# Patient Record
Sex: Male | Born: 1950
Health system: Southern US, Community
[De-identification: ages and names within clinical notes are randomized; demographics above are authoritative.]

## PROBLEM LIST (undated history)

## (undated) DIAGNOSIS — M1990 Unspecified osteoarthritis, unspecified site: Secondary | ICD-10-CM

## (undated) DIAGNOSIS — I639 Cerebral infarction, unspecified: Secondary | ICD-10-CM

## (undated) DIAGNOSIS — E785 Hyperlipidemia, unspecified: Secondary | ICD-10-CM

## (undated) DIAGNOSIS — H409 Unspecified glaucoma: Secondary | ICD-10-CM

## (undated) HISTORY — PX: BACK SURGERY: SHX140

## (undated) HISTORY — PX: VASECTOMY: SHX75

## (undated) HISTORY — DX: Cerebral infarction, unspecified: I63.9

## (undated) HISTORY — DX: Hyperlipidemia, unspecified: E78.5

## (undated) HISTORY — PX: INGUINAL HERNIA REPAIR: SUR1180

---

## 2001-06-01 ENCOUNTER — Encounter: Payer: Self-pay | Admitting: Family Medicine

## 2001-06-01 ENCOUNTER — Ambulatory Visit (HOSPITAL_COMMUNITY): Admission: RE | Admit: 2001-06-01 | Discharge: 2001-06-01 | Payer: Self-pay | Admitting: Family Medicine

## 2001-08-08 ENCOUNTER — Encounter: Payer: Self-pay | Admitting: Emergency Medicine

## 2001-08-08 ENCOUNTER — Emergency Department (HOSPITAL_COMMUNITY): Admission: EM | Admit: 2001-08-08 | Discharge: 2001-08-08 | Payer: Self-pay | Admitting: Emergency Medicine

## 2007-09-20 ENCOUNTER — Ambulatory Visit (HOSPITAL_COMMUNITY): Admission: RE | Admit: 2007-09-20 | Discharge: 2007-09-20 | Payer: Self-pay | Admitting: General Surgery

## 2007-09-20 ENCOUNTER — Encounter (INDEPENDENT_AMBULATORY_CARE_PROVIDER_SITE_OTHER): Payer: Self-pay | Admitting: General Surgery

## 2007-10-04 ENCOUNTER — Ambulatory Visit (HOSPITAL_COMMUNITY): Admission: RE | Admit: 2007-10-04 | Discharge: 2007-10-04 | Payer: Self-pay | Admitting: General Surgery

## 2007-10-04 ENCOUNTER — Encounter (INDEPENDENT_AMBULATORY_CARE_PROVIDER_SITE_OTHER): Payer: Self-pay | Admitting: General Surgery

## 2010-07-30 NOTE — Op Note (Signed)
NAMEJAVARIAN, Ronnie Walker                 ACCOUNT NO.:  [id number]   MEDICAL RECORD NO.:  [id number]          PATIENT TYPE:  AMB   LOCATION:  DAY                           FACILITY:  [facility]   PHYSICIAN:  Tilford Pillar, MD      DATE OF BIRTH:  [birth year]   DATE OF PROCEDURE:  09/20/2007  DATE OF DISCHARGE:  09/20/2007                               OPERATIVE REPORT   PREOPERATIVE DIAGNOSES:  1. Right inguinal hernia.  2. Sebaceous cyst of the scalp.   POSTOPERATIVE DIAGNOSES:  1. Right inguinal hernia.  2. Sebaceous cyst of the scalp.  3. Right-sided hydrocele.   PROCEDURES:  1. Right inguinal hernia repair with mesh.  2. Hydrocelectomy.  3. Excision of a sebaceous cyst of the scalp with a 3-cm incision.   SURGEON:  Tilford Pillar, MD.   ANESTHESIA:  General endotracheal local anesthetic, 1% lidocaine plain.   SPECIMEN:  Sebaceous cyst of the scalp.   ESTIMATED BLOOD LOSS:  Minimal.   INDICATIONS:  The patient is a 60 year old presented to my office with  referral for 2 separate reasons, one being a sebaceous cyst of the scalp  that has been present for several years and is slowly increased in size  causing him notable nodule on the scalp and some local discomfort.  He  also noted increasing right inguinal hernia discomfort and a bulge in  this area.  He had also noted increased size of his right scrotum.  He  was evaluated __________sebaceous cyst and the right inguinal hernia.  Risks, benefits, and alternatives as well as care were discussed at  length with the patient, risk including but not limited to risk of  infection, bleeding, mesh infection, requiring removal of the mesh as  well as the possibility of recurrence of both the hernia as well as the  possibility of recurrence of the cyst were discussed at length with the  patient.  The questions and concerns were addressed, and the patient was  consented for the planned procedure.   OPERATION:  The patient was taken to the  operating room, was placed in a  supine position on the operating table, at which time he was given  general anesthetic.  When the patient was asleep, he was endotracheally  intubated by anesthesia.  At this point, his groin was prepped and  draped in usual fashion for hernia repair.  Skin incision was created on  the right groin with a scalpel.  This was dissected down to the  subcuticular tissue was carried out using electrocautery including the  division of Scarpa fascia.  On reaching the external oblique fascia, a  notable bulge was encountered and the fascia was scored with a scalpel  and then the external oblique fascia was opened medially towards the  external inguinal vein with Metzenbaum scissors, taking care not to  injure the ilioinguinal nerve during the dissection.  At this point, the  sizable mass was noted in the inguinal canal.  The cord structures were  freed from its surrounding canal with blunt mutual dissection by  creating a window behind  the cord structures.  It was noted that the  hernia sac appeared in the floor consistent with a direct hernia.  Based  on these findings, we continued to evaluate the cord structures to  evaluate the possibility of an indirect component and the dissection  continued.  I did continue down towards his scrotum where there was a  sizeable apparent mass.  This was suspected to be a continuation of a  possible indirect component of the hernia; however, with continued  dissection, I was able to deliver a large hydrocele in the right pelvic  floor into the field.  I did open the hydrocele at this point dividing  and freeing up tissue, which enabled me to continue with and then pex  the lining of the hydrocele back upon itself with a 4-0 chromic suture  in a running locking continuous fashion.  At this point, the testicle  was inspected, although it was free, it was clearly viable with an  excellent blood supply and this was replaced back into  the scrotum with  proper orientation, and at this time, I continued evaluation around the  cord structures.  I did not identify indirect hernia component,  therefore, I turned my attentions to repair of the direct hernia  component.  Using the mesh and plug, the __________ reduced the hernia.  This was placed over the reduced hernia.   It was packed in several positions with a 2-0 Novafil suture and then a  mesh overlay was placed using a 2-0 Novafil suture to secure this  medially above the tubercle, superiorly to the conjoint tendon,  inferiorly to the second portion of the Cooper ligament and the tails  were tucked medially beneath the external oblique fascia.  The old  defect was reapproximated using a 2-0 Novafil suture snug, this was  placed snugly around the cord which did not reduce any of the  vasculature close to this area.  At this point, the wound was irrigated.  A 2-0 Vicryl in a running continuous fashion was utilized to  reapproximate the external oblique fascia, local anesthetic was then  instilled and then a 3-0 Vicryl was utilized to reapproximate the skin  edges in the running continuous fashion, reapproximate the Scarpa fascia  in running continuous fashion and a 4-0 Monocryl was utilized to  reapproximate the skin edges in a running subcuticular suture.  Skin was  washed and dried with a moist and dry towel.  Benzoin was applied.  Half-  inch Steri-Strips were placed over the incision.  At this point, the  drapes were removed and the patient was repositioned and his scalp was  prepped with Betadine solution.  I regowned and regloved  at this point  and then using a scalpel, an elliptical incision over the cyst with  combination of sharp and bipolar dissection was utilized to dissect down  around the cyst.  This was removed in its entirety, did reach down to  just over the bone but no bony defect was identified.  At this point,  hemostasis was obtained using  electrocautery.  The wound was irrigated  and then a 3-0 Prolene suture was applied in a simple interrupted  fashion to reapproximate the skin edges.  Hemostasis was excellent.  Betadine ointment was placed over the suture line and a 2 x 2 gauze  dressing was placed over the incision and secured as best as possible  with a Medipore tape for temporary compression.  At this point, all  drapes were removed.  The patient was allowed to come out of the general  anesthetic.  He was transferred back to regular hospital bed and  transferred from Postanesthetic Care Unit in stable condition.  At the  conclusion of the procedure, all instrument, sponge, and needle counts  were correct.  The patient tolerated the procedure well.      Tilford Pillar, MD  Electronically Signed     BZ/MEDQ  D:  09/21/2007  T:  09/22/2007  Job:  [id number]   cc:   Lorin Picket A. Gerda Diss, MD  Fax: [phone]

## 2010-07-30 NOTE — H&P (Signed)
NAME:  Ronnie Walker, CRITE NO.:  [id number]   MEDICAL RECORD NO.:  [id number]          PATIENT TYPE:  AMB   LOCATION:  DAY                           FACILITY:  [facility]   PHYSICIAN:  Tilford Pillar, MD      DATE OF BIRTH:  Jan 21, 1951   DATE OF ADMISSION:  DATE OF DISCHARGE:  LH                              HISTORY & PHYSICAL   CHIEF COMPLAINT:  Status post scalp cyst.   HISTORY OF PRESENT ILLNESS:  Patient is a 60 year old male who presented  to my office status post recent right inguinal hernia repair and  excision of a scalp cyst, both these proceeding on September 20, 2007.  At  this point, he does still complain of some right inguinal regional pain  associated with his recent hernia repair.  He has had no drainage and no  significant scrotal swelling or ecchymosis.  He has had no complaints in  the region of the scalp cyst excision.  He has resumed a normal diet.  He has had no recent change in medical status.   Please see the patient's previous history and physical for his complete  past medical, past surgical, medications, and social history.   PAST MEDICAL:  None.   PAST SURGICAL HISTORY:  Low back surgery.   MEDICATIONS:  No current medications.   ALLERGIES:  No known drug allergies.   SOCIAL HISTORY:  No tobacco.  Alcohol approximately 12 drinks per week.  Occupation, works at [employer].  There is heavy lifting involved.   PERTINENT FAMILY HISTORY:  Coronary artery disease.   REVIEW OF SYSTEMS:  Please see the previous history and physical for  complete reference.  Review of systems is unremarkable on all body  systems except musculoskeletal with neck, back, and joint pain.   PHYSICAL EXAMINATION:  On physical exam, the patient is a healthy-  appearing male in no acute distress.  He is alert and oriented x3.  HEENT:  His scalp incision is healing well.  The Prolene sutures  persists.  There is no erythema or drainage noted in this area.  EYES:  Pupils were  equal, round, and reactive.  Extraocular movements  are intact.  No conjunctival pallor is noted.  NECK:  No cervical lymphadenopathy noted.  Trachea is midline.  ABDOMINAL EXAM:  Abdomen is soft.  Bowel sounds are present.  He is  tender along the right groin.  The incision is well-healed.  He does  have mild swelling in the right side of the scrotum.  There is no  ecchymosis noted over this area.  EXTREMITIES:  Warm and dry.   PERTINENT LABORATORY, RADIOGRAPHIC, AND PATHOLOGY RESULTS:  Pathologic  evaluation of his excised cyst from the scalp demonstrated trichilemmal  carcinoma with positive margins.  As per report, the cyst has a pilar  differentiation, but the cyst wall has degraded in some areas with  carcinomatous changes.  There is keratinization, which focally has a  trichilemmal appearance.  There is desmoplastic stroma with infiltration  of tumor cells between the mesenchymal elements.  The carcinomatous  component  does extend to the margins.   ASSESSMENT AND PLAN:  1. Carcinoma of the scalp.  2. Right inguinal hernia.  At this point, the patient does have a new      problem as the benign-appearing cyst did come back positive for      carcinoma, which was unexpected at the time of his initial cyst      excision.  Based on this, I did have a discussion with the patient      and the patient's wife in regards to these findings.  I did discuss      with the patient that this is an extremely rare form of carcinoma;      however, it does require additional obtaining of tissue due to the      positive margins.  Literature was also provided for the patient,      which had been retrieved from online and was discussed with the      patient that chemotherapy or radiation would be unlikely.  She      would proceed with excision for clear margins.  At this point, I      did discuss with the patient I feel there is little benefit for      obtaining frozen sections on this as this is on  the scalp.  There      is only limited region at this point for additional tissue.  On      evaluation in the office, there was enough playwith the surrounding      tissue that I feel additional tissue biopsy will be possible and      additional excision around the previous incision should provide      adequate margins, however, should the resulting path come back      again positive, at this point, the additional intervention will      likely require the assistance of a plastic surgeon for some form of      coverage over this area.  This was discussed with the patient and      at this point, we will plan to proceed with simple wide excision      with the understanding that should markings come back again      positive that referral to a plastic surgeon for additional tissue      and tissue coverage will be required.  The patient understands the      risks, benefits, and alternatives, and does wish to proceed at this      point.  We will schedule the patient as soon as possible for      additional tissue excision.       Tilford Pillar, MD  Electronically Signed     BZ/MEDQ  D:  09/28/2007  T:  09/29/2007  Job:  [id number]   cc:   Lorin Picket A. Gerda Diss, MD  Fax: [phone]   Short Stay Surgery

## 2010-07-30 NOTE — Op Note (Signed)
Ronnie Walker, Ronnie Walker                 ACCOUNT NO.:  [id number]   MEDICAL RECORD NO.:  [id number]          PATIENT TYPE:  AMB   LOCATION:  DAY                           FACILITY:  [facility]   PHYSICIAN:  Tilford Pillar, MD      DATE OF BIRTH:  06/10/1950   DATE OF PROCEDURE:  DATE OF DISCHARGE:                               OPERATIVE REPORT   PREOPERATIVE DIAGNOSIS:  In situ carcinoma of the positive margin.   POSTOPERATIVE DIAGNOSIS:  In situ carcinoma of the positive margin.   PROCEDURE:  Wide excision of scalp carcinoma via 1.5 cm incision, due to  elevation of a relaxing incision.   SURGEON:  Tilford Pillar, M.D.   ANESTHESIA:  Sensorcaine 1%  with epinephrine.   SPECIMENS:  Skin margin.   ESTIMATED BLOOD LOSS:  Less than 50 mL.   INDICATIONS:  The patient is an unfortunate 60 year old male who  previously had a what appeared a sebaceous cyst of the scalp, excised  and right inguinal hernia repair.  Both these operations went extremely  well with no difficulties.  No complications.  Following the repair and  the cyst excision, which was in the office, final pathology, which came  back positive for skin carcinoma.  This was discussed with the patient  at his outpatient visit the following week, and was discussed with the  patient the need for additional skin removals.  Due to the location, it  was discussed with the patient that the additional tissue to be  obtained.  However, if positive margins then came back positive,  additional skin removal would require likely flap or grafting for  coverage.  The patient understood the risk, benefits, and alternatives,  consent was obtained.   OPERATION:  The patient was taken to the operating room.  He was placed  in the supine position on the operating table.  General anesthetic was  administered.  Once the patient was asleep, he was endotracheally  intubated by anesthesia had been placed.  Scalp prepped with DuraPrep  solution.  He was  positioned into a somewhat seated position.  Sterile  dressing was placed in standard fashion.  The elliptical incision was  created around the previous placed Prolene sutures, this was  approximately 1.5 x 1.5 cm elliptical incision.  It was then taken down  just to the subcuticular tissue.  The tissue was removed and sent  specimen to Pathology.  Hemostasis obtained using electrocautery and  then additional local anesthetic was instilled.  Wound was irrigated.  Our attention was then turned to closure.   Using 3-0 nylon suture, the skin edges were reapproximated in a  __________ fashion.  In the midportion of the incision, there was a  quite a bit of tension, this is on the closure, therefore a relaxation  incision was created just lateral to the right of the incision through  the epidermis.  This allowed enough relaxation to precisely close the  surgical site at which time neosporin or antibiotic ointment was placed  over the suture line.  Over the relaxation incision, Dermabond was  placed  to create a seal.  Skin was then washed and dried with a moist  and dry towel.  Then  drapes were removed.  The patient was allowed to come out of general  anesthetic, was transferred back to the Post-Anesthetic Care Unit in  stable condition. At the conclusion of the procedure, all instrument,  sponge, and needle counts were correct. The patient tolerated the  procedure well.      Tilford Pillar, MD  Electronically Signed     BZ/MEDQ  D:  10/04/2007  T:  10/05/2007  Job:  [id number]   cc:   Lorin Picket A. Gerda Diss, MD  Fax: [phone]

## 2010-07-30 NOTE — H&P (Signed)
NAME:  Ronnie Walker, Ronnie Walker NO.:  [id number]   MEDICAL RECORD NO.:  [id number]          PATIENT TYPE:  AMB   LOCATION:  DAY                           FACILITY:  [facility]   PHYSICIAN:  Tilford Pillar, MD      DATE OF BIRTH:  26-Jan-1951   DATE OF ADMISSION:  DATE OF DISCHARGE:  LH                              HISTORY & PHYSICAL   CHIEF COMPLAINT:  Hernia and a cyst.   HISTORY OF PRESENT ILLNESS:  The patient is a 60 year old gentleman who  was referred to my office by Dr. Gerda Diss with two separate issues.  The  first issue of which has been present for sometime.  He has noted a  sizeable nodule on his scalp near the posterior aspect of his head,  several years, but has slowly increased in size.  He has never had any  drainage from this.  He has had no episodes of erythema.  No significant  pain.  It did not change significantly, he has never had any other  similar lesions.  The second issue, which he was referred was for  increasing right groin pain.  He has noted this approximately 3-4 months  ago and had increasing discomfort in the right groin with radiation down  to the right scrotum.  Based on this, the patient was evaluated by his  primary physician.  He did notice a large bulge in this area, which was  grown considerably in size.  He has had no erythema.  No severe constant  pain.  He has had no change in bowel habits.  No nausea and vomiting.  No fever and chills.  No melena.  No dysuria.  No history of prior  hernia.   PAST MEDICAL HISTORY:  None.   PAST SURGICAL HISTORY:  He has had lower back surgeries.   MEDICATIONS:  He is not currently on any medications.   ALLERGIES:  No known drug allergies.   SOCIAL HISTORY:  No tobacco.  He drinks approximately 12 beers per week.  Occupation:  He does work at [employer] and it does involve heavy  lifting.   FAMILY HISTORY:  Heart disease.   REVIEW OF SYSTEMS:  CONSTITUTIONAL:  Unremarkable.  EYES:   Unremarkable.  EARS, NOSE, AND THROAT:  Unremarkable.  RESPIRATORY:  Unremarkable.  CARDIOVASCULAR:  Unremarkable.  GASTROINTESTINAL:  Unremarkable.  GENITOURINARY:  Unremarkable.  MUSCULOSKELETAL:  Neck, back, and joint  arthralgias, otherwise unremarkable.  SKIN:  As per HPI, otherwise  unremarkable.  ENDOCRINE:  Unremarkable.  NEURO:  Unremarkable.   PHYSICAL EXAMINATION:  The patient is a calm individual in no acute  distress.  He is alert and oriented x3.  HEENT:  Scalp:  He does have a round palpable mobile nodule on his  scalp.  This is slightly to the left and posterior in the parietal  region and is nontender to palpation.  No discharges noted.  Does not  appear attached to the underlying muscle or skull.  Eyes, pupils equal,  round, and reactive.  Extraocular movements are intact.  No scleral  icterus  or conjunctival pallor is noted.  Trachea is midline.  No  cervical lymphadenopathy is appreciated.  PULMONARY:  Unlabored respirations.  He is clear to auscultation.  CARDIOVASCULAR:  Regular rate and rhythm.  No murmurs, no gallops.  He  has 2+ radial and dorsalis pedis pulses bilaterally.  ABDOMEN:  Bowel  sounds are present.  Abdomen is soft, nontender.  He does have a small  umbilical hernia.  This is easily reducible as well as an extremely  large right inguinal hernia.  This does have incarcerated hernia sac due  to the size.  This is likely a chronic finding.  This is all the way  into his scrotum and actually pushes towards the left side of the  scrotum.  He does have a distended left testicle which is easily  palpated, although no right testicle is palpable due to the presence of  the extremely large hernia.  In fact, the palpable testicle may actually  be his right testicle displaced to the left side.  Otherwise, normal  appearing external male genitalia.  SKIN:  Warm and dry.   ASSESSMENT AND PLAN:  1. Scalp sebaceous cyst.  2. Umbilical hernia.  3. Right inguinal  hernia, suspected chronic incarceration.  At this      point, I did discuss with the patient options.  I do not believe      that the sebaceous cyst is an emergency by any means.  However we      are going to proceed as recommended for the right inguinal hernia.      This may be something to approach at the same time with simple      excision.  The risks, benefits, and alternatives, which were      discussed at length with the patient and the patient's questions      and concerns were addressed in regard to this.  In regards to the      umbilical hernia, this is quite small and at this time we would      continue to keep a close eye on and monitor, although I would not      recommend at this time proceeding with repair unless a counter      incision is needed for the right inguinal hernia.  If a midline      infraumbilical skin incision is made for a counter incision for the      reduction and repair of the right inguinal hernia, I did discuss      with the patient that I would plan on repairing the umbilical      hernia at that time.  Additionally, in regards to the right      inguinal hernia due to its extremely large size, I do suspect this      is likely being present for several months at least.  I did discuss      with the patient at length the signs and symptoms associated with      strangulation and he is aware that should these symptoms occur he      is to proceed immediately to the emergency department.  At this      time, I do recommend urgent repair.  It was discussed with the      patient the risks, benefits, alternatives of a repair including,      but not limited to risk of bleeding, infection, mesh infection      requiring removal of mesh, as  well as possibility of bowel injury,      and as well as testicular loss.  Due to the large size, this does      increase the risk of paresthesia and nerve entrapment as well as      the possibility of disruption of the blood supply to  the right      testicle.  This will likely need a counter incision due to the      large size, and should any bowel viability be question, bowel      resection will take precedence over repair of the hernia which in      such a case I would plan utilizing a biological mesh rather than a      synthetic mesh for the repair with all anticipation of the need to      return in the future upon recovery for definitive repair of the      hernia.  However, at this time I did discuss with the patient at      length a right inguinal herniorrhaphy with mesh.  At this point, we      will schedule this at the patient's earliest convenience again      stressing that this is a relatively urgent procedure.  We will      proceed at this time.      Tilford Pillar, MD  Electronically Signed    BZ/MEDQ  D:  09/09/2007  T:  09/10/2007  Job:  [id number]   cc:   Lorin Picket A. Gerda Diss, MD  Fax: [phone]   Same Day Surgery

## 2010-12-12 LAB — BASIC METABOLIC PANEL
BUN: 7
CO2: 30
Calcium: 9.5
Chloride: 102
Creatinine, Ser: 0.91
GFR calc Af Amer: >60
GFR calc non Af Amer: >60
Glucose, Bld: 101 — ABNORMAL HIGH
Potassium: 4.3
Sodium: 138

## 2010-12-12 LAB — CBC
HCT: 43.3
Hemoglobin: 14.8
MCHC: 34.3
MCV: 89.9
Platelets: 191
RBC: 4.81
RDW: 12.7
WBC: 5.8

## 2013-01-11 ENCOUNTER — Encounter: Payer: Self-pay | Admitting: Family Medicine

## 2013-01-11 ENCOUNTER — Ambulatory Visit (INDEPENDENT_AMBULATORY_CARE_PROVIDER_SITE_OTHER): Payer: BC Managed Care – PPO | Admitting: Family Medicine

## 2013-01-11 VITALS — BP 120/84 | Temp 98.4°F | Ht 65.25 in | Wt 153.5 lb

## 2013-01-11 DIAGNOSIS — R109 Unspecified abdominal pain: Secondary | ICD-10-CM

## 2013-01-11 DIAGNOSIS — H9203 Otalgia, bilateral: Secondary | ICD-10-CM

## 2013-01-11 DIAGNOSIS — Z79899 Other long term (current) drug therapy: Secondary | ICD-10-CM

## 2013-01-11 DIAGNOSIS — H9209 Otalgia, unspecified ear: Secondary | ICD-10-CM

## 2013-01-11 DIAGNOSIS — Z125 Encounter for screening for malignant neoplasm of prostate: Secondary | ICD-10-CM

## 2013-01-11 DIAGNOSIS — E785 Hyperlipidemia, unspecified: Secondary | ICD-10-CM

## 2013-01-11 MED ORDER — NEOMYCIN-POLYMYXIN-HC 3.5-10000-1 OT SOLN: 3.0000 [drp] | Freq: Four times a day (QID) | OTIC | Status: AC

## 2013-01-11 NOTE — Progress Notes (Signed)
  Subjective:    Patient ID: Ronnie Walker, male    DOB: 31-Oct-1950, 62 y.o.   MRN: [id number]  Otalgia  There is pain in both ears. This is a new problem. The current episode started 1 to 4 weeks ago. The problem has been unchanged. There has been no fever. The pain is mild. He has tried nothing for the symptoms. The treatment provided no relief.  wears plugs at work Some sinus sx No fevers Has a history hyperlipidemia but denies any chest tightness pressure pain shortness of breath. Smoker-quit 10 years ago 30 pack year history at least Family history age 47 dad had ruptured aortic aneurysm. He  Review of Systems  HENT: Positive for ear pain.    see above. Also having to urinate at night    Objective:   Physical Exam Both your canals have cerumen buildup along with inflammation possible otitis externa neck no masses throat is normal lungs are clear no crackles heart regular abdomen soft no guarding or rebound extremities no edema       Assessment & Plan:  #1 patient has over 30-pack-year history of smoking although he did quit about 10 years ago he is having intermittent abdominal pain plus a family history of ruptured aortic aneurysm at age 19 I would recommend because of his abdominal discomforts ultrasound of his abdomen. He is going to do a colonoscopy I gave him the sheet to help get this set up. #2 has history hyperlipidemia we'll check lab work he needs followup for a wellness exam #3 otalgia-use drops as prescribed if ongoing troubles referral to ENT. Number for wellness exam in near future 25 minutes spent with patient.

## 2013-01-14 ENCOUNTER — Ambulatory Visit (HOSPITAL_COMMUNITY)
Admission: RE | Admit: 2013-01-14 | Discharge: 2013-01-14 | Disposition: A | Discharge status: Home / Self Care | Payer: BC Managed Care – PPO | Source: Ambulatory Visit | Attending: Family Medicine | Admitting: Family Medicine

## 2013-01-14 DIAGNOSIS — K7689 Other specified diseases of liver: Secondary | ICD-10-CM | POA: Insufficient documentation

## 2013-01-14 DIAGNOSIS — R109 Unspecified abdominal pain: Secondary | ICD-10-CM | POA: Insufficient documentation

## 2013-01-24 LAB — LIPID PANEL
Cholesterol: 243 mg/dL — ABNORMAL HIGH (ref 0–200)
HDL: 52 mg/dL (ref >39)
LDL Cholesterol: 165 mg/dL — ABNORMAL HIGH (ref 0–99)
Total CHOL/HDL Ratio: 4.7 Ratio
Triglycerides: 129 mg/dL (ref <150)
VLDL: 26 mg/dL (ref 0–40)

## 2013-01-24 LAB — HEPATIC FUNCTION PANEL
ALT: 19 U/L (ref 0–53)
AST: 15 U/L (ref 0–37)
Albumin: 3.9 g/dL (ref 3.5–5.2)
Alkaline Phosphatase: 77 U/L (ref 39–117)
Bilirubin, Direct: 0.1 mg/dL (ref 0.0–0.3)
Indirect Bilirubin: 0.3 mg/dL (ref 0.0–0.9)
Total Bilirubin: 0.4 mg/dL (ref 0.3–1.2)
Total Protein: 6.4 g/dL (ref 6.0–8.3)

## 2013-01-24 LAB — BASIC METABOLIC PANEL
BUN: 14 mg/dL (ref 6–23)
CO2: 29 mEq/L (ref 19–32)
Calcium: 8.8 mg/dL (ref 8.4–10.5)
Chloride: 102 mEq/L (ref 96–112)
Creat: 0.82 mg/dL (ref 0.50–1.35)
Glucose, Bld: 104 mg/dL — ABNORMAL HIGH (ref 70–99)
Potassium: 4.3 mEq/L (ref 3.5–5.3)
Sodium: 138 mEq/L (ref 135–145)

## 2013-01-25 LAB — PSA: PSA: 0.44 ng/mL (ref <=4.00)

## 2013-01-26 ENCOUNTER — Encounter: Payer: Self-pay | Admitting: Family Medicine

## 2013-02-04 ENCOUNTER — Encounter: Payer: Self-pay | Admitting: Family Medicine

## 2013-02-04 ENCOUNTER — Ambulatory Visit (INDEPENDENT_AMBULATORY_CARE_PROVIDER_SITE_OTHER): Payer: BC Managed Care – PPO | Admitting: Family Medicine

## 2013-02-04 ENCOUNTER — Ambulatory Visit (HOSPITAL_COMMUNITY)
Admission: RE | Admit: 2013-02-04 | Discharge: 2013-02-04 | Disposition: A | Discharge status: Home / Self Care | Payer: BC Managed Care – PPO | Source: Ambulatory Visit | Attending: Family Medicine | Admitting: Family Medicine

## 2013-02-04 VITALS — BP 110/78 | Ht 66.0 in | Wt 157.0 lb

## 2013-02-04 DIAGNOSIS — X500XXA Overexertion from strenuous movement or load, initial encounter: Secondary | ICD-10-CM | POA: Insufficient documentation

## 2013-02-04 DIAGNOSIS — M25579 Pain in unspecified ankle and joints of unspecified foot: Secondary | ICD-10-CM | POA: Insufficient documentation

## 2013-02-04 DIAGNOSIS — S8990XA Unspecified injury of unspecified lower leg, initial encounter: Secondary | ICD-10-CM | POA: Insufficient documentation

## 2013-02-04 DIAGNOSIS — M25571 Pain in right ankle and joints of right foot: Secondary | ICD-10-CM

## 2013-02-04 DIAGNOSIS — S99919A Unspecified injury of unspecified ankle, initial encounter: Secondary | ICD-10-CM | POA: Insufficient documentation

## 2013-02-04 DIAGNOSIS — S93409A Sprain of unspecified ligament of unspecified ankle, initial encounter: Secondary | ICD-10-CM

## 2013-02-04 NOTE — Patient Instructions (Signed)
Three 200 mg ibuprofen tablets

## 2013-02-04 NOTE — Progress Notes (Signed)
  Subjective:    Patient ID: Selinda Flavin, male    DOB: [birth year], 62 y.o.   MRN: [id number]  Ankle Pain  The incident occurred 12 to 24 hours ago. The incident occurred at home. The injury mechanism was a fall. The pain is present in the right ankle and right leg. He has tried elevation for the symptoms.   No  Prior hx of injury  Swelled up and lhurting Has not use much over-the-counter yet. Thought he felt a popping sensation. Now notes some swelling.    Review of Systems  no ROS otherwise negative knee pain no leg pain no history of ankle injury  Physical exam alert hydration good. Lungs clear. Heart regular in rhythm. Lateral ankle soft tissue swelling evident. Positive tenderness over malleolus. Good range of motion. Sensation and pulses intact.   Impression likely ankle sprain doubt fracture though always possible with this type of injury. Plan sent for x-ray. Of note x-ray negative discussed with patient. Local measures discussed. Anti-inflammatory medicine when necessary. WSL     alert   Physical Exam        Assessment & Plan:

## 2013-02-08 ENCOUNTER — Encounter: Payer: Self-pay | Admitting: *Deleted

## 2013-02-08 DIAGNOSIS — Z0289 Encounter for other administrative examinations: Secondary | ICD-10-CM

## 2013-02-16 ENCOUNTER — Ambulatory Visit (INDEPENDENT_AMBULATORY_CARE_PROVIDER_SITE_OTHER): Payer: BC Managed Care – PPO | Admitting: Family Medicine

## 2013-02-16 ENCOUNTER — Encounter: Payer: Self-pay | Admitting: Family Medicine

## 2013-02-16 VITALS — BP 118/70 | Ht 65.0 in | Wt 158.6 lb

## 2013-02-16 DIAGNOSIS — R7309 Other abnormal glucose: Secondary | ICD-10-CM

## 2013-02-16 DIAGNOSIS — Z0000 Encounter for general adult medical examination without abnormal findings: Secondary | ICD-10-CM

## 2013-02-16 DIAGNOSIS — K4090 Unilateral inguinal hernia, without obstruction or gangrene, not specified as recurrent: Secondary | ICD-10-CM

## 2013-02-16 DIAGNOSIS — R739 Hyperglycemia, unspecified: Secondary | ICD-10-CM

## 2013-02-16 DIAGNOSIS — E785 Hyperlipidemia, unspecified: Secondary | ICD-10-CM | POA: Insufficient documentation

## 2013-02-16 LAB — POCT GLYCOSYLATED HEMOGLOBIN (HGB A1C): Hemoglobin A1C: 5.9

## 2013-02-16 NOTE — Progress Notes (Signed)
   Subjective:    Patient ID: Ronnie Walker, male    DOB: [birth year], 62 y.o.   MRN: [id number]  HPI Patient arrives for an annual physical.  Patient would like to have right inguinal hernia checked-pulls at times. The patient comes in today for a wellness visit.  A review of their health history was completed.  A review of medications was also completed. Any necessary refills were discussed. Sensible healthy diet was discussed. Importance of minimizing excessive salt and carbohydrates was also discussed. Safety was stressed including driving, activities at work and at home where applicable. Importance of regular physical activity for overall health was discussed. Preventative measures appropriate for age were discussed. Time was spent with the patient discussing any concerns they have about their well-being.  Time was spent with the patient also discussing the importance of colonoscopy also discussing his lab work including a mild elevation of cholesterol and borderline glucose findings.  Review of Systems  Constitutional: Negative for fever, activity change and appetite change.  HENT: Negative for congestion and rhinorrhea.   Eyes: Negative for discharge.  Respiratory: Negative for cough and wheezing.   Cardiovascular: Negative for chest pain.  Gastrointestinal: Negative for vomiting, abdominal pain and blood in stool.  Genitourinary: Negative for frequency and difficulty urinating.  Musculoskeletal: Negative for neck pain.  Skin: Negative for rash.  Allergic/Immunologic: Negative for environmental allergies and food allergies.  Neurological: Negative for weakness and headaches.  Psychiatric/Behavioral: Negative for agitation.       Objective:   Physical Exam  Constitutional: He appears well-developed and well-nourished.  HENT:  Head: Normocephalic and atraumatic.  Right Ear: External ear normal.  Left Ear: External ear normal.  Nose: Nose normal.  Mouth/Throat: Oropharynx  is clear and moist.  Eyes: EOM are normal. Pupils are equal, round, and reactive to light.  Neck: Normal range of motion. Neck supple. No thyromegaly present.  Cardiovascular: Normal rate, regular rhythm and normal heart sounds.   No murmur heard. Pulmonary/Chest: Effort normal and breath sounds normal. No respiratory distress. He has no wheezes.  Abdominal: Soft. Bowel sounds are normal. He exhibits no distension and no mass. There is no tenderness.  Genitourinary: Prostate normal and penis normal.  The patient either has very large hydroceles or has reoccurrence of hernias.  Musculoskeletal: Normal range of motion. He exhibits no edema.  Lymphadenopathy:    He has no cervical adenopathy.  Neurological: He is alert. He exhibits normal muscle tone.  Skin: Skin is warm and dry. No erythema.  Psychiatric: He has a normal mood and affect. His behavior is normal. Judgment normal.          Assessment & Plan:  #1 hyperglycemia-A1c looks good. Patient encouraged to minimize starches and sugars in the diet #2 wellness-safety dietary measures all discussed. #3 colonoscopy recommended patient will set up #4 followup in 6 months to check cholesterol profile along with hemoglobin A1c and fasting sugar

## 2013-02-16 NOTE — Patient Instructions (Signed)
Diabetes and Exercise Exercising regularly is important. It is not just about losing weight. It has many health benefits, such as:  Improving your overall fitness, flexibility, and endurance.  Increasing your bone density.  Helping with weight control.  Decreasing your body fat.  Increasing your muscle strength.  Reducing stress and tension.  Improving your overall health. People with diabetes who exercise gain additional benefits because exercise:  Reduces appetite.  Improves the body's use of blood sugar (glucose).  Helps lower or control blood glucose.  Decreases blood pressure.  Helps control blood lipids (such as cholesterol and triglycerides).  Improves the body's use of the hormone insulin by:  Increasing the body's insulin sensitivity.  Reducing the body's insulin needs.  Decreases the risk for heart disease because exercising:  Lowers cholesterol and triglycerides levels.  Increases the levels of good cholesterol (such as high-density lipoproteins [HDL]) in the body.  Lowers blood glucose levels. YOUR ACTIVITY PLAN  Choose an activity that you enjoy and set realistic goals. Your health care provider or diabetes educator can help you make an activity plan that works for you. You can break activities into 2 or 3 sessions throughout the day. Doing so is as good as one long session. Exercise ideas include:  Taking the dog for a walk.  Taking the stairs instead of the elevator.  Dancing to your favorite song.  Doing your favorite exercise with a friend. RECOMMENDATIONS FOR EXERCISING WITH TYPE 1 OR TYPE 2 DIABETES   Check your blood glucose before exercising. If blood glucose levels are greater than 240 mg/dL, check for urine ketones. Do not exercise if ketones are present.  Avoid injecting insulin into areas of the body that are going to be exercised. For example, avoid injecting insulin into:  The arms when playing tennis.  The legs when  jogging.  Keep a record of:  Food intake before and after you exercise.  Expected peak times of insulin action.  Blood glucose levels before and after you exercise.  The type and amount of exercise you have done.  Review your records with your health care provider. Your health care provider will help you to develop guidelines for adjusting food intake and insulin amounts before and after exercising.  If you take insulin or oral hypoglycemic agents, watch for signs and symptoms of hypoglycemia. They include:  Dizziness.  Shaking.  Sweating.  Chills.  Confusion.  Drink plenty of water while you exercise to prevent dehydration or heat stroke. Body water is lost during exercise and must be replaced.  Talk to your health care provider before starting an exercise program to make sure it is safe for you. Remember, almost any type of activity is better than none. Document Released: 05/24/2003 Document Revised: 11/03/2012 Document Reviewed: 08/10/2012 [facility] Patient Information 2014 [place], [place]. Cholesterol Cholesterol is a white, waxy, fat-like protein needed by your body in small amounts. The liver makes all the cholesterol you need. It is carried from the liver by the blood through the blood vessels. Deposits (plaque) may build up on blood vessel walls. This makes the arteries narrower and stiffer. Plaque increases the risk for heart attack and stroke. You cannot feel your cholesterol level even if it is very high. The only way to know is by a blood test to check your lipid (fats) levels. Once you know your cholesterol levels, you should keep a record of the test results. Work with your caregiver to to keep your levels in the desired range. WHAT THE  RESULTS MEAN:  Total cholesterol is a rough measure of all the cholesterol in your blood.  LDL is the so-called bad cholesterol. This is the type that deposits cholesterol in the walls of the arteries. You want this level to be  low.  HDL is the good cholesterol because it cleans the arteries and carries the LDL away. You want this level to be high.  Triglycerides are fat that the body can either burn for energy or store. High levels are closely linked to heart disease. DESIRED LEVELS:  Total cholesterol below 200.  LDL below 100 for people at risk, below 70 for very high risk.  HDL above 50 is good, above 60 is best.  Triglycerides below 150. HOW TO LOWER YOUR CHOLESTEROL:  Diet.  Choose fish or white meat chicken and [place], roasted or baked. Limit fatty cuts of red meat, fried foods, and processed meats, such as sausage and lunch meat.  Eat lots of fresh fruits and vegetables. Choose whole grains, beans, pasta, potatoes and cereals.  Use only small amounts of olive, corn or canola oils. Avoid butter, mayonnaise, shortening or palm kernel oils. Avoid foods with trans-fats.  Use skim/nonfat milk and low-fat/nonfat yogurt and cheeses. Avoid whole milk, cream, ice cream, egg yolks and cheeses. Healthy desserts include angel food cake, ginger snaps, animal crackers, hard candy, popsicles, and low-fat/nonfat frozen yogurt. Avoid pastries, cakes, pies and cookies.  Exercise.  A regular program helps decrease LDL and raises HDL.  Helps with weight control.  Do things that increase your activity level like gardening, walking, or taking the stairs.  Medication.  May be prescribed by your caregiver to help lowering cholesterol and the risk for heart disease.  You may need medicine even if your levels are normal if you have several risk factors. HOME CARE INSTRUCTIONS   Follow your diet and exercise programs as suggested by your caregiver.  Take medications as directed.  Have blood work done when your caregiver feels it is necessary. MAKE SURE YOU:   Understand these instructions.  Will watch your condition.  Will get help right away if you are not doing well or get worse. Document Released:  11/26/2000 Document Revised: 05/26/2011 Document Reviewed: 05/19/2007 [facility] Patient Information 2014 [place], [place].

## 2013-02-28 ENCOUNTER — Encounter: Payer: Self-pay | Admitting: Family Medicine

## 2015-01-18 IMAGING — CR DG ANKLE COMPLETE 3+V*R*
3 series · 3 of 3 positions shown · non-contrast
Comparison: No priors.

CLINICAL DATA: Right-sided ankle pain after a twisting injury last
night.

EXAM:
RIGHT ANKLE - COMPLETE 3+ VIEW

[view not recorded (1 of 3)]
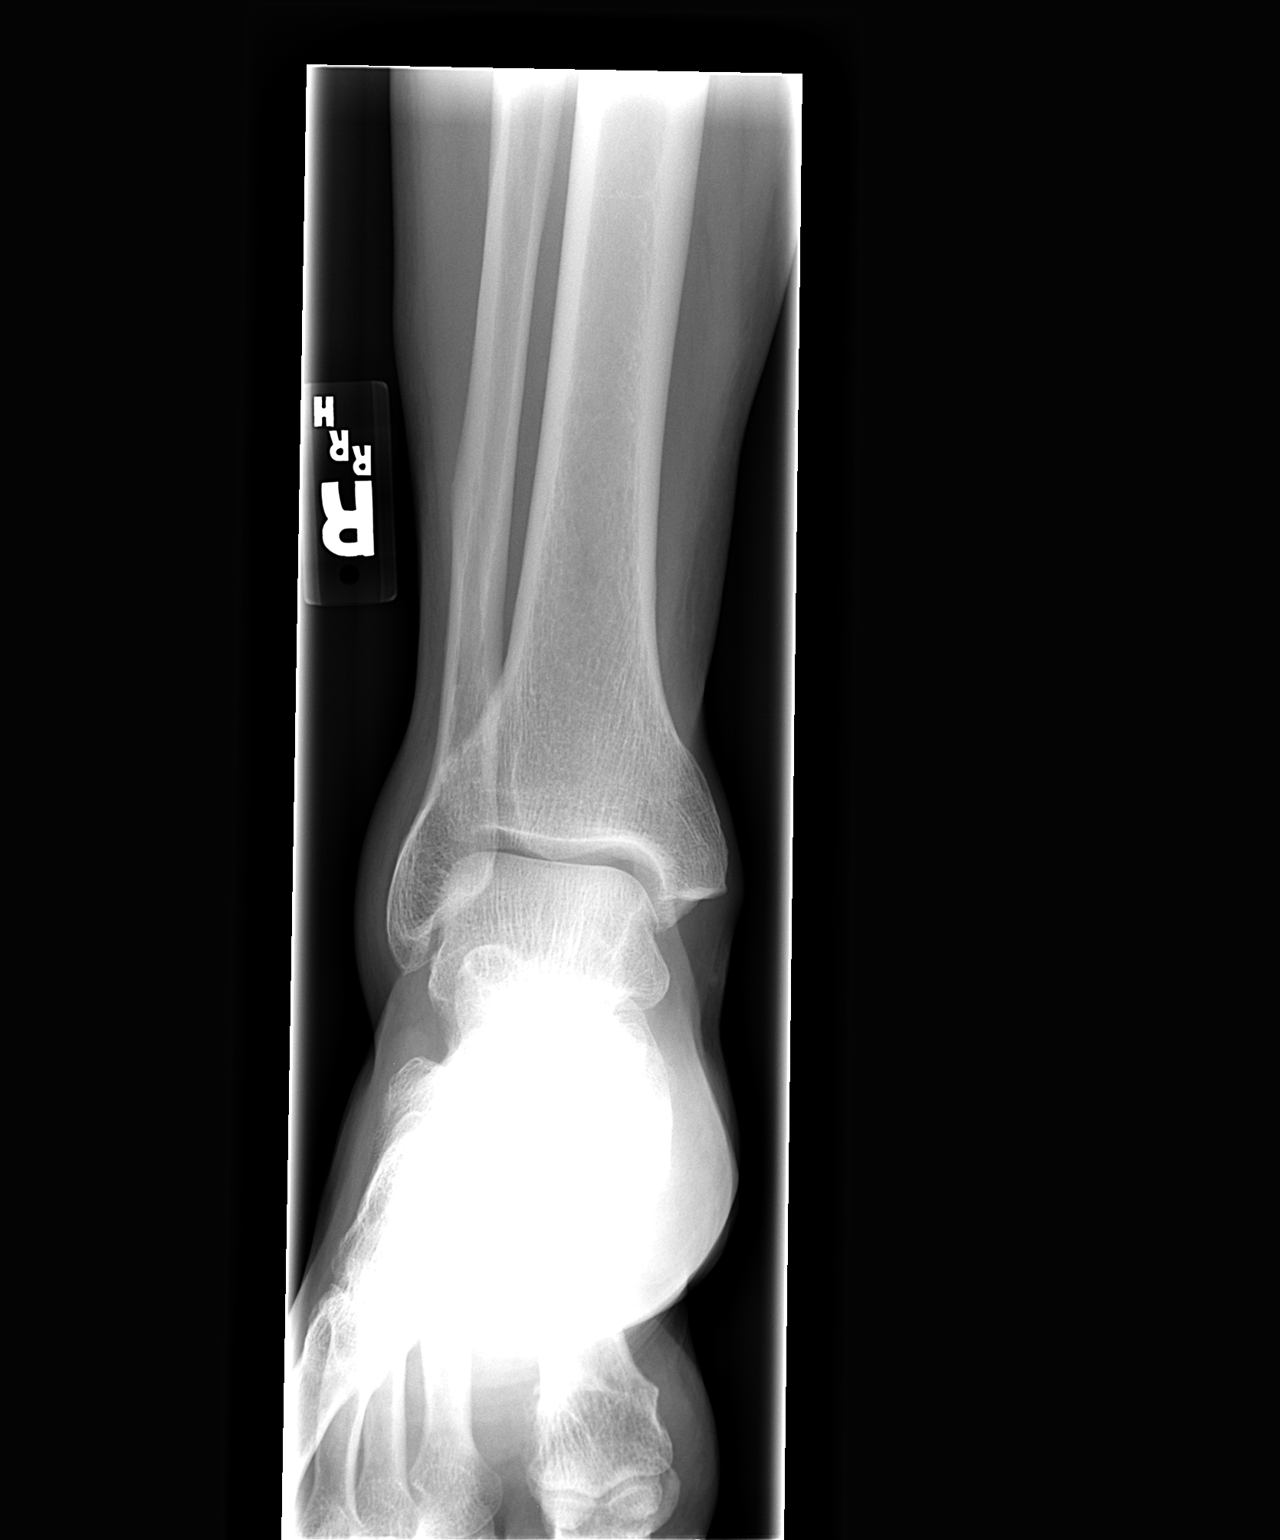

[view not recorded (2 of 3)]
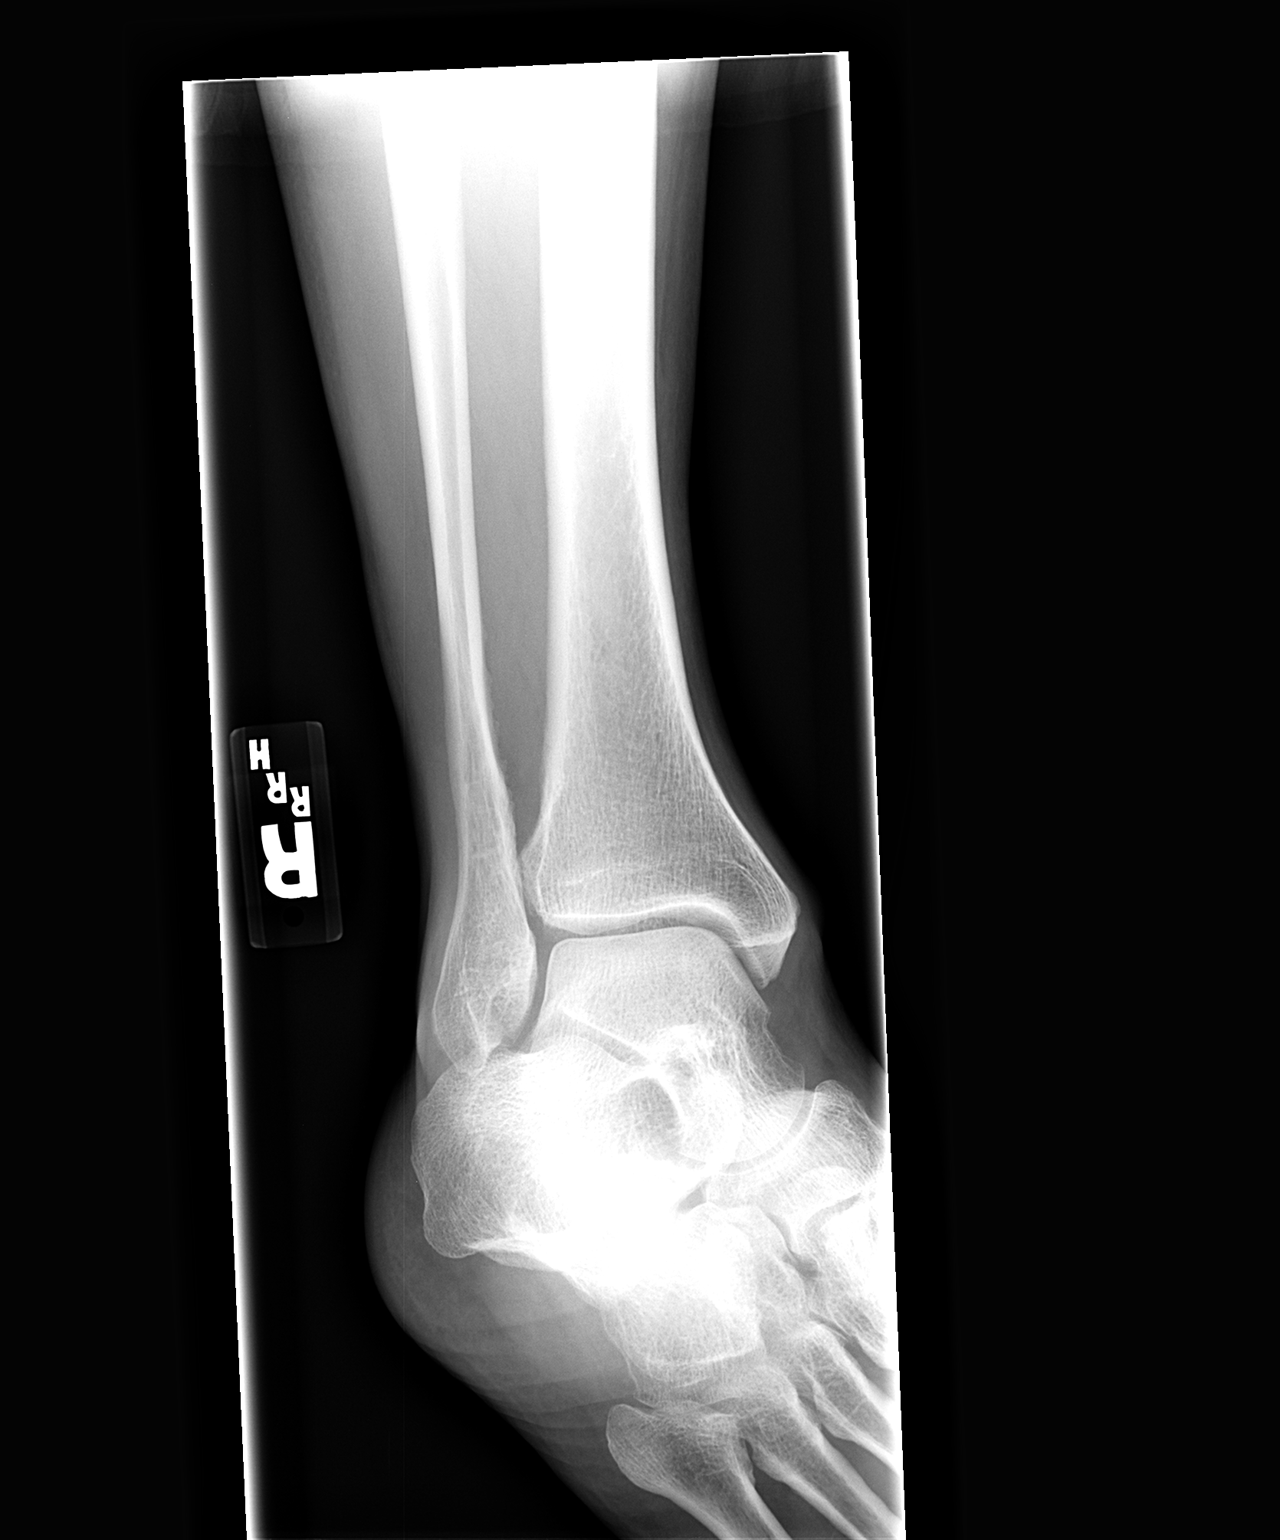

[view not recorded (3 of 3)]
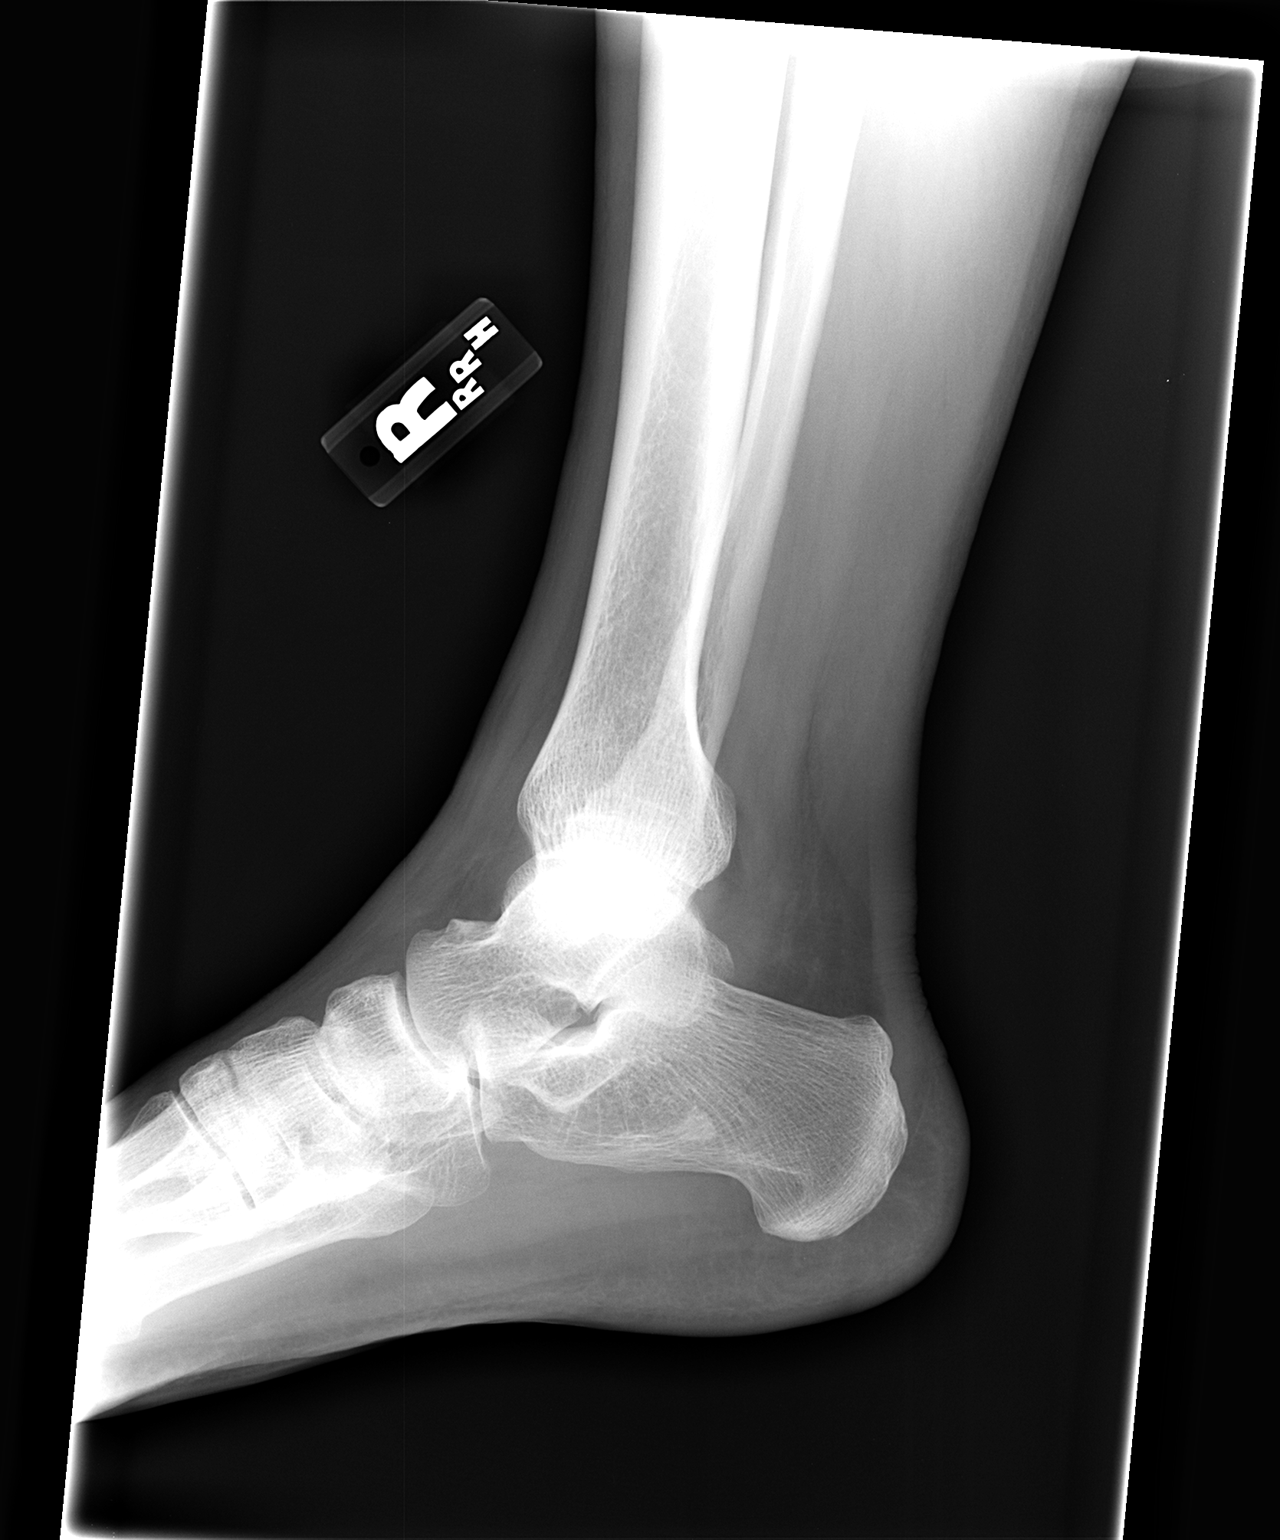

[3 of 3 positions shown; findings below may reference images not displayed]

FINDINGS: Multiple views of the right ankle demonstrate no acute displaced
fracture, subluxation, dislocation, or soft tissue abnormality.
IMPRESSION: No acute radiographic abnormality of the right ankle.

## 2016-12-09 DIAGNOSIS — H40003 Preglaucoma, unspecified, bilateral: Secondary | ICD-10-CM | POA: Diagnosis not present

## 2017-04-22 ENCOUNTER — Ambulatory Visit: Payer: BLUE CROSS/BLUE SHIELD | Admitting: Family Medicine

## 2017-04-22 ENCOUNTER — Ambulatory Visit (HOSPITAL_COMMUNITY)
Admission: RE | Admit: 2017-04-22 | Discharge: 2017-04-22 | Disposition: A | Discharge status: Home / Self Care | Payer: BLUE CROSS/BLUE SHIELD | Source: Ambulatory Visit | Attending: Family Medicine | Admitting: Family Medicine

## 2017-04-22 ENCOUNTER — Encounter: Payer: Self-pay | Admitting: Family Medicine

## 2017-04-22 VITALS — BP 112/74 | Temp 98.8°F | Ht 65.0 in | Wt 164.0 lb

## 2017-04-22 DIAGNOSIS — J449 Chronic obstructive pulmonary disease, unspecified: Secondary | ICD-10-CM | POA: Insufficient documentation

## 2017-04-22 DIAGNOSIS — J189 Pneumonia, unspecified organism: Secondary | ICD-10-CM | POA: Diagnosis not present

## 2017-04-22 DIAGNOSIS — R05 Cough: Secondary | ICD-10-CM | POA: Diagnosis not present

## 2017-04-22 MED ORDER — DOXYCYCLINE HYCLATE 100 MG PO CAPS: 100.0000 mg | ORAL_CAPSULE | Freq: Two times a day (BID) | ORAL | 0 refills | Status: DC

## 2017-04-22 MED ORDER — ALBUTEROL SULFATE HFA 108 (90 BASE) MCG/ACT IN AERS: 2.0000 | INHALATION_SPRAY | Freq: Four times a day (QID) | RESPIRATORY_TRACT | 2 refills | Status: DC | PRN

## 2017-04-22 MED ORDER — AZITHROMYCIN 250 MG PO TABS: ORAL_TABLET | ORAL | 0 refills | Status: DC

## 2017-04-22 MED ORDER — PREDNISONE 20 MG PO TABS: ORAL_TABLET | ORAL | 0 refills | Status: DC

## 2017-04-22 NOTE — Patient Instructions (Signed)
How to Use a Metered Dose Inhaler A metered dose inhaler is a handheld device for taking medicine that must be breathed into the lungs (inhaled). The device can be used to deliver a variety of inhaled medicines, including:  Quick relief or rescue medicines, such as bronchodilators.  Controller medicines, such as corticosteroids.  The medicine is delivered by pushing down on a metal canister to release a preset amount of spray and medicine. Each device contains the amount of medicine that is needed for a preset number of uses (inhalations). Your health care provider may recommend that you use a spacer with your inhaler to help you take the medicine more effectively. A spacer is a plastic tube with a mouthpiece on one end and an opening that connects to the inhaler on the other end. A spacer holds the medicine in a tube for a short time, which allows you to inhale more medicine. What are the risks? If you do not use your inhaler correctly, medicine might not reach your lungs to help you breathe. Inhaler medicine can cause side effects, such as:  Mouth or throat infection.  Cough.  Hoarseness.  Headache.  Nausea and vomiting.  Lung infection (pneumonia) in people who have a lung condition called COPD.  How to use a metered dose inhaler without a spacer 1. Remove the cap from the inhaler. 2. If you are using the inhaler for the first time, shake it for 5 seconds, turn it away from your face, then release 4 puffs into the air. This is called priming. 3. Shake the inhaler for 5 seconds. 4. Position the inhaler so the top of the canister faces up. 5. Put your index finger on the top of the medicine canister. Support the bottom of the inhaler with your thumb. 6. Breathe out normally and as completely as possible, away from the inhaler. 7. Either place the inhaler between your teeth and close your lips tightly around the mouthpiece, or hold the inhaler 1-2 inches (2.5-5 cm) away from your open  mouth. Keep your tongue down out of the way. If you are unsure which technique to use, ask your health care provider. 8. Press the canister down with your index finger to release the medicine, then inhale deeply and slowly through your mouth (not your nose) until your lungs are completely filled. Inhaling should take 4-6 seconds. 9. Hold the medicine in your lungs for 5-10 seconds (10 seconds is best). This helps the medicine get into the small airways of your lungs. 10. With your lips in a tight circle (pursed), breathe out slowly. 11. Repeat steps 3-10 until you have taken the number of puffs that your health care provider directed. Wait about 1 minute between puffs or as directed. 12. Put the cap on the inhaler. 13. If you are using a steroid inhaler, rinse your mouth with water, gargle, and spit out the water. Do not swallow the water. How to use a metered dose inhaler with a spacer 1. Remove the cap from the inhaler. 2. If you are using the inhaler for the first time, shake it for 5 seconds, turn it away from your face, then release 4 puffs into the air. This is called priming. 3. Shake the inhaler for 5 seconds. 4. Place the open end of the spacer onto the inhaler mouthpiece. 5. Position the inhaler so the top of the canister faces up and the spacer mouthpiece faces you. 6. Put your index finger on the top of the medicine canister.   Support the bottom of the inhaler and the spacer with your thumb. 7. Breathe out normally and as completely as possible, away from the spacer. 8. Place the spacer between your teeth and close your lips tightly around it. Keep your tongue down out of the way. 9. Press the canister down with your index finger to release the medicine, then inhale deeply and slowly through your mouth (not your nose) until your lungs are completely filled. Inhaling should take 4-6 seconds. 10. Hold the medicine in your lungs for 5-10 seconds (10 seconds is best). This helps the medicine  get into the small airways of your lungs. 11. With your lips in a tight circle (pursed), breathe out slowly. 12. Repeat steps 3-11 until you have taken the number of puffs that your health care provider directed. Wait about 1 minute between puffs or as directed. 13. Remove the spacer from the inhaler and put the cap on the inhaler. 14. If you are using a steroid inhaler, rinse your mouth with water, gargle, and spit out the water. Do not swallow the water. Follow these instructions at home:  Take your inhaled medicine only as told by your health care provider. Do not use the inhaler more than directed by your health care provider.  Keep all follow-up visits as told by your health care provider. This is important.  If your inhaler has a counter, you can check it to determine how full your inhaler is. If your inhaler does not have a counter, ask your health care provider when you will need to refill your inhaler and write the refill date on a calendar or on your inhaler canister. Note that you cannot know when an inhaler is empty by shaking it.  Follow directions on the package insert for care and cleaning of your inhaler and spacer. Contact a health care provider if:  Symptoms are only partially relieved with your inhaler.  You are having trouble using your inhaler.  You have an increase in phlegm.  You have headaches. Get help right away if:  You feel little or no relief after using your inhaler.  You have dizziness.  You have a fast heart rate.  You have chills or a fever.  You have night sweats.  There is blood in your phlegm. Summary  A metered dose inhaler is a handheld device for taking medicine that must be breathed into the lungs (inhaled).  The medicine is delivered by pushing down on a metal canister to release a preset amount of spray and medicine.  Each device contains the amount of medicine that is needed for a preset number of uses (inhalations). This  information is not intended to replace advice given to you by your health care provider. Make sure you discuss any questions you have with your health care provider. Document Released: 03/03/2005 Document Revised: 01/22/2016 Document Reviewed: 01/22/2016 Elsevier Interactive Patient Education  2017 Elsevier Inc.  

## 2017-04-22 NOTE — Progress Notes (Signed)
   Subjective:    Patient ID: Ronnie Walker, male    DOB: 1950/11/11, 67 y.o.   MRN: [id number]  Cough  This is a new problem. Episode onset: 3-4 weeks. Associated symptoms include rhinorrhea. Pertinent negatives include no chest pain, chills, ear pain, fever or wheezing. Treatments tried: cough drops, promethazine -dm cough syrup, augmentin.   Present for 3 weeks Now getting worse Tight cough Patient was treated at work he got a little bit better then got worse now with chest congestion coughing wheezing denies vomiting denies hemoptysis-is a former smoker  Review of Systems  Constitutional: Negative for activity change, chills and fever.  HENT: Positive for congestion and rhinorrhea. Negative for ear pain.   Eyes: Negative for discharge.  Respiratory: Positive for cough. Negative for wheezing.   Cardiovascular: Negative for chest pain.  Gastrointestinal: Negative for nausea and vomiting.  Musculoskeletal: Negative for arthralgias.       Objective:   Physical Exam  Constitutional: He appears well-developed.  HENT:  Head: Normocephalic and atraumatic.  Mouth/Throat: Oropharynx is clear and moist. No oropharyngeal exudate.  Eyes: Right eye exhibits no discharge. Left eye exhibits no discharge.  Neck: Normal range of motion.  Cardiovascular: Normal rate, regular rhythm and normal heart sounds.  No murmur heard. Pulmonary/Chest: Effort normal. No respiratory distress. He has wheezes. He has rales.  Lymphadenopathy:    He has no cervical adenopathy.  Neurological: He exhibits normal muscle tone.  Skin: Skin is warm and dry.  Nursing note and vitals reviewed.         Assessment & Plan:  Chest x-ray ordered Double antibiotics Prednisone taper Albuterol on a regular basis Warning signs discussed Follow-up 48 hours if not improving

## 2017-04-23 ENCOUNTER — Telehealth: Payer: Self-pay | Admitting: Family Medicine

## 2017-04-23 NOTE — Telephone Encounter (Signed)
Patient's job faxed over FMLA to be filled out. Please review and filled in highlighted area, date,sign. In yellow folder.

## 2017-04-27 DIAGNOSIS — Z029 Encounter for administrative examinations, unspecified: Secondary | ICD-10-CM

## 2017-05-26 ENCOUNTER — Telehealth: Payer: Self-pay | Admitting: Family Medicine

## 2017-05-26 NOTE — Telephone Encounter (Signed)
Please see below.

## 2017-05-26 NOTE — Telephone Encounter (Signed)
Patient had short-term disability form faxed over to be filled out. Please review and fill in highlighted areas. Date and sign in your yellow folder.

## 2017-05-26 NOTE — Telephone Encounter (Signed)
This was completed as requested 

## 2017-06-03 DIAGNOSIS — Z029 Encounter for administrative examinations, unspecified: Secondary | ICD-10-CM

## 2019-06-07 ENCOUNTER — Ambulatory Visit (INDEPENDENT_AMBULATORY_CARE_PROVIDER_SITE_OTHER): Payer: BC Managed Care – PPO | Admitting: Family Medicine

## 2019-06-07 ENCOUNTER — Ambulatory Visit: Payer: BC Managed Care – PPO | Attending: Internal Medicine

## 2019-06-07 ENCOUNTER — Other Ambulatory Visit: Payer: Self-pay | Admitting: *Deleted

## 2019-06-07 ENCOUNTER — Other Ambulatory Visit: Payer: Self-pay

## 2019-06-07 DIAGNOSIS — Z20822 Contact with and (suspected) exposure to covid-19: Secondary | ICD-10-CM

## 2019-06-07 DIAGNOSIS — Z125 Encounter for screening for malignant neoplasm of prostate: Secondary | ICD-10-CM

## 2019-06-07 DIAGNOSIS — J019 Acute sinusitis, unspecified: Secondary | ICD-10-CM

## 2019-06-07 DIAGNOSIS — Z0000 Encounter for general adult medical examination without abnormal findings: Secondary | ICD-10-CM

## 2019-06-07 DIAGNOSIS — E785 Hyperlipidemia, unspecified: Secondary | ICD-10-CM

## 2019-06-07 DIAGNOSIS — U071 COVID-19: Secondary | ICD-10-CM | POA: Insufficient documentation

## 2019-06-07 MED ORDER — AZITHROMYCIN 250 MG PO TABS: ORAL_TABLET | ORAL | 0 refills | Status: DC

## 2019-06-07 NOTE — Progress Notes (Signed)
   Subjective:    Patient ID: Ronnie Walker, male    DOB: Sep 22, 1950, 69 y.o.   MRN: [id number]  Sinusitis This is a new problem. Episode onset: 3 weeks. Associated symptoms include congestion and coughing. Pertinent negatives include no chills or ear pain. (Sinus drainage) Treatments tried: robitussin, cough drops.  Significant head congestion drainage coughing some sinus pressure denies wheezing difficulty breathing denies high fever chills sweats Going for covid test today.  Virtual Visit via Telephone Note  I connected with Ronnie Walker on 06/07/19 at  9:30 AM EDT by telephone and verified that I am speaking with the correct person using two identifiers.  Location: Patient: home Provider: office   I discussed the limitations, risks, security and privacy concerns of performing an evaluation and management service by telephone and the availability of in person appointments. I also discussed with the patient that there may be a patient responsible charge related to this service. The patient expressed understanding and agreed to proceed.   History of Present Illness:    Observations/Objective:   Assessment and Plan:   Follow Up Instructions:    I discussed the assessment and treatment plan with the patient. The patient was provided an opportunity to ask questions and all were answered. The patient agreed with the plan and demonstrated an understanding of the instructions.   The patient was advised to call back or seek an in-person evaluation if the symptoms worsen or if the condition fails to improve as anticipated.  I provided 18 minutes of non-face-to-face time during this encounter.      Review of Systems  Constitutional: Negative for activity change, chills and fever.  HENT: Positive for congestion and rhinorrhea. Negative for ear pain.   Eyes: Negative for discharge.  Respiratory: Positive for cough. Negative for wheezing.   Cardiovascular: Negative for chest pain  and leg swelling.  Gastrointestinal: Negative for nausea and vomiting.  Musculoskeletal: Negative for arthralgias.       Objective:   Physical Exam  Patient not respiratory distress talking freely  Unable to do exam via virtual    Assessment & Plan:  Probable sinusitis Also needs Covid testing Warning signs were discussed New breathing difficulties or worsening difficulties go to ER Stay home from work until results of Covid test is known Z-Pak to cover for sinusitis Allergy tablet recommended

## 2019-06-09 LAB — SARS-COV-2, NAA 2 DAY TAT

## 2019-06-09 LAB — NOVEL CORONAVIRUS, NAA: SARS-CoV-2, NAA: DETECTED — AB

## 2019-06-10 ENCOUNTER — Telehealth: Payer: Self-pay | Admitting: *Deleted

## 2019-06-10 NOTE — Telephone Encounter (Signed)
Ok thanks if patient ends up needing to have work excuse beyond the let us know if he needs work excuse go ahead and give it to him thank you

## 2019-06-10 NOTE — Telephone Encounter (Signed)
This patient would qualify for infusion protocol. The monoclonal antibody infusion can help reduce the risk of severe case of Covid It is a one-time infusion given through the infusion clinic in [place] Recommended for all individuals age 68 and higher The purpose of the medication is it can reduce the risk of Covid becoming worse Covid typically gets worse 7 to 10 days after it starts This medication has shown that it reduces the risk of hospitalization in severe cases Please talk with patient regarding this If patient consents go ahead and call [phone] to inform them of his desire to get this then their center will call him

## 2019-06-10 NOTE — Telephone Encounter (Signed)
Discussed with patient. Patient stated the cough started before March 15 and someone called him and was asking questions and he opted out. Patient states he is feeling good just has lingering cough. He was told he is out of his 10 days quarantine from when symptoms started but work wants him to call back Sunday to see if his cough is better. Patient picked up an O 2 monitor to have on hand but states he is feeling good and does not want to do infusion at this time.

## 2019-06-10 NOTE — Telephone Encounter (Signed)
FYI- Covid test positive. Patient notified yesterday via [place] and health department and given quarantine protocol and warning signs. sing

## 2019-08-23 ENCOUNTER — Observation Stay (HOSPITAL_COMMUNITY)
Admission: EM | Admit: 2019-08-23 | Discharge: 2019-08-24 | Disposition: A | Discharge status: Home / Self Care | Payer: BC Managed Care – PPO | Attending: Internal Medicine | Admitting: Internal Medicine

## 2019-08-23 ENCOUNTER — Emergency Department (HOSPITAL_COMMUNITY): Payer: BC Managed Care – PPO

## 2019-08-23 ENCOUNTER — Other Ambulatory Visit: Payer: Self-pay

## 2019-08-23 ENCOUNTER — Encounter (HOSPITAL_COMMUNITY): Payer: Self-pay | Admitting: Emergency Medicine

## 2019-08-23 DIAGNOSIS — I63412 Cerebral infarction due to embolism of left middle cerebral artery: Secondary | ICD-10-CM | POA: Diagnosis not present

## 2019-08-23 DIAGNOSIS — I639 Cerebral infarction, unspecified: Secondary | ICD-10-CM | POA: Diagnosis present

## 2019-08-23 DIAGNOSIS — Z7982 Long term (current) use of aspirin: Secondary | ICD-10-CM | POA: Insufficient documentation

## 2019-08-23 DIAGNOSIS — Z87891 Personal history of nicotine dependence: Secondary | ICD-10-CM | POA: Insufficient documentation

## 2019-08-23 DIAGNOSIS — Z01818 Encounter for other preprocedural examination: Secondary | ICD-10-CM | POA: Diagnosis not present

## 2019-08-23 DIAGNOSIS — R202 Paresthesia of skin: Secondary | ICD-10-CM | POA: Diagnosis not present

## 2019-08-23 DIAGNOSIS — I10 Essential (primary) hypertension: Secondary | ICD-10-CM | POA: Insufficient documentation

## 2019-08-23 DIAGNOSIS — R030 Elevated blood-pressure reading, without diagnosis of hypertension: Secondary | ICD-10-CM

## 2019-08-23 DIAGNOSIS — R270 Ataxia, unspecified: Secondary | ICD-10-CM | POA: Diagnosis not present

## 2019-08-23 DIAGNOSIS — Z20822 Contact with and (suspected) exposure to covid-19: Secondary | ICD-10-CM | POA: Insufficient documentation

## 2019-08-23 DIAGNOSIS — R9431 Abnormal electrocardiogram [ECG] [EKG]: Secondary | ICD-10-CM | POA: Diagnosis not present

## 2019-08-23 DIAGNOSIS — Z79899 Other long term (current) drug therapy: Secondary | ICD-10-CM | POA: Diagnosis not present

## 2019-08-23 DIAGNOSIS — K219 Gastro-esophageal reflux disease without esophagitis: Secondary | ICD-10-CM

## 2019-08-23 LAB — BASIC METABOLIC PANEL
Anion gap: 10 (ref 5–15)
BUN: 9 mg/dL (ref 8–23)
CO2: 25 mmol/L (ref 22–32)
Calcium: 9 mg/dL (ref 8.9–10.3)
Chloride: 102 mmol/L (ref 98–111)
Creatinine, Ser: 0.79 mg/dL (ref 0.61–1.24)
GFR calc Af Amer: >60 mL/min (ref >60)
GFR calc non Af Amer: >60 mL/min (ref >60)
Glucose, Bld: 111 mg/dL — ABNORMAL HIGH (ref 70–99)
Potassium: 4.2 mmol/L (ref 3.5–5.1)
Sodium: 137 mmol/L (ref 135–145)

## 2019-08-23 LAB — I-STAT CHEM 8, ED
BUN: 6 mg/dL — ABNORMAL LOW (ref 8–23)
Calcium, Ion: 1.15 mmol/L (ref 1.15–1.40)
Chloride: 102 mmol/L (ref 98–111)
Creatinine, Ser: 0.8 mg/dL (ref 0.61–1.24)
Glucose, Bld: 113 mg/dL — ABNORMAL HIGH (ref 70–99)
HCT: 46 % (ref 39.0–52.0)
Hemoglobin: 15.6 g/dL (ref 13.0–17.0)
Potassium: 3.8 mmol/L (ref 3.5–5.1)
Sodium: 140 mmol/L (ref 135–145)
TCO2: 28 mmol/L (ref 22–32)

## 2019-08-23 LAB — SARS CORONAVIRUS 2 BY RT PCR (HOSPITAL ORDER, PERFORMED IN ~~LOC~~ HOSPITAL LAB): SARS Coronavirus 2: NEGATIVE

## 2019-08-23 LAB — CBC
HCT: 44.1 % (ref 39.0–52.0)
Hemoglobin: 15.1 g/dL (ref 13.0–17.0)
MCH: 31.5 pg (ref 26.0–34.0)
MCHC: 34.2 g/dL (ref 30.0–36.0)
MCV: 92.1 fL (ref 80.0–100.0)
Platelets: 214 10*3/uL (ref 150–400)
RBC: 4.79 MIL/uL (ref 4.22–5.81)
RDW: 13.6 % (ref 11.5–15.5)
WBC: 6.5 10*3/uL (ref 4.0–10.5)
nRBC: 0 % (ref 0.0–0.2)

## 2019-08-23 LAB — APTT: aPTT: 27 seconds (ref 24–36)

## 2019-08-23 LAB — DIFFERENTIAL
Abs Immature Granulocytes: 0.01 10*3/uL (ref 0.00–0.07)
Basophils Absolute: 0.1 10*3/uL (ref 0.0–0.1)
Basophils Relative: 1 %
Eosinophils Absolute: 0.1 10*3/uL (ref 0.0–0.5)
Eosinophils Relative: 2 %
Immature Granulocytes: 0 %
Lymphocytes Relative: 29 %
Lymphs Abs: 1.9 10*3/uL (ref 0.7–4.0)
Monocytes Absolute: 0.7 10*3/uL (ref 0.1–1.0)
Monocytes Relative: 10 %
Neutro Abs: 3.8 10*3/uL (ref 1.7–7.7)
Neutrophils Relative %: 58 %

## 2019-08-23 LAB — URINALYSIS, ROUTINE W REFLEX MICROSCOPIC
Bilirubin Urine: NEGATIVE
Glucose, UA: NEGATIVE mg/dL
Hgb urine dipstick: NEGATIVE
Ketones, ur: NEGATIVE mg/dL
Leukocytes,Ua: NEGATIVE
Nitrite: NEGATIVE
Protein, ur: NEGATIVE mg/dL
Specific Gravity, Urine: 1.008 (ref 1.005–1.030)
pH: 6 (ref 5.0–8.0)

## 2019-08-23 LAB — ETHANOL: Alcohol, Ethyl (B): <10 mg/dL (ref <10)

## 2019-08-23 LAB — RAPID URINE DRUG SCREEN, HOSP PERFORMED
Amphetamines: NOT DETECTED
Barbiturates: NOT DETECTED
Benzodiazepines: NOT DETECTED
Cocaine: NOT DETECTED
Opiates: NOT DETECTED
Tetrahydrocannabinol: NOT DETECTED

## 2019-08-23 LAB — PROTIME-INR
INR: 1 (ref 0.8–1.2)
Prothrombin Time: 12.5 seconds (ref 11.4–15.2)

## 2019-08-23 MED ORDER — SODIUM CHLORIDE 0.9 % IV SOLN: INTRAVENOUS | Status: DC

## 2019-08-23 MED ORDER — ASPIRIN 325 MG PO TABS
325.0000 mg | ORAL_TABLET | Freq: Every day | ORAL | Status: DC
  Administered 2019-08-23 – 2019-08-24 (×2): 325 mg via ORAL
  Filled 2019-08-23 (×2): qty 1

## 2019-08-23 MED ORDER — LABETALOL HCL 5 MG/ML IV SOLN: 10.0000 mg | INTRAVENOUS | Status: DC | PRN

## 2019-08-23 MED ORDER — STROKE: EARLY STAGES OF RECOVERY BOOK: Freq: Once | Status: AC

## 2019-08-23 MED ORDER — ACETAMINOPHEN 325 MG PO TABS: 650.0000 mg | ORAL_TABLET | ORAL | Status: DC | PRN

## 2019-08-23 MED ORDER — ASPIRIN 300 MG RE SUPP: 300.0000 mg | Freq: Every day | RECTAL | Status: DC

## 2019-08-23 MED ORDER — SENNOSIDES-DOCUSATE SODIUM 8.6-50 MG PO TABS: 1.0000 | ORAL_TABLET | Freq: Every evening | ORAL | Status: DC | PRN

## 2019-08-23 MED ORDER — ACETAMINOPHEN 160 MG/5ML PO SOLN: 650.0000 mg | ORAL | Status: DC | PRN

## 2019-08-23 MED ORDER — ENOXAPARIN SODIUM 40 MG/0.4ML ~~LOC~~ SOLN
40.0000 mg | SUBCUTANEOUS | Status: DC
  Administered 2019-08-23: 40 mg via SUBCUTANEOUS
  Filled 2019-08-23: qty 0.4

## 2019-08-23 MED ORDER — ACETAMINOPHEN 650 MG RE SUPP: 650.0000 mg | RECTAL | Status: DC | PRN

## 2019-08-23 NOTE — H&P (Signed)
History and Physical    Ronnie Walker CXK:[id number] DOB: Aug 01, 1950 DOA: 08/23/2019  PCP: Kathyrn Drown, MD   Patient coming from: Home   Chief Complaint: Right face and hand tingling, funny sensation in right leg   HPI: Ronnie Walker is a 69 y.o. male who reports history of hyperlipidemia and COVID-19 infection in March 2021, now presenting to the emergency department for evaluation of right-sided numbness and tingling that began last night.  Patient reports that he been in his usual state of health until approximately 9 PM last night when he was getting ready to eat dinner and noticed some numbness involving the right side of his face, mainly around the mouth and described as similar to when he has received a nerve block at the dentist office.  He went on to eat his dinner without any difficulty swallowing, choking, or coughing, and began to notice numbness and tingling involving the right hand and then a "funny sensation" involving the right leg.  Symptoms persist but now feel as though they are improving.  There is no associated headache, change in vision or hearing, or weakness.  He has never experienced this previously.  Denies any history of chest pain or palpitations.  Reports that his Covid symptoms were mild, and included nonproductive cough and fatigue, and have completely resolved.  He does not currently take any medications but is not necessarily opposed to it if any were to be recommended.  ED Course: Upon arrival to the ED, patient is found to be afebrile, saturating well on room air, and hypertensive to 180/87.  EKG features sinus rhythm and noncontrast head CT is negative.  Chemistry panel and CBC are unremarkable.  MRI brain is concerning for a small focus of acute ischemia within the left thalamus without hemorrhage or mass-effect.  MRA head is negative.  COVID-19 PCR is pending though the patient has completely recovered from mild infection in March and will not require  isolation.  Review of Systems:  All other systems reviewed and apart from HPI, are negative.  Past Medical History:  Diagnosis Date  . Hyperlipidemia     History reviewed. No pertinent surgical history.   reports that he has quit smoking. He quit smokeless tobacco use about 16 years ago. No history on file for alcohol and drug.  No Known Allergies  Family History  Problem Relation Age of Onset  . Cancer Mother        breast  . Diabetes Mother   . Hypertension Father   . Heart disease Father      Prior to Admission medications   Not on File    Physical Exam: Vitals:   08/23/19 1532 08/23/19 1627 08/23/19 1856 08/23/19 1938  BP: (!) 156/104 (!) 148/89 (!) 180/87 (!) 169/91  Pulse: 67 66 76 63  Resp: 16 16 17 13   Temp: 98.6 F (37 C)     TempSrc: Oral     SpO2: 100% 99% 100% 100%  Weight: 65.8 kg     Height: 5\' 6"  (1.676 m)       Constitutional: NAD, calm  Eyes: PERTLA, lids and conjunctivae normal ENMT: Mucous membranes are moist. Posterior pharynx clear of any exudate or lesions.   Neck: normal, supple, no masses, no thyromegaly Respiratory: no wheezing, no crackles. No accessory muscle use.  Cardiovascular: S1 & S2 heard, regular rate and rhythm. No extremity edema.   Abdomen: No distension, no tenderness, soft. Bowel sounds active.  Musculoskeletal: no clubbing /  cyanosis. No joint deformity upper and lower extremities.   Skin: no significant rashes, lesions, ulcers. Warm, dry, well-perfused. Neurologic: CN 2-12 grossly intact. Sensation to light touch diminished in dorsal right hand. Patellar DTRs normal. Strength 5/5 in all 4 limbs.  Psychiatric: Alert and oriented to person, place, and situation. Very pleasant and cooperative.    Labs and Imaging on Admission: I have personally reviewed following labs and imaging studies  CBC: Recent Labs  Lab 08/23/19 1550 08/23/19 1934  WBC 6.5  --   NEUTROABS 3.8  --   HGB 15.1 15.6  HCT 44.1 46.0  MCV 92.1   --   PLT 214  --    Basic Metabolic Panel: Recent Labs  Lab 08/23/19 1550 08/23/19 1934  NA 137 140  K 4.2 3.8  CL 102 102  CO2 25  --   GLUCOSE 111* 113*  BUN 9 6*  CREATININE 0.79 0.80  CALCIUM 9.0  --    GFR: Estimated Creatinine Clearance: 80.9 mL/min (by C-G formula based on SCr of 0.8 mg/dL). Liver Function Tests: No results for input(s): AST, ALT, ALKPHOS, BILITOT, PROT, ALBUMIN in the last 168 hours. No results for input(s): LIPASE, AMYLASE in the last 168 hours. No results for input(s): AMMONIA in the last 168 hours. Coagulation Profile: Recent Labs  Lab 08/23/19 1550  INR 1.0   Cardiac Enzymes: No results for input(s): CKTOTAL, CKMB, CKMBINDEX, TROPONINI in the last 168 hours. BNP (last 3 results) No results for input(s): PROBNP in the last 8760 hours. HbA1C: No results for input(s): HGBA1C in the last 72 hours. CBG: No results for input(s): GLUCAP in the last 168 hours. Lipid Profile: No results for input(s): CHOL, HDL, LDLCALC, TRIG, CHOLHDL, LDLDIRECT in the last 72 hours. Thyroid Function Tests: No results for input(s): TSH, T4TOTAL, FREET4, T3FREE, THYROIDAB in the last 72 hours. Anemia Panel: No results for input(s): VITAMINB12, FOLATE, FERRITIN, TIBC, IRON, RETICCTPCT in the last 72 hours. Urine analysis:    Component Value Date/Time   COLORURINE STRAW (A) 08/23/2019 1906   APPEARANCEUR CLEAR 08/23/2019 1906   LABSPEC 1.008 08/23/2019 1906   PHURINE 6.0 08/23/2019 1906   GLUCOSEU NEGATIVE 08/23/2019 1906   HGBUR NEGATIVE 08/23/2019 1906   BILIRUBINUR NEGATIVE 08/23/2019 1906   KETONESUR NEGATIVE 08/23/2019 1906   PROTEINUR NEGATIVE 08/23/2019 1906   NITRITE NEGATIVE 08/23/2019 1906   LEUKOCYTESUR NEGATIVE 08/23/2019 1906   Sepsis Labs: @LABRCNTIP (procalcitonin:4,lacticidven:4) ) Recent Results (from the past 240 hour(s))  SARS Coronavirus 2 by RT PCR (hospital order, performed in [facility] hospital lab) Nasopharyngeal Nasopharyngeal  Swab     Status: None   Collection Time: 08/23/19  7:24 PM   Specimen: Nasopharyngeal Swab  Result Value Ref Range Status   SARS Coronavirus 2 NEGATIVE NEGATIVE Final    Comment: (NOTE) SARS-CoV-2 target nucleic acids are NOT DETECTED. The SARS-CoV-2 RNA is generally detectable in upper and lower respiratory specimens during the acute phase of infection. The lowest concentration of SARS-CoV-2 viral copies this assay can detect is 250 copies / mL. A negative result does not preclude SARS-CoV-2 infection and should not be used as the sole basis for treatment or other patient management decisions.  A negative result may occur with improper specimen collection / handling, submission of specimen other than nasopharyngeal swab, presence of viral mutation(s) within the areas targeted by this assay, and inadequate number of viral copies (<250 copies / mL). A negative result must be combined with clinical observations, patient history, and epidemiological information.  Fact Sheet for Patients:   StrictlyIdeas.no Fact Sheet for Healthcare Providers: BankingDealers.co.za This test is not yet approved or cleared  by the [place] FDA and has been authorized for detection and/or diagnosis of SARS-CoV-2 by FDA under an Emergency Use Authorization (EUA).  This EUA will remain in effect (meaning this test can be used) for the duration of the COVID-19 declaration under Section 564(b)(1) of the Act, 21 U.S.C. section 360bbb-3(b)(1), unless the authorization is terminated or revoked sooner. Performed at [facility], [address], [place], [place] [address]      Radiological Exams on Admission: DG Eye Foreign Body  Result Date: 08/23/2019 CLINICAL DATA:  Metal working/exposure; clearance prior to MRI EXAM: ORBITS FOR FOREIGN BODY - 2 VIEW COMPARISON:  None FINDINGS: No or metallic foreign bodies. Visualized paranasal sinuses clear. Osseous structures  unremarkable. IMPRESSION: No orbital metallic foreign bodies. Electronically Signed   By: Lavonia Dana M.D.   On: 08/23/2019 18:12   CT Head Wo Contrast  Result Date: 08/23/2019 CLINICAL DATA:  Onset right facial tingling and ataxia last night at 9 p.m. EXAM: CT HEAD WITHOUT CONTRAST TECHNIQUE: Contiguous axial images were obtained from the base of the skull through the vertex without intravenous contrast. COMPARISON:  None. FINDINGS: Brain: No evidence of acute infarction, hemorrhage, hydrocephalus, extra-axial collection or mass lesion/mass effect. Vascular: No hyperdense vessel or unexpected calcification. Skull: Intact.  No focal lesion. Sinuses/Orbits: Negative. Other: None. IMPRESSION: Negative head CT. Electronically Signed   By: Inge Rise M.D.   On: 08/23/2019 16:13   MR ANGIO HEAD WO CONTRAST  Result Date: 08/23/2019 CLINICAL DATA:  stroke follow-up EXAM: MRI HEAD WITHOUT CONTRAST MRA HEAD WITHOUT CONTRAST TECHNIQUE: Multiplanar, multiecho pulse sequences of the brain and surrounding structures were obtained without intravenous contrast. Angiographic images of the head were obtained using MRA technique without contrast. COMPARISON:  None. FINDINGS: MRI HEAD FINDINGS BRAIN: Small focus of abnormal diffusion restriction within the left thalamus. No hemorrhage or mass effect. Multifocal white matter hyperintensity, most commonly due to chronic ischemic microangiopathy. The cerebral and cerebellar volume are age-appropriate. There is no hydrocephalus. Blood-sensitive sequences show no chronic microhemorrhage or superficial siderosis. The midline structures are normal. SKULL AND UPPER CERVICAL SPINE: The visualized skull base, calvarium, upper cervical spine and extracranial soft tissues are normal. SINUSES/ORBITS: No fluid levels or advanced mucosal thickening. No mastoid or middle ear effusion. The orbits are normal. MRA HEAD FINDINGS POSTERIOR CIRCULATION: --Basilar artery: Normal. --Posterior  cerebral arteries: Bilateral fetal origins. No acute abnormality. --Superior cerebellar arteries: Normal. --Inferior cerebellar arteries: Normal anterior and posterior inferior cerebellar arteries. ANTERIOR CIRCULATION: --Intracranial internal carotid arteries: Normal. --Anterior cerebral arteries: Normal. Both A1 segments are present. Patent anterior communicating artery. --Middle cerebral arteries: Normal. --Posterior communicating arteries: Present bilaterally. IMPRESSION: 1. Small focus of acute ischemia within the left thalamus. No hemorrhage or mass effect. 2. Normal intracranial MRA. Electronically Signed   By: Ulyses Jarred M.D.   On: 08/23/2019 19:15   MR BRAIN WO CONTRAST  Result Date: 08/23/2019 CLINICAL DATA:  stroke follow-up EXAM: MRI HEAD WITHOUT CONTRAST MRA HEAD WITHOUT CONTRAST TECHNIQUE: Multiplanar, multiecho pulse sequences of the brain and surrounding structures were obtained without intravenous contrast. Angiographic images of the head were obtained using MRA technique without contrast. COMPARISON:  None. FINDINGS: MRI HEAD FINDINGS BRAIN: Small focus of abnormal diffusion restriction within the left thalamus. No hemorrhage or mass effect. Multifocal white matter hyperintensity, most commonly due to chronic ischemic microangiopathy. The cerebral and cerebellar volume  are age-appropriate. There is no hydrocephalus. Blood-sensitive sequences show no chronic microhemorrhage or superficial siderosis. The midline structures are normal. SKULL AND UPPER CERVICAL SPINE: The visualized skull base, calvarium, upper cervical spine and extracranial soft tissues are normal. SINUSES/ORBITS: No fluid levels or advanced mucosal thickening. No mastoid or middle ear effusion. The orbits are normal. MRA HEAD FINDINGS POSTERIOR CIRCULATION: --Basilar artery: Normal. --Posterior cerebral arteries: Bilateral fetal origins. No acute abnormality. --Superior cerebellar arteries: Normal. --Inferior cerebellar  arteries: Normal anterior and posterior inferior cerebellar arteries. ANTERIOR CIRCULATION: --Intracranial internal carotid arteries: Normal. --Anterior cerebral arteries: Normal. Both A1 segments are present. Patent anterior communicating artery. --Middle cerebral arteries: Normal. --Posterior communicating arteries: Present bilaterally. IMPRESSION: 1. Small focus of acute ischemia within the left thalamus. No hemorrhage or mass effect. 2. Normal intracranial MRA. Electronically Signed   By: Ulyses Jarred M.D.   On: 08/23/2019 19:15    EKG: Independently reviewed. Sinus rhythm.   Assessment/Plan  1. Acute ischemic CVA  - Presents with right-sided numbness and tingling that began ~9 pm on 08/22/19 and is found on MRI to have acute ischemic infarction involving the left thalamus; no acute findings on MRA head  - Continue cardiac monitoring, frequent neuro checks, NPO pending swallow screen  - Consult with PT/OT/SLP  - Check carotid US, echocardiogram, fasting lipids, and A1c    DVT prophylaxis: Lovenox  Code Status: Full  Family Communication: Wife updated at bedside Disposition Plan:  Patient is from: Home  Anticipated d/c is to: Home  Anticipated d/c date is: 6/9 or 08/25/19 Patient currently: Pending further workup of acute CVA, neurology consultation  Consults called: Message sent to neurology for routine/AM consult request, have not discussed with them yet  Admission status: Observation    Vianne Bulls, MD [facility] Pager: See [email or web address]  If 7AM-7PM, please contact the daytime attending [email or web address]  08/23/2019, 8:52 PM

## 2019-08-23 NOTE — ED Triage Notes (Signed)
C/o right facial tingling since last 2100.  Right hand tingling and right leg, "feels funny."

## 2019-08-23 NOTE — ED Provider Notes (Signed)
[facility] EMERGENCY DEPARTMENT Provider Note   CSN: [id number] Arrival date & time: 08/23/19  1458     History Chief Complaint  Patient presents with  . Other    facial tingling (right)    Ronnie Walker is a 69 y.o. male.  Patient with acute onset of right facial tingling numbness right hand dorsum with tingling.  And felt as if something was not right about his right leg.  Just and seemed to be normal.  Onset of this was at 9 PM last evening.  No visual changes no speech problems.  Patient states that things have kind of waxed and waned throughout the day may be a little bit better.  But certainly not resolved.  No history of any similar symptoms.  Patient's complaint in triage was noted CT was ordered and then MRI was ordered.  From triage waiting room area.  Based on patient's onset of symptoms and nature of symptoms.  Activation of code stroke was not required.  Patient has not had any of the Covid vaccinations.  Patient's past medical history is significant for hyperlipidemia.  On presentation his blood pressures were 505 systolic.        Past Medical History:  Diagnosis Date  . Hyperlipidemia     Patient Active Problem List   Diagnosis Date Noted  . Hyperlipidemia 02/16/2013  . Hyperglycemia 02/16/2013    History reviewed. No pertinent surgical history.     Family History  Problem Relation Age of Onset  . Cancer Mother        breast  . Diabetes Mother   . Hypertension Father   . Heart disease Father     Social History   Tobacco Use  . Smoking status: Former Research scientist (life sciences)  . Smokeless tobacco: Former Systems developer    Quit date: 02/05/2003  Substance Use Topics  . Alcohol use: Not on file  . Drug use: Not on file    Home Medications Prior to Admission medications   Medication Sig Start Date End Date Taking? Authorizing Provider  azithromycin (ZITHROMAX Z-PAK) 250 MG tablet Take 2 tablets (500 mg) on  Day 1,  followed by 1 tablet (250 mg) once daily on Days 2 through  5. 06/07/19   Wolfgang Phoenix, Elayne Snare, MD    Allergies    Patient has no known allergies.  Review of Systems   Review of Systems  Constitutional: Negative for chills and fever.  HENT: Negative for congestion, rhinorrhea and sore throat.   Eyes: Negative for visual disturbance.  Respiratory: Negative for cough and shortness of breath.   Cardiovascular: Negative for chest pain and leg swelling.  Gastrointestinal: Negative for abdominal pain, diarrhea, nausea and vomiting.  Genitourinary: Negative for dysuria.  Musculoskeletal: Negative for back pain and neck pain.  Skin: Negative for rash.  Neurological: Positive for numbness. Negative for dizziness, facial asymmetry, speech difficulty, weakness, light-headedness and headaches.  Hematological: Does not bruise/bleed easily.  Psychiatric/Behavioral: Negative for confusion.    Physical Exam Updated Vital Signs BP (!) 180/87   Pulse 76   Temp 98.6 F (37 C) (Oral)   Resp 17   Ht 1.676 m (5\' 6" )   Wt 65.8 kg   SpO2 100%   BMI 23.40 kg/m   Physical Exam Vitals and nursing note reviewed.  Constitutional:      Appearance: Normal appearance. He is well-developed.  HENT:     Head: Normocephalic and atraumatic.  Eyes:     Extraocular Movements: Extraocular movements intact.  Conjunctiva/sclera: Conjunctivae normal.     Pupils: Pupils are equal, round, and reactive to light.  Cardiovascular:     Rate and Rhythm: Normal rate and regular rhythm.     Heart sounds: No murmur.  Pulmonary:     Effort: Pulmonary effort is normal. No respiratory distress.     Breath sounds: Normal breath sounds.  Abdominal:     Palpations: Abdomen is soft.     Tenderness: There is no abdominal tenderness.  Musculoskeletal:        General: No swelling.     Cervical back: Normal range of motion and neck supple.  Skin:    General: Skin is warm and dry.     Capillary Refill: Capillary refill takes less than 2 seconds.  Neurological:     Mental Status: He  is alert and oriented to person, place, and time.     Cranial Nerves: Cranial nerve deficit present.     Comments: Patient with seems to be some sensory numbness to the right side of the face.  And questionable to the back of the right hand.  No significant motor weakness noted either in the cranial nerves or in the upper or lower extremities.     ED Results / Procedures / Treatments   Labs (all labs ordered are listed, but only abnormal results are displayed) Labs Reviewed  BASIC METABOLIC PANEL - Abnormal; Notable for the following components:      Result Value   Glucose, Bld 111 (*)    All other components within normal limits  CBC  ETHANOL  PROTIME-INR  APTT  DIFFERENTIAL  RAPID URINE DRUG SCREEN, HOSP PERFORMED  URINALYSIS, ROUTINE W REFLEX MICROSCOPIC  I-STAT CHEM 8, ED    EKG None  Radiology DG Eye Foreign Body  Result Date: 08/23/2019 CLINICAL DATA:  Metal working/exposure; clearance prior to MRI EXAM: ORBITS FOR FOREIGN BODY - 2 VIEW COMPARISON:  None FINDINGS: No or metallic foreign bodies. Visualized paranasal sinuses clear. Osseous structures unremarkable. IMPRESSION: No orbital metallic foreign bodies. Electronically Signed   By: Lavonia Dana M.D.   On: 08/23/2019 18:12   CT Head Wo Contrast  Result Date: 08/23/2019 CLINICAL DATA:  Onset right facial tingling and ataxia last night at 9 p.m. EXAM: CT HEAD WITHOUT CONTRAST TECHNIQUE: Contiguous axial images were obtained from the base of the skull through the vertex without intravenous contrast. COMPARISON:  None. FINDINGS: Brain: No evidence of acute infarction, hemorrhage, hydrocephalus, extra-axial collection or mass lesion/mass effect. Vascular: No hyperdense vessel or unexpected calcification. Skull: Intact.  No focal lesion. Sinuses/Orbits: Negative. Other: None. IMPRESSION: Negative head CT. Electronically Signed   By: Inge Rise M.D.   On: 08/23/2019 16:13    Procedures Procedures (including critical care  time)  CRITICAL CARE Performed by: Fredia Sorrow Total critical care time: 35 minutes Critical care time was exclusive of separately billable procedures and treating other patients. Critical care was necessary to treat or prevent imminent or life-threatening deterioration. Critical care was time spent personally by me on the following activities: development of treatment plan with patient and/or surrogate as well as nursing, discussions with consultants, evaluation of patient's response to treatment, examination of patient, obtaining history from patient or surrogate, ordering and performing treatments and interventions, ordering and review of laboratory studies, ordering and review of radiographic studies, pulse oximetry and re-evaluation of patient's condition.   Medications Ordered in ED Medications - No data to display  ED Course  I have reviewed the triage vital signs  and the nursing notes.  Pertinent labs & imaging results that were available during my care of the patient were reviewed by me and considered in my medical decision making (see chart for details).    MDM Rules/Calculators/A&P                     MRI showed evidence of a left thalamic infarct.  MRA without any acute findings.  This is consistent with an acute infarct.  Would be consistent with patient's symptoms as well.  Patient out of the window for TPA.  Also with MRA being negative no indication for vascular intervention.  Patient should be stable for admission here.  Hospitalist contacted and they will admit here.  Final Clinical Impression(s) / ED Diagnoses Final diagnoses:  Cerebrovascular accident (CVA), unspecified mechanism [facility])    Rx / [place] Orders ED Discharge Orders    None       Fredia Sorrow, MD 08/23/19 2101

## 2019-08-23 NOTE — ED Notes (Signed)
Pt eating meal at this time

## 2019-08-24 ENCOUNTER — Observation Stay (HOSPITAL_COMMUNITY): Payer: BC Managed Care – PPO

## 2019-08-24 ENCOUNTER — Observation Stay (HOSPITAL_BASED_OUTPATIENT_CLINIC_OR_DEPARTMENT_OTHER): Payer: BC Managed Care – PPO

## 2019-08-24 DIAGNOSIS — I6521 Occlusion and stenosis of right carotid artery: Secondary | ICD-10-CM | POA: Diagnosis not present

## 2019-08-24 DIAGNOSIS — I639 Cerebral infarction, unspecified: Secondary | ICD-10-CM | POA: Diagnosis not present

## 2019-08-24 DIAGNOSIS — I6389 Other cerebral infarction: Secondary | ICD-10-CM

## 2019-08-24 DIAGNOSIS — K219 Gastro-esophageal reflux disease without esophagitis: Secondary | ICD-10-CM

## 2019-08-24 DIAGNOSIS — R030 Elevated blood-pressure reading, without diagnosis of hypertension: Secondary | ICD-10-CM

## 2019-08-24 DIAGNOSIS — E785 Hyperlipidemia, unspecified: Secondary | ICD-10-CM

## 2019-08-24 LAB — HEMOGLOBIN A1C
Hgb A1c MFr Bld: 6.2 % — ABNORMAL HIGH (ref 4.8–5.6)
Mean Plasma Glucose: 131.24 mg/dL

## 2019-08-24 LAB — LIPID PANEL
Cholesterol: 269 mg/dL — ABNORMAL HIGH (ref 0–200)
HDL: 43 mg/dL (ref >40)
LDL Cholesterol: 179 mg/dL — ABNORMAL HIGH (ref 0–99)
Total CHOL/HDL Ratio: 6.3 RATIO
Triglycerides: 234 mg/dL — ABNORMAL HIGH (ref <150)
VLDL: 47 mg/dL — ABNORMAL HIGH (ref 0–40)

## 2019-08-24 LAB — CBC
HCT: 42.2 % (ref 39.0–52.0)
Hemoglobin: 14.3 g/dL (ref 13.0–17.0)
MCH: 31.1 pg (ref 26.0–34.0)
MCHC: 33.9 g/dL (ref 30.0–36.0)
MCV: 91.7 fL (ref 80.0–100.0)
Platelets: 195 10*3/uL (ref 150–400)
RBC: 4.6 MIL/uL (ref 4.22–5.81)
RDW: 13.5 % (ref 11.5–15.5)
WBC: 5.9 10*3/uL (ref 4.0–10.5)
nRBC: 0 % (ref 0.0–0.2)

## 2019-08-24 LAB — ECHOCARDIOGRAM COMPLETE
Height: 66 in
Weight: 2320 oz

## 2019-08-24 LAB — HIV ANTIBODY (ROUTINE TESTING W REFLEX): HIV Screen 4th Generation wRfx: NONREACTIVE

## 2019-08-24 MED ORDER — PANTOPRAZOLE SODIUM 20 MG PO TBEC
20.0000 mg | DELAYED_RELEASE_TABLET | Freq: Every day | ORAL | 1 refills | Status: DC
Start: 2019-08-24 — End: 2020-02-14

## 2019-08-24 MED ORDER — ATORVASTATIN CALCIUM 40 MG PO TABS
40.0000 mg | ORAL_TABLET | Freq: Every day | ORAL | Status: DC
  Administered 2019-08-24: 40 mg via ORAL
  Filled 2019-08-24: qty 1

## 2019-08-24 MED ORDER — ASPIRIN EC 325 MG PO TBEC
325.0000 mg | DELAYED_RELEASE_TABLET | Freq: Every day | ORAL | 3 refills | Status: DC
Start: 2019-08-24 — End: 2020-02-14

## 2019-08-24 MED ORDER — ATORVASTATIN CALCIUM 40 MG PO TABS: 40.0000 mg | ORAL_TABLET | Freq: Every day | ORAL | 3 refills | Status: DC

## 2019-08-24 NOTE — Progress Notes (Signed)
2 D echo completed 

## 2019-08-24 NOTE — Evaluation (Signed)
Physical Therapy Evaluation Patient Details Name: Ronnie Walker MRN: [id number] DOB: 04-Feb-1951 Today's Date: 08/24/2019   History of Present Illness  Ronnie Walker is a 69 y.o. male who reports history of hyperlipidemia and COVID-19 infection in March 2021, now presenting to the emergency department for evaluation of right-sided numbness and tingling that began last night.  Patient reports that he been in his usual state of health until approximately 9 PM last night when he was getting ready to eat dinner and noticed some numbness involving the right side of his face, mainly around the mouth and described as similar to when he has received a nerve block at the dentist office.  He went on to eat his dinner without any difficulty swallowing, choking, or coughing, and began to notice numbness and tingling involving the right hand and then a "funny sensation" involving the right leg.  Symptoms persist but now feel as though they are improving.  There is no associated headache, change in vision or hearing, or weakness.  He has never experienced this previously.  Denies any history of chest pain or palpitations.  Reports that his Covid symptoms were mild, and included nonproductive cough and fatigue, and have completely resolved.  He does not currently take any medications but is not necessarily opposed to it if any were to be recommended. MRI positive for left acute infarct  Clinical Impression  Physical therapy evaluation completed, patient is at baseline and no further PT services recommended at this time. Pt reports some numbness in face, improvement in BUE and denies any in BLE. Patient discharged to care of nursing for ambulation daily as tolerated for length of stay.     Follow Up Recommendations No PT follow up    Equipment Recommendations  None recommended by PT    Recommendations for Other Services       Precautions / Restrictions Precautions Precautions: None Restrictions Weight Bearing  Restrictions: No      Mobility  Bed Mobility Overal bed mobility: Independent  Transfers Overall transfer level: Independent Equipment used: None  General transfer comment: rises from EOB with good steadiness, no UE assisting  Ambulation/Gait Ambulation/Gait assistance: Independent Gait Distance (Feet): 300 Feet Assistive device: IV Pole;None Gait Pattern/deviations: WFL(Within Functional Limits);Step-through pattern Gait velocity: WFL  General Gait Details: pt ambulates throughout room and on floor, navigates ascending/descending sloped surfaces, direction changes without loss of balance or unsteadiness noted, good bil dorsiflexion, good endurance  Stairs            Wheelchair Mobility    Modified Rankin (Stroke Patients Only)       Balance Overall balance assessment: Independent              Pertinent Vitals/Pain Pain Assessment: No/denies pain    Home Living Family/patient expects to be discharged to:: Private residence Living Arrangements: Spouse/significant other Available Help at Discharge: Family;Available PRN/intermittently Type of Home: House Home Access: Level entry     Home Layout: Laundry or work area in [place]: Environmental consultant - 2 wheels;Cane - single point      Prior Function Level of Independence: Independent         Comments: independent in mobility, ADLs, works fulltime at [employer]: Right    Extremity/Trunk Assessment   Upper Extremity Assessment Upper Extremity Assessment: Defer to OT evaluation    Lower Extremity Assessment Lower Extremity Assessment: Overall WFL for tasks assessed(symmetrical, AROM WNL, strength grossly 4+/5, denies  numbness/tingling)    Cervical / Trunk Assessment Cervical / Trunk Assessment: Normal  Communication   Communication: No difficulties  Cognition Arousal/Alertness: Awake/alert Behavior During Therapy: WFL for tasks assessed/performed Overall  Cognitive Status: Within Functional Limits for tasks assessed         General Comments      Exercises     Assessment/Plan    PT Assessment Patent does not need any further PT services  PT Problem List         PT Treatment Interventions      PT Goals (Current goals can be found in the Care Plan section)  Acute Rehab PT Goals Patient Stated Goal: home hopefully today PT Goal Formulation: With patient Time For Goal Achievement: 08/24/19 Potential to Achieve Goals: Good    Frequency     Barriers to discharge        Co-evaluation               AM-PAC PT "6 Clicks" Mobility  Outcome Measure Help needed turning from your back to your side while in a flat bed without using bedrails?: None Help needed moving from lying on your back to sitting on the side of a flat bed without using bedrails?: None Help needed moving to and from a bed to a chair (including a wheelchair)?: None Help needed standing up from a chair using your arms (e.g., wheelchair or bedside chair)?: None Help needed to walk in hospital room?: None Help needed climbing 3-5 steps with a railing? : None 6 Click Score: 24    End of Session   Activity Tolerance: Patient tolerated treatment well Patient left: in chair;with call bell/phone within reach Nurse Communication: Mobility status PT Visit Diagnosis: Other symptoms and signs involving the nervous system (R29.898)    Time: 5830-9407 PT Time Calculation (min) (ACUTE ONLY): 14 min   Charges:   PT Evaluation $PT Eval Low Complexity: 1 Low          Tori Tevis Dunavan PT, DPT 08/24/19, 12:13 PM [phone]

## 2019-08-24 NOTE — Progress Notes (Signed)
SLP Cancellation Note  Patient Details Name: Ronnie Walker MRN: [id number] DOB: 10-23-1950   Cancelled treatment:       Reason Eval/Treat Not Completed: SLP screened, no needs identified, will sign off . Pt's cognitive/linguistic functioning is at baseline. Thank you for this referral,  Chimaobi Casebolt H. Roddie Mc, CCC-SLP Speech Language Pathologist   Wende Bushy 08/24/2019, 3:15 PM

## 2019-08-24 NOTE — Discharge Summary (Signed)
Physician Discharge Summary  Ronnie Walker TKW:[id number] DOB: 02-04-1951 DOA: 08/23/2019  PCP: Kathyrn Drown, MD  Admit date: 08/23/2019 Discharge date: 08/24/2019  Time spent: 30 minutes  Recommendations for Outpatient Follow-up:  1. Repeat basic metabolic panel to follow electrolytes and renal function 2. Reassess blood pressure and if needed start treatment with antihypertensive agents.   Discharge Diagnoses:  Principal Problem:   Acute ischemic stroke [facility]) Active Problems:   Elevated blood pressure reading   Gastroesophageal reflux disease   Discharge Condition: Stable and improved.  No residual deficit appreciated.  Patient instructed to follow-up with PCP in 10 days and to follow-up with neurology service in 4 weeks.  CODE STATUS: Full code.  Diet recommendation: Heart healthy diet.  Filed Weights   08/23/19 1532  Weight: 65.8 kg    History of present illness:  As per H&P written by Dr. Myna Hidalgo on 08/23/2019 69 y.o. male who reports history of hyperlipidemia and COVID-19 infection in March 2021, now presenting to the emergency department for evaluation of right-sided numbness and tingling that began last night.  Patient reports that he been in his usual state of health until approximately 9 PM last night when he was getting ready to eat dinner and noticed some numbness involving the right side of his face, mainly around the mouth and described as similar to when he has received a nerve block at the dentist office.  He went on to eat his dinner without any difficulty swallowing, choking, or coughing, and began to notice numbness and tingling involving the right hand and then a "funny sensation" involving the right leg.  Symptoms persist but now feel as though they are improving.  There is no associated headache, change in vision or hearing, or weakness.  He has never experienced this previously.  Denies any history of chest pain or palpitations.  Reports that his Covid symptoms were  mild, and included nonproductive cough and fatigue, and have completely resolved.  He does not currently take any medications but is not necessarily opposed to it if any were to be recommended.  ED Course: Upon arrival to the ED, patient is found to be afebrile, saturating well on room air, and hypertensive to 180/87.  EKG features sinus rhythm and noncontrast head CT is negative.  Chemistry panel and CBC are unremarkable.  MRI brain is concerning for a small focus of acute ischemia within the left thalamus without hemorrhage or mass-effect.  MRA head is negative.  COVID-19 PCR is pending though the patient has completely recovered from mild infection in March and will not require isolation.  Hospital Course:  Acute ischemic left thalamus stroke -No residual deficits. -Minimal carotid artery obstruction. -Normal 2D echo. -Patient with antegrade vertebral flow bilaterally. -No using any secondary prevention or risk factor modifications therapy prior to admission. -Discussed with neurology service who has recommended full dose aspirin on daily basis along with the use of statins.  Follow-up in 4 weeks with Dr. Merlene Laughter. -Discharged on full dose aspirin, Lipitor and instruction for heart healthy diet. -History of origin appears to be small vessel disease.  Elevated blood pressure: No prior history of hypertension -Reassess vital signs at follow-up visit if needed start treatment with losartan. -Heart healthy diet has been encouraged -Patient advised to maintain adequate hydration.  GERD/GI prophylaxis -Discharge on low-dose Protonix on daily basis.  Procedures: See below for x-ray reports  US carotid bilateral 1. Small amount of plaque at the right carotid bulb and proximal right internal carotid  artery. Estimated degree of stenosis in the right internal carotid artery is less than 50%. 2. Intimal thickening in the left carotid arteries without significant stenosis. 3. Patent vertebral  arteries with antegrade flow.  2-D echo 1. Left ventricular ejection fraction, by estimation, is 60 to 65%. The  left ventricle has normal function. The left ventricle has no regional  wall motion abnormalities. There is mild left ventricular hypertrophy.  Left ventricular diastolic parameters  were normal.  2. Right ventricular systolic function is normal. The right ventricular  size is normal. Tricuspid regurgitation signal is inadequate for assessing  PA pressure.  3. The mitral valve is grossly normal. Trivial mitral valve  regurgitation.  4. The aortic valve is tricuspid. Aortic valve regurgitation is not  visualized.  5. The inferior vena cava is normal in size with greater than 50%  respiratory variability, suggesting right atrial pressure of 3 mmHg.   Consultations:  Dr. Merlene Laughter curbside (recommendations given for full dose aspirin and statins for risk factors modification) outpatient follow-up within 4 weeks.  Discharge Exam: Vitals:   08/24/19 1000 08/24/19 1400  BP: 129/67 (!) 141/76  Pulse: 67 65  Resp: 18 18  Temp: 98.1 F (36.7 C) 98.1 F (36.7 C)  SpO2: 99% 100%    General: Afebrile, no chest pain, no nausea, no vomiting.  Patient reports no shortness of breath.  No significant neurologic or motor deficit on examination.  Feeling ready to go home. Cardiovascular: S1 and S2, no rubs, no gallops, no murmurs.  No JVD. Respiratory: No using accessory muscles.  Good air movement bilaterally.  Clear to auscultation. Abdomen: Soft, nontender, distended, positive bowel sounds Extremities: No cyanosis or clubbing.  Discharge Instructions   Discharge Instructions    Diet - low sodium heart healthy   Complete by: As directed    Discharge instructions   Complete by: As directed    Take medications as prescribed Maintain adequate hydration Follow heart healthy diet Follow-up with PCP in 10 days Follow-up with Dr. Merlene Laughter (neurology service) in 4 weeks.      Allergies as of 08/24/2019   No Known Allergies     Medication List    TAKE these medications   aspirin EC 325 MG tablet Take 1 tablet (325 mg total) by mouth daily.   atorvastatin 40 MG tablet Commonly known as: LIPITOR Take 1 tablet (40 mg total) by mouth daily. Start taking on: August 25, 2019   pantoprazole 20 MG tablet Commonly known as: Protonix Take 1 tablet (20 mg total) by mouth daily.      No Known Allergies Follow-up Information    Kathyrn Drown, MD. Schedule an appointment as soon as possible for a visit in 10 day(s).   Specialty: Family Medicine Contact information: [place] [address] [phone]        Phillips Odor, MD. Schedule an appointment as soon as possible for a visit in 4 week(s).   Specialty: Neurology Contact information: [address] [place] [address] [phone]           The results of significant diagnostics from this hospitalization (including imaging, microbiology, ancillary and laboratory) are listed below for reference.    Significant Diagnostic Studies: DG Eye Foreign Body  Result Date: 08/23/2019 CLINICAL DATA:  Metal working/exposure; clearance prior to MRI EXAM: ORBITS FOR FOREIGN BODY - 2 VIEW COMPARISON:  None FINDINGS: No or metallic foreign bodies. Visualized paranasal sinuses clear. Osseous structures unremarkable. IMPRESSION: No orbital metallic  foreign bodies. Electronically Signed   By: Lavonia Dana M.D.   On: 08/23/2019 18:12   CT Head Wo Contrast  Result Date: 08/23/2019 CLINICAL DATA:  Onset right facial tingling and ataxia last night at 9 p.m. EXAM: CT HEAD WITHOUT CONTRAST TECHNIQUE: Contiguous axial images were obtained from the base of the skull through the vertex without intravenous contrast. COMPARISON:  None. FINDINGS: Brain: No evidence of acute infarction, hemorrhage, hydrocephalus, extra-axial collection or mass lesion/mass effect. Vascular: No hyperdense vessel or  unexpected calcification. Skull: Intact.  No focal lesion. Sinuses/Orbits: Negative. Other: None. IMPRESSION: Negative head CT. Electronically Signed   By: Inge Rise M.D.   On: 08/23/2019 16:13   MR ANGIO HEAD WO CONTRAST  Result Date: 08/23/2019 CLINICAL DATA:  stroke follow-up EXAM: MRI HEAD WITHOUT CONTRAST MRA HEAD WITHOUT CONTRAST TECHNIQUE: Multiplanar, multiecho pulse sequences of the brain and surrounding structures were obtained without intravenous contrast. Angiographic images of the head were obtained using MRA technique without contrast. COMPARISON:  None. FINDINGS: MRI HEAD FINDINGS BRAIN: Small focus of abnormal diffusion restriction within the left thalamus. No hemorrhage or mass effect. Multifocal white matter hyperintensity, most commonly due to chronic ischemic microangiopathy. The cerebral and cerebellar volume are age-appropriate. There is no hydrocephalus. Blood-sensitive sequences show no chronic microhemorrhage or superficial siderosis. The midline structures are normal. SKULL AND UPPER CERVICAL SPINE: The visualized skull base, calvarium, upper cervical spine and extracranial soft tissues are normal. SINUSES/ORBITS: No fluid levels or advanced mucosal thickening. No mastoid or middle ear effusion. The orbits are normal. MRA HEAD FINDINGS POSTERIOR CIRCULATION: --Basilar artery: Normal. --Posterior cerebral arteries: Bilateral fetal origins. No acute abnormality. --Superior cerebellar arteries: Normal. --Inferior cerebellar arteries: Normal anterior and posterior inferior cerebellar arteries. ANTERIOR CIRCULATION: --Intracranial internal carotid arteries: Normal. --Anterior cerebral arteries: Normal. Both A1 segments are present. Patent anterior communicating artery. --Middle cerebral arteries: Normal. --Posterior communicating arteries: Present bilaterally. IMPRESSION: 1. Small focus of acute ischemia within the left thalamus. No hemorrhage or mass effect. 2. Normal intracranial  MRA. Electronically Signed   By: Ulyses Jarred M.D.   On: 08/23/2019 19:15   MR BRAIN WO CONTRAST  Result Date: 08/23/2019 CLINICAL DATA:  stroke follow-up EXAM: MRI HEAD WITHOUT CONTRAST MRA HEAD WITHOUT CONTRAST TECHNIQUE: Multiplanar, multiecho pulse sequences of the brain and surrounding structures were obtained without intravenous contrast. Angiographic images of the head were obtained using MRA technique without contrast. COMPARISON:  None. FINDINGS: MRI HEAD FINDINGS BRAIN: Small focus of abnormal diffusion restriction within the left thalamus. No hemorrhage or mass effect. Multifocal white matter hyperintensity, most commonly due to chronic ischemic microangiopathy. The cerebral and cerebellar volume are age-appropriate. There is no hydrocephalus. Blood-sensitive sequences show no chronic microhemorrhage or superficial siderosis. The midline structures are normal. SKULL AND UPPER CERVICAL SPINE: The visualized skull base, calvarium, upper cervical spine and extracranial soft tissues are normal. SINUSES/ORBITS: No fluid levels or advanced mucosal thickening. No mastoid or middle ear effusion. The orbits are normal. MRA HEAD FINDINGS POSTERIOR CIRCULATION: --Basilar artery: Normal. --Posterior cerebral arteries: Bilateral fetal origins. No acute abnormality. --Superior cerebellar arteries: Normal. --Inferior cerebellar arteries: Normal anterior and posterior inferior cerebellar arteries. ANTERIOR CIRCULATION: --Intracranial internal carotid arteries: Normal. --Anterior cerebral arteries: Normal. Both A1 segments are present. Patent anterior communicating artery. --Middle cerebral arteries: Normal. --Posterior communicating arteries: Present bilaterally. IMPRESSION: 1. Small focus of acute ischemia within the left thalamus. No hemorrhage or mass effect. 2. Normal intracranial MRA. Electronically Signed   By: Cletus Gash.D.  On: 08/23/2019 19:15   ECHOCARDIOGRAM COMPLETE  Result Date: 08/24/2019     ECHOCARDIOGRAM REPORT   Patient Name:   Ronnie Walker Wildey Date of Exam: 08/24/2019 Medical Rec #:  [id number]     Height:       66.0 in Accession #:    [id number]    Weight:       145.0 lb Date of Birth:  Dec 29, 1950    BSA:          1.744 m Patient Age:    35 years      BP:           149/81 mmHg Patient Gender: M             HR:           70 bpm. Exam Location:  [facility] Procedure: 2D Echo Indications:    Stroke 434.91 / I163.9  History:        Patient has no prior history of Echocardiogram examinations.                 Risk Factors:Dyslipidemia and Former Smoker. Acute Ischemic                 Stroke.  Sonographer:    Leavy Cella RDCS (AE) Referring Phys: [id number] [place]  1. Left ventricular ejection fraction, by estimation, is 60 to 65%. The left ventricle has normal function. The left ventricle has no regional wall motion abnormalities. There is mild left ventricular hypertrophy. Left ventricular diastolic parameters were normal.  2. Right ventricular systolic function is normal. The right ventricular size is normal. Tricuspid regurgitation signal is inadequate for assessing PA pressure.  3. The mitral valve is grossly normal. Trivial mitral valve regurgitation.  4. The aortic valve is tricuspid. Aortic valve regurgitation is not visualized.  5. The inferior vena cava is normal in size with greater than 50% respiratory variability, suggesting right atrial pressure of 3 mmHg. FINDINGS  Left Ventricle: Left ventricular ejection fraction, by estimation, is 60 to 65%. The left ventricle has normal function. The left ventricle has no regional wall motion abnormalities. The left ventricular internal cavity size was normal in size. There is  mild left ventricular hypertrophy. Left ventricular diastolic parameters were normal. Right Ventricle: The right ventricular size is normal. No increase in right ventricular wall thickness. Right ventricular systolic function is normal. Tricuspid regurgitation  signal is inadequate for assessing PA pressure. Left Atrium: Left atrial size was normal in size. Right Atrium: Right atrial size was normal in size. Pericardium: There is no evidence of pericardial effusion. Mitral Valve: The mitral valve is grossly normal. Trivial mitral valve regurgitation. Tricuspid Valve: The tricuspid valve is grossly normal. Tricuspid valve regurgitation is trivial. Aortic Valve: The aortic valve is tricuspid. Aortic valve regurgitation is not visualized. Mild aortic valve annular calcification. Pulmonic Valve: The pulmonic valve was grossly normal. Pulmonic valve regurgitation is trivial. Aorta: The aortic root is normal in size and structure. Venous: The inferior vena cava is normal in size with greater than 50% respiratory variability, suggesting right atrial pressure of 3 mmHg. IAS/Shunts: No atrial level shunt detected by color flow Doppler.  LEFT VENTRICLE PLAX 2D LVIDd:         4.41 cm  Diastology LVIDs:         2.58 cm  LV e' lateral:   11.30 cm/s LV PW:         1.15 cm  LV E/e' lateral: 5.4 LV  IVS:        0.86 cm  LV e' medial:    8.49 cm/s LVOT diam:     2.00 cm  LV E/e' medial:  7.2 LVOT Area:     3.14 cm  RIGHT VENTRICLE RV S prime:     11.60 cm/s TAPSE (M-mode): 2.2 cm LEFT ATRIUM             Index       RIGHT ATRIUM           Index LA diam:        3.10 cm 1.78 cm/m  RA Area:     15.70 cm LA Vol (A2C):   67.5 ml 38.70 ml/m RA Volume:   44.00 ml  25.22 ml/m LA Vol (A4C):   27.3 ml 15.65 ml/m LA Biplane Vol: 44.7 ml 25.63 ml/m   AORTA Ao Root diam: 3.50 cm MITRAL VALVE MV Area (PHT): 2.48 cm    SHUNTS MV Decel Time: 306 msec    Systemic Diam: 2.00 cm MV E velocity: 61.20 cm/s MV A velocity: 43.50 cm/s MV E/A ratio:  1.41 Rozann Lesches MD Electronically signed by Rozann Lesches MD Signature Date/Time: 08/24/2019/11:42:14 AM    Final     Microbiology: Recent Results (from the past 240 hour(s))  SARS Coronavirus 2 by RT PCR (hospital order, performed in [place]  hospital lab) Nasopharyngeal Nasopharyngeal Swab     Status: None   Collection Time: 08/23/19  7:24 PM   Specimen: Nasopharyngeal Swab  Result Value Ref Range Status   SARS Coronavirus 2 NEGATIVE NEGATIVE Final    Comment: (NOTE) SARS-CoV-2 target nucleic acids are NOT DETECTED. The SARS-CoV-2 RNA is generally detectable in upper and lower respiratory specimens during the acute phase of infection. The lowest concentration of SARS-CoV-2 viral copies this assay can detect is 250 copies / mL. A negative result does not preclude SARS-CoV-2 infection and should not be used as the sole basis for treatment or other patient management decisions.  A negative result may occur with improper specimen collection / handling, submission of specimen other than nasopharyngeal swab, presence of viral mutation(s) within the areas targeted by this assay, and inadequate number of viral copies (<250 copies / mL). A negative result must be combined with clinical observations, patient history, and epidemiological information. Fact Sheet for Patients:   StrictlyIdeas.no Fact Sheet for Healthcare Providers: BankingDealers.co.za This test is not yet approved or cleared  by the [place] FDA and has been authorized for detection and/or diagnosis of SARS-CoV-2 by FDA under an Emergency Use Authorization (EUA).  This EUA will remain in effect (meaning this test can be used) for the duration of the COVID-19 declaration under Section 564(b)(1) of the Act, 21 U.S.C. section 360bbb-3(b)(1), unless the authorization is terminated or revoked sooner. Performed at [facility], [address], [place], [place] [address]      Labs: Basic Metabolic Panel: Recent Labs  Lab 08/23/19 1550 08/23/19 1934  NA 137 140  K 4.2 3.8  CL 102 102  CO2 25  --   GLUCOSE 111* 113*  BUN 9 6*  CREATININE 0.79 0.80  CALCIUM 9.0  --    CBC: Recent Labs  Lab 08/23/19 1550  08/23/19 1934 08/24/19 0610  WBC 6.5  --  5.9  NEUTROABS 3.8  --   --   HGB 15.1 15.6 14.3  HCT 44.1 46.0 42.2  MCV 92.1  --  91.7  PLT 214  --  195  Signed:  Barton Dubois MD.  [facility] 08/24/2019, 3:58 PM

## 2019-08-24 NOTE — Evaluation (Signed)
Occupational Therapy Evaluation Patient Details Name: Ronnie Walker MRN: [id number] DOB: 1950-06-03 Today's Date: 08/24/2019    History of Present Illness Ronnie Walker is a 69 y.o. male who reports history of hyperlipidemia and COVID-19 infection in March 2021, now presenting to the emergency department for evaluation of right-sided numbness and tingling that began last night.  Patient reports that he been in his usual state of health until approximately 9 PM last night when he was getting ready to eat dinner and noticed some numbness involving the right side of his face, mainly around the mouth and described as similar to when he has received a nerve block at the dentist office.  He went on to eat his dinner without any difficulty swallowing, choking, or coughing, and began to notice numbness and tingling involving the right hand and then a "funny sensation" involving the right leg.  Symptoms persist but now feel as though they are improving.  There is no associated headache, change in vision or hearing, or weakness.  He has never experienced this previously.  Denies any history of chest pain or palpitations.  Reports that his Covid symptoms were mild, and included nonproductive cough and fatigue, and have completely resolved.  He does not currently take any medications but is not necessarily opposed to it if any were to be recommended. MRI positive for left acute infarct   Clinical Impression   Pt agreeable to OT evaluation, reports continued tingling in right lip area and dorsal hand however has improved since onset. Pt demonstrates BUE strength WNL, coordination and sensation are intact. Pt performing ADLs independently. No further OT services required at this time.     Follow Up Recommendations  No OT follow up    Equipment Recommendations  None recommended by OT       Precautions / Restrictions Precautions Precautions: None Restrictions Weight Bearing Restrictions: No      Mobility  Bed Mobility Overal bed mobility: Independent                Transfers Overall transfer level: Independent Equipment used: None                      ADL either performed or assessed with clinical judgement   ADL Overall ADL's : Independent;At baseline                                             Vision Baseline Vision/History: Wears glasses Wears Glasses: Reading only Patient Visual Report: No change from baseline Vision Assessment?: No apparent visual deficits            Pertinent Vitals/Pain Pain Assessment: No/denies pain     Hand Dominance Right   Extremity/Trunk Assessment Upper Extremity Assessment Upper Extremity Assessment: Overall WFL for tasks assessed   Lower Extremity Assessment Lower Extremity Assessment: Defer to PT evaluation   Cervical / Trunk Assessment Cervical / Trunk Assessment: Normal   Communication Communication Communication: No difficulties   Cognition Arousal/Alertness: Awake/alert Behavior During Therapy: WFL for tasks assessed/performed Overall Cognitive Status: Within Functional Limits for tasks assessed                                                Home  Living Family/patient expects to be discharged to:: Private residence Living Arrangements: Spouse/significant other Available Help at Discharge: Family;Available PRN/intermittently Type of Home: House Home Access: Level entry     Home Layout: Laundry or work area in basement     [employer] Shower/Tub: Teacher, early years/pre: Standard     Home Equipment: Environmental consultant - 2 wheels;Cane - single point          Prior Functioning/Environment Level of Independence: Independent        Comments: independent in mobility, ADLs, works at [employer] List: Impaired sensation       AM-PAC OT "6 Clicks" Daily Activity     Outcome Measure Help from another person eating meals?: None Help from another  person taking care of personal grooming?: None Help from another person toileting, which includes using toliet, bedpan, or urinal?: None Help from another person bathing (including washing, rinsing, drying)?: None Help from another person to put on and taking off regular upper body clothing?: None Help from another person to put on and taking off regular lower body clothing?: None 6 Click Score: 24   End of Session    Activity Tolerance: Patient tolerated treatment well Patient left: in bed;with call bell/phone within reach  OT Visit Diagnosis: Muscle weakness (generalized) (M62.81)                Time: 1292-9090 OT Time Calculation (min): 14 min Charges:  OT General Charges $OT Visit: 1 Visit OT Evaluation $OT Eval Low Complexity: [place], OTR/L  [phone] 08/24/2019, 7:53 AM

## 2019-09-05 ENCOUNTER — Other Ambulatory Visit: Payer: Self-pay

## 2019-09-05 ENCOUNTER — Telehealth: Payer: Self-pay | Admitting: Family Medicine

## 2019-09-05 ENCOUNTER — Ambulatory Visit: Payer: BC Managed Care – PPO | Admitting: Family Medicine

## 2019-09-05 ENCOUNTER — Encounter: Payer: Self-pay | Admitting: Family Medicine

## 2019-09-05 VITALS — BP 134/76 | Temp 97.6°F | Wt 163.2 lb

## 2019-09-05 DIAGNOSIS — Z125 Encounter for screening for malignant neoplasm of prostate: Secondary | ICD-10-CM

## 2019-09-05 DIAGNOSIS — E7849 Other hyperlipidemia: Secondary | ICD-10-CM | POA: Diagnosis not present

## 2019-09-05 DIAGNOSIS — E785 Hyperlipidemia, unspecified: Secondary | ICD-10-CM

## 2019-09-05 DIAGNOSIS — Z8673 Personal history of transient ischemic attack (TIA), and cerebral infarction without residual deficits: Secondary | ICD-10-CM | POA: Diagnosis not present

## 2019-09-05 NOTE — Telephone Encounter (Signed)
Patient has physical in September and needing labs done

## 2019-09-05 NOTE — Progress Notes (Signed)
   Subjective:    Patient ID: Ronnie Walker, male    DOB: 01/21/1951, 69 y.o.   MRN: [id number]  HPI Pt went to [facility] ER on 08/23/19.  Patient was admitted pt was kept overnight for test/observation.  Pt was having numbness and tingling on right side, could tell a difference in the way he was walking. Pt thought he was having a stroke and ER confirmed that he had a mild stoke. Pt was released with no restrictions. Pt states it did take a few days for the numbness to come out in lips. Pt states he has been fine since visit. Pt was put on 325 mg aspirin, 40 mg Atorvastatin, and 20 mg Protonix by ER. (Pt does not wan to be on med, he would like to try to lower cholesterol with diet and exercise.) pt was also instructed to see a neurologist.  Patient states his wife sees a neurologist in [place] he would like to go see that neurologist because his wife is familiar with going there   Review of Systems  Constitutional: Negative for diaphoresis and fatigue.  HENT: Negative for congestion and rhinorrhea.   Respiratory: Negative for cough and shortness of breath.   Cardiovascular: Negative for chest pain and leg swelling.  Gastrointestinal: Negative for abdominal pain and diarrhea.  Skin: Negative for color change and rash.  Neurological: Negative for dizziness and headaches.  Psychiatric/Behavioral: Negative for behavioral problems and confusion.       Objective:   Physical Exam Lungs clear respiratory rate normal heart regular no murmurs blood pressure is good extremities no edema neurologic grossly normal       Assessment & Plan:  1. Other hyperlipidemia Lipid profile ordered continue current medications follow-up in the fall rationale for statin was discussed in detail - PSA - Lipid Profile - Basic Metabolic Panel (BMET)  2. History of arterial ischemic stroke Continue the aspirin as directed.  See neurology He states his wife is seeing Dr.Jaffe and because they are familiar  with him they would like to follow-up with him - Ambulatory referral to Neurology  3. Screening for prostate cancer Screening - PSA - Lipid Profile - Basic Metabolic Panel (BMET)  4. Hyperlipidemia, unspecified hyperlipidemia type Check this in the fall - PSA - Lipid Profile - Basic Metabolic Panel (BMET)  5. Acute ischemic stroke [facility]) error  - Ambulatory referral to Neurology

## 2019-09-05 NOTE — Telephone Encounter (Signed)
The labs that were just ordered today-please see in epic-I told the patient to do these labs before his follow-up office visit in September.  He should wait until early September to do these labs Thank you

## 2019-09-05 NOTE — Telephone Encounter (Signed)
Orders just put in today for lip, liver met 7. Does pt need bw in sept?

## 2019-09-06 ENCOUNTER — Encounter: Payer: Self-pay | Admitting: Family Medicine

## 2019-09-08 ENCOUNTER — Encounter: Payer: Self-pay | Admitting: Neurology

## 2019-09-28 NOTE — Progress Notes (Signed)
NEUROLOGY CONSULTATION NOTE  Ronnie Walker MRN: [id number] DOB: [birth year]  Referring provider: Sallee Lange, MD Primary care provider: Sallee Lange, MD  Reason for consult:  stroke  HISTORY OF PRESENT ILLNESS: Ronnie Walker is a 69 year old right-handed male with HLD who presents for recent stroke.  History supplemented by hospital and PCP records.  Patient was admitted to [facility] on 08/23/2019 after presenting with right sided numbness and tingling that began the previous night.  He initially noted paresthesias on the right side of his mouth, followed by paresthesias involving the right hand.  No associated headache, weakness, dizziness, visual disturbance or abnormal gait.  Blood pressure in the ED was 180/87.  EKG showed sinus rhythm.  CT head personally reviewed was negative for acute intracranial abnormality but MRI of brain personally reviewed demonstrated a small acute ischemic infarct within the left thalamus.  He did not receive tPA due to outside window and improving symptoms.  MRA of head was negative for large vessel occlusion or significant stenosis.  Carotid ultrasound showed no hemodynamically significant stenosis.  Echocardiogram showed EF 60-65% with no cardiac source of embolus (no thrombus or atrial level shunt). LDL was 179.  Hgb A1c was  6.2.  Tingling and numbness resolved over the next couple of days.    08/23/2019 CT HEAD WO:  Negative head CT 08/23/2019 MRI BRAIN WO:  Small focus of acute inschemia within the left thalamus.  No hemorrhage or mass effect. 08/23/2019 MRA HEAD:  Normal intracranial MRA. 08/24/2019 CAROTID US:  1. Small amount of plaque at the right carotid bulb and proximal right internal carotid artery. Estimated degree of stenosis in the right internal carotid artery is less than 50%.  2. Intimal thickening in the left carotid arteries without significant stenosis.  3. Patent vertebral arteries with antegrade flow. 08/24/2019 ECHOCARDIOGRAM:   1. Left ventricular ejection fraction, by estimation, is 60 to 65%. The left ventricle has normal function. The left ventricle has no regional wall motion abnormalities. There is mild left ventricular hypertrophy. Left ventricular diastolic parameters were normal.  2. Right ventricular systolic function is normal. The right ventricular size is normal. Tricuspid regurgitation signal is inadequate for assessing PA pressure.  3. The mitral valve is grossly normal. Trivial mitral valve regurgitation.  4. The aortic valve is tricuspid. Aortic valve regurgitation is not visualized.  5. The inferior vena cava is normal in size with greater than 50% respiratory variability, suggesting right atrial pressure of 3 mmHg.  PAST MEDICAL HISTORY: Past Medical History:  Diagnosis Date  . Hyperlipidemia     PAST SURGICAL HISTORY: No past surgical history on file.  MEDICATIONS: Current Outpatient Medications on File Prior to Visit  Medication Sig Dispense Refill  . aspirin EC 325 MG tablet Take 1 tablet (325 mg total) by mouth daily. 100 tablet 3  . atorvastatin (LIPITOR) 40 MG tablet Take 1 tablet (40 mg total) by mouth daily. 30 tablet 3  . pantoprazole (PROTONIX) 20 MG tablet Take 1 tablet (20 mg total) by mouth daily. 30 tablet 1   No current facility-administered medications on file prior to visit.    ALLERGIES: No Known Allergies  FAMILY HISTORY: Family History  Problem Relation Age of Onset  . Cancer Mother        breast  . Diabetes Mother   . Hypertension Father   . Heart disease Father     SOCIAL HISTORY: Social History   Socioeconomic History  . Marital status: Married  Spouse name: Not on file  . Number of children: Not on file  . Years of education: Not on file  . Highest education level: Not on file  Occupational History  . Not on file  Tobacco Use  . Smoking status: Former Research scientist (life sciences)  . Smokeless tobacco: Former Systems developer    Quit date: 02/05/2003  Substance and Sexual Activity    . Alcohol use: Not on file  . Drug use: Not on file  . Sexual activity: Not on file  Other Topics Concern  . Not on file  Social History Narrative  . Not on file   Social Determinants of Health   Financial Resource Strain:   . Difficulty of Paying Living Expenses:   Food Insecurity:   . Worried About Charity fundraiser in the Last Year:   . Arboriculturist in the Last Year:   Transportation Needs:   . Film/video editor (Medical):   Marland Kitchen Lack of Transportation (Non-Medical):   Physical Activity:   . Days of Exercise per Week:   . Minutes of Exercise per Session:   Stress:   . Feeling of Stress :   Social Connections:   . Frequency of Communication with Friends and Family:   . Frequency of Social Gatherings with Friends and Family:   . Attends Religious Services:   . Active Member of Clubs or Organizations:   . Attends Archivist Meetings:   Marland Kitchen Marital Status:   Intimate Partner Violence:   . Fear of Current or Ex-Partner:   . Emotionally Abused:   Marland Kitchen Physically Abused:   . Sexually Abused:     PHYSICAL EXAM: Blood pressure (!) 148/80, pulse (!) 54, resp. rate 20, height 5\' 5"  (1.651 m), weight 162 lb (73.5 kg), SpO2 99 %. General: No acute distress.  Patient appears well-groomed.   Head:  Normocephalic/atraumatic Eyes:  fundi examined but not visualized Neck: supple, no paraspinal tenderness, full range of motion Back: No paraspinal tenderness Heart: regular rate and rhythm Lungs: Clear to auscultation bilaterally. Vascular: No carotid bruits. Neurological Exam: Mental status: alert and oriented to person, place, and time, recent and remote memory intact, fund of knowledge intact, attention and concentration intact, speech fluent and not dysarthric, language intact. Cranial nerves: CN I: not tested CN II: pupils equal, round and reactive to light, visual fields intact CN III, IV, VI:  full range of motion, no nystagmus, no ptosis CN V: facial sensation  intact CN VII: upper and lower face symmetric CN VIII: hearing intact CN IX, X: gag intact, uvula midline CN XI: sternocleidomastoid and trapezius muscles intact CN XII: tongue midline Bulk & Tone: normal, no fasciculations. Motor:  5/5 throughout  Sensation:  Pinprick and vibration sensation intact.   Deep Tendon Reflexes:  2+ throughout, toes downgoing.   Finger to nose testing:  Without dysmetria.   Heel to shin:  Without dysmetria.   Gait:  Normal station and stride.  Able to turn and tandem walk. Romberg negative.  IMPRESSION: 1.  Left thalamic infarct, secondary to small vessel disease 2.  Elevated blood pressure 3.  Hyperlipidemia  PLAN: 1.  Aspirin 325mg  daily for secondary stroke prevention 2.  Atorvastatin 40mg  daily (Labs are being rechecked by Dr. Wolfgang Phoenix in September.  LDL goal less than 70) 3.  Blood pressure control.  Elevated today.  Advised to monitor and contact Dr. Wolfgang Phoenix if remains elevated. 4.  Glycemic control (Hgb A1c goal less than 7) 5.  Mediterranean diet  6.  Routine exercise 7.  Follow up in 6 months  Thank you for allowing me to take part in the care of this patient.  Metta Clines, DO  CC: Sallee Lange, MD

## 2019-09-29 ENCOUNTER — Encounter: Payer: Self-pay | Admitting: Neurology

## 2019-09-29 ENCOUNTER — Ambulatory Visit: Payer: BC Managed Care – PPO | Admitting: Neurology

## 2019-09-29 ENCOUNTER — Other Ambulatory Visit: Payer: Self-pay

## 2019-09-29 VITALS — BP 148/80 | HR 54 | Resp 20 | Ht 65.0 in | Wt 162.0 lb

## 2019-09-29 DIAGNOSIS — I639 Cerebral infarction, unspecified: Secondary | ICD-10-CM

## 2019-09-29 DIAGNOSIS — I6381 Other cerebral infarction due to occlusion or stenosis of small artery: Secondary | ICD-10-CM

## 2019-09-29 DIAGNOSIS — E7849 Other hyperlipidemia: Secondary | ICD-10-CM | POA: Diagnosis not present

## 2019-09-29 DIAGNOSIS — R030 Elevated blood-pressure reading, without diagnosis of hypertension: Secondary | ICD-10-CM

## 2019-09-29 NOTE — Patient Instructions (Addendum)
1.  You may reduce aspirin to 81mg  daily 2.  Continue atorvastatin 40mg  daily 3.  Monitor your blood pressure.  If it remains elevated, follow up with Dr. Wolfgang Phoenix 4.  Mediterranean diet 5.  Exercise 6.  Follow up in 6 months.   Mediterranean Diet A Mediterranean diet refers to food and lifestyle choices that are based on the traditions of countries located on the [employer]. This way of eating has been shown to help prevent certain conditions and improve outcomes for people who have chronic diseases, like kidney disease and heart disease. What are tips for following this plan? Lifestyle  Cook and eat meals together with your family, when possible.  Drink enough fluid to keep your urine clear or pale yellow.  Be physically active every day. This includes: ? Aerobic exercise like running or swimming. ? Leisure activities like gardening, walking, or housework.  Get 7-8 hours of sleep each night.  If recommended by your health care provider, drink red wine in moderation. This means 1 glass a day for nonpregnant women and 2 glasses a day for men. A glass of wine equals 5 oz (150 mL). Reading food labels   Check the serving size of packaged foods. For foods such as rice and pasta, the serving size refers to the amount of cooked product, not dry.  Check the total fat in packaged foods. Avoid foods that have saturated fat or trans fats.  Check the ingredients list for added sugars, such as corn syrup. Shopping  At the grocery store, buy most of your food from the areas near the walls of the store. This includes: ? Fresh fruits and vegetables (produce). ? Grains, beans, nuts, and seeds. Some of these may be available in unpackaged forms or large amounts (in bulk). ? Fresh seafood. ? Poultry and eggs. ? Low-fat dairy products.  Buy whole ingredients instead of prepackaged foods.  Buy fresh fruits and vegetables in-season from local farmers markets.  Buy frozen fruits and  vegetables in resealable bags.  If you do not have access to quality fresh seafood, buy precooked frozen shrimp or canned fish, such as tuna, salmon, or sardines.  Buy small amounts of raw or cooked vegetables, salads, or olives from the deli or salad bar at your store.  Stock your pantry so you always have certain foods on hand, such as olive oil, canned tuna, canned tomatoes, rice, pasta, and beans. Cooking  Cook foods with extra-virgin olive oil instead of using butter or other vegetable oils.  Have meat as a side dish, and have vegetables or grains as your main dish. This means having meat in small portions or adding small amounts of meat to foods like pasta or stew.  Use beans or vegetables instead of meat in common dishes like chili or lasagna.  Experiment with different cooking methods. Try roasting or broiling vegetables instead of steaming or sauteing them.  Add frozen vegetables to soups, stews, pasta, or rice.  Add nuts or seeds for added healthy fat at each meal. You can add these to yogurt, salads, or vegetable dishes.  Marinate fish or vegetables using olive oil, lemon juice, garlic, and fresh herbs. Meal planning   Plan to eat 1 vegetarian meal one day each week. Try to work up to 2 vegetarian meals, if possible.  Eat seafood 2 or more times a week.  Have healthy snacks readily available, such as: ? Vegetable sticks with hummus. ? [place] yogurt. ? Fruit and nut trail mix.  Eat balanced meals throughout the week. This includes: ? Fruit: 2-3 servings a day ? Vegetables: 4-5 servings a day ? Low-fat dairy: 2 servings a day ? Fish, poultry, or lean meat: 1 serving a day ? Beans and legumes: 2 or more servings a week ? Nuts and seeds: 1-2 servings a day ? Whole grains: 6-8 servings a day ? Extra-virgin olive oil: 3-4 servings a day  Limit red meat and sweets to only a few servings a month What are my food choices?  Mediterranean diet ? Recommended  Grains:  Whole-grain pasta. Brown rice. Bulgar wheat. Polenta. Couscous. Whole-wheat bread. [place].  Vegetables: Artichokes. Beets. Broccoli. Cabbage. Carrots. Eggplant. Green beans. Chard. Kale. Spinach. Onions. Leeks. Peas. Squash. Tomatoes. Peppers. Radishes.  Fruits: Apples. Apricots. Avocado. Berries. Bananas. Cherries. Dates. Figs. Grapes. Lemons. Melon. Oranges. Peaches. Plums. Pomegranate.  Meats and other protein foods: Beans. Almonds. Sunflower seeds. Pine nuts. Peanuts. [place]. Salmon. Scallops. Shrimp. [place]. Tilapia. Clams. Oysters. Eggs.  Dairy: Low-fat milk. Cheese. Greek yogurt.  Beverages: Water. Red wine. Herbal tea.  Fats and oils: Extra virgin olive oil. Avocado oil. Grape seed oil.  Sweets and desserts: [place] yogurt with honey. Baked apples. Poached pears. Trail mix.  Seasoning and other foods: Basil. Cilantro. Coriander. Cumin. Mint. Parsley. Sage. Rosemary. Tarragon. Garlic. Oregano. Thyme. Pepper. Balsalmic vinegar. Tahini. Hummus. Tomato sauce. Olives. Mushrooms. ? Limit these  Grains: Prepackaged pasta or rice dishes. Prepackaged cereal with added sugar.  Vegetables: Deep fried potatoes (french fries).  Fruits: Fruit canned in syrup.  Meats and other protein foods: Beef. Pork. Lamb. Poultry with skin. Hot dogs. Berniece Salines.  Dairy: Ice cream. Sour cream. Whole milk.  Beverages: Juice. Sugar-sweetened soft drinks. Beer. Liquor and spirits.  Fats and oils: Butter. Canola oil. Vegetable oil. Beef fat (tallow). Lard.  Sweets and desserts: Cookies. Cakes. Pies. Candy.  Seasoning and other foods: Mayonnaise. Premade sauces and marinades. The items listed may not be a complete list. Talk with your dietitian about what dietary choices are right for you. Summary  The Mediterranean diet includes both food and lifestyle choices.  Eat a variety of fresh fruits and vegetables, beans, nuts, seeds, and whole grains.  Limit the amount of red meat and sweets that you  eat.  Talk with your health care provider about whether it is safe for you to drink red wine in moderation. This means 1 glass a day for nonpregnant women and 2 glasses a day for men. A glass of wine equals 5 oz (150 mL). This information is not intended to replace advice given to you by your health care provider. Make sure you discuss any questions you have with your health care provider. Document Revised: 11/01/2015 Document Reviewed: 10/25/2015 Elsevier Patient Education  [place].

## 2019-11-30 DIAGNOSIS — Z125 Encounter for screening for malignant neoplasm of prostate: Secondary | ICD-10-CM | POA: Diagnosis not present

## 2019-11-30 DIAGNOSIS — E7849 Other hyperlipidemia: Secondary | ICD-10-CM | POA: Diagnosis not present

## 2019-11-30 DIAGNOSIS — E785 Hyperlipidemia, unspecified: Secondary | ICD-10-CM | POA: Diagnosis not present

## 2019-12-01 LAB — LIPID PANEL
Chol/HDL Ratio: 3.7 ratio (ref 0.0–5.0)
Cholesterol, Total: 197 mg/dL (ref 100–199)
HDL: 53 mg/dL (ref >39)
LDL Chol Calc (NIH): 117 mg/dL — ABNORMAL HIGH (ref 0–99)
Triglycerides: 154 mg/dL — ABNORMAL HIGH (ref 0–149)
VLDL Cholesterol Cal: 27 mg/dL (ref 5–40)

## 2019-12-01 LAB — BASIC METABOLIC PANEL
BUN/Creatinine Ratio: 8 — ABNORMAL LOW (ref 10–24)
BUN: 7 mg/dL — ABNORMAL LOW (ref 8–27)
CO2: 24 mmol/L (ref 20–29)
Calcium: 9.3 mg/dL (ref 8.6–10.2)
Chloride: 102 mmol/L (ref 96–106)
Creatinine, Ser: 0.84 mg/dL (ref 0.76–1.27)
GFR calc Af Amer: 105 mL/min/{1.73_m2} (ref >59)
GFR calc non Af Amer: 91 mL/min/{1.73_m2} (ref >59)
Glucose: 118 mg/dL — ABNORMAL HIGH (ref 65–99)
Potassium: 3.9 mmol/L (ref 3.5–5.2)
Sodium: 139 mmol/L (ref 134–144)

## 2019-12-01 LAB — PSA: Prostate Specific Ag, Serum: 0.4 ng/mL (ref 0.0–4.0)

## 2019-12-06 ENCOUNTER — Other Ambulatory Visit: Payer: Self-pay

## 2019-12-06 ENCOUNTER — Ambulatory Visit (INDEPENDENT_AMBULATORY_CARE_PROVIDER_SITE_OTHER): Payer: BC Managed Care – PPO | Admitting: Family Medicine

## 2019-12-06 VITALS — BP 122/80 | Temp 97.2°F | Ht 66.0 in | Wt 162.8 lb

## 2019-12-06 DIAGNOSIS — K4021 Bilateral inguinal hernia, without obstruction or gangrene, recurrent: Secondary | ICD-10-CM | POA: Diagnosis not present

## 2019-12-06 DIAGNOSIS — E7849 Other hyperlipidemia: Secondary | ICD-10-CM | POA: Diagnosis not present

## 2019-12-06 DIAGNOSIS — Z23 Encounter for immunization: Secondary | ICD-10-CM

## 2019-12-06 DIAGNOSIS — Z79899 Other long term (current) drug therapy: Secondary | ICD-10-CM | POA: Diagnosis not present

## 2019-12-06 DIAGNOSIS — Z0000 Encounter for general adult medical examination without abnormal findings: Secondary | ICD-10-CM

## 2019-12-06 DIAGNOSIS — Z1211 Encounter for screening for malignant neoplasm of colon: Secondary | ICD-10-CM | POA: Diagnosis not present

## 2019-12-06 MED ORDER — ZOSTER VAC RECOMB ADJUVANTED 50 MCG/0.5ML IM SUSR: 0.5000 mL | Freq: Once | INTRAMUSCULAR | 0 refills | Status: AC

## 2019-12-06 MED ORDER — ROSUVASTATIN CALCIUM 40 MG PO TABS: 40.0000 mg | ORAL_TABLET | Freq: Every day | ORAL | 1 refills | Status: DC

## 2019-12-06 NOTE — Progress Notes (Signed)
Subjective:    Patient ID: Ronnie Walker, male    DOB: [birth year], 69 y.o.   MRN: [id number]  HPI  The patient comes in today for a wellness visit.    A review of their health history was completed.  A review of medications was also completed.  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   Additional concerns: possible hernias in abdomen and groin   Review of Systems  Constitutional: Negative for activity change, appetite change and fever.  HENT: Negative for congestion and rhinorrhea.   Eyes: Negative for discharge.  Respiratory: Negative for cough and wheezing.   Cardiovascular: Negative for chest pain.  Gastrointestinal: Negative for abdominal pain, blood in stool and vomiting.  Genitourinary: Negative for difficulty urinating and frequency.  Musculoskeletal: Negative for neck pain.  Skin: Negative for rash.  Allergic/Immunologic: Negative for environmental allergies and food allergies.  Neurological: Negative for weakness and headaches.  Psychiatric/Behavioral: Negative for agitation.       Objective:   Physical Exam Constitutional:      Appearance: He is well-developed.  HENT:     Head: Normocephalic and atraumatic.     Right Ear: External ear normal.     Left Ear: External ear normal.     Nose: Nose normal.  Eyes:     Pupils: Pupils are equal, round, and reactive to light.  Neck:     Thyroid: No thyromegaly.  Cardiovascular:     Rate and Rhythm: Normal rate and regular rhythm.     Heart sounds: Normal heart sounds. No murmur heard.   Pulmonary:     Effort: Pulmonary effort is normal. No respiratory distress.     Breath sounds: Normal breath sounds. No wheezing.  Abdominal:     General: Bowel sounds are normal. There is no distension.      Palpations: Abdomen is soft. There is no mass.     Tenderness: There is no abdominal tenderness.  Genitourinary:    Penis: Normal.      Prostate: Normal.  Musculoskeletal:        General: Normal range of motion.     Cervical back: Normal range of motion and neck supple.  Lymphadenopathy:     Cervical: No cervical adenopathy.  Skin:    General: Skin is warm and dry.     Findings: No erythema.  Neurological:     Mental Status: He is alert.     Motor: No abnormal muscle tone.  Psychiatric:        Behavior: Behavior normal.        Judgment: Judgment normal.      Patient with significant bilateral hernia and umbilical hernia     Assessment & Plan:  1. Bilateral recurrent inguinal hernia without obstruction or gangrene  No gangrene.  Recommend referral to general surgery [facility]. - Ambulatory referral to General Surgery  2. Well adult exam Adult wellness-complete.wellness physical was conducted today. Importance of diet and exercise were discussed in detail.  In addition to this a discussion regarding safety was also covered. We also reviewed over immunizations and gave recommendations regarding current immunization needed for age.  In addition to this additional areas were also touched on including: Preventative health exams needed:  Colonoscopy this is recommended.  We will go ahead  with referral  Patient was advised yearly wellness exam   3. Other hyperlipidemia LDL not where we want it to be switch over to Crestor recheck lab work later this year - Lipid panel - Hepatic function panel  4. High risk medication use Liver function - Hepatic function panel  5. Screening for colon cancer Referral for colonoscopy - Ambulatory referral to Gastroenterology  6. Need for vaccination Flu vaccine pneumonia vaccine - Flu Vaccine QUAD High Dose(Fluad) - Pneumococcal polysaccharide vaccine 23-valent greater than or equal to 2yo subcutaneous/IM Patient has  already had Covid He is thinking about doing a shot in the coming weeks This was discussed and recommended

## 2019-12-06 NOTE — Patient Instructions (Signed)
DASH Eating Plan DASH stands for "Dietary Approaches to Stop Hypertension." The DASH eating plan is a healthy eating plan that has been shown to reduce high blood pressure (hypertension). It may also reduce your risk for type 2 diabetes, heart disease, and stroke. The DASH eating plan may also help with weight loss. What are tips for following this plan?  General guidelines  Avoid eating more than 2,300 mg (milligrams) of salt (sodium) a day. If you have hypertension, you may need to reduce your sodium intake to 1,500 mg a day.  Limit alcohol intake to no more than 1 drink a day for nonpregnant women and 2 drinks a day for men. One drink equals 12 oz of beer, 5 oz of wine, or 1 oz of hard liquor.  Work with your health care provider to maintain a healthy body weight or to lose weight. Ask what an ideal weight is for you.  Get at least 30 minutes of exercise that causes your heart to beat faster (aerobic exercise) most days of the week. Activities may include walking, swimming, or biking.  Work with your health care provider or diet and nutrition specialist (dietitian) to adjust your eating plan to your individual calorie needs. Reading food labels   Check food labels for the amount of sodium per serving. Choose foods with less than 5 percent of the Daily Value of sodium. Generally, foods with less than 300 mg of sodium per serving fit into this eating plan.  To find whole grains, look for the word "whole" as the first word in the ingredient list. Shopping  Buy products labeled as "low-sodium" or "no salt added."  Buy fresh foods. Avoid canned foods and premade or frozen meals. Cooking  Avoid adding salt when cooking. Use salt-free seasonings or herbs instead of table salt or sea salt. Check with your health care provider or pharmacist before using salt substitutes.  Do not fry foods. Cook foods using healthy methods such as baking, boiling, grilling, and broiling instead.  Cook with  heart-healthy oils, such as olive, canola, soybean, or sunflower oil. Meal planning  Eat a balanced diet that includes: ? 5 or more servings of fruits and vegetables each day. At each meal, try to fill half of your plate with fruits and vegetables. ? Up to 6-8 servings of whole grains each day. ? Less than 6 oz of lean meat, poultry, or fish each day. A 3-oz serving of meat is about the same size as a deck of cards. One egg equals 1 oz. ? 2 servings of low-fat dairy each day. ? A serving of nuts, seeds, or beans 5 times each week. ? Heart-healthy fats. Healthy fats called Omega-3 fatty acids are found in foods such as flaxseeds and coldwater fish, like sardines, salmon, and mackerel.  Limit how much you eat of the following: ? Canned or prepackaged foods. ? Food that is high in trans fat, such as fried foods. ? Food that is high in saturated fat, such as fatty meat. ? Sweets, desserts, sugary drinks, and other foods with added sugar. ? Full-fat dairy products.  Do not salt foods before eating.  Try to eat at least 2 vegetarian meals each week.  Eat more home-cooked food and less restaurant, buffet, and fast food.  When eating at a restaurant, ask that your food be prepared with less salt or no salt, if possible. What foods are recommended? The items listed may not be a complete list. Talk with your dietitian about   what dietary choices are best for you. Grains Whole-grain or whole-wheat bread. Whole-grain or whole-wheat pasta. Brown rice. Oatmeal. Quinoa. Bulgur. Whole-grain and low-sodium cereals. Pita bread. Low-fat, low-sodium crackers. Whole-wheat flour tortillas. Vegetables Fresh or frozen vegetables (raw, steamed, roasted, or grilled). Low-sodium or reduced-sodium tomato and vegetable juice. Low-sodium or reduced-sodium tomato sauce and tomato paste. Low-sodium or reduced-sodium canned vegetables. Fruits All fresh, dried, or frozen fruit. Canned fruit in natural juice (without  added sugar). Meat and other protein foods Skinless chicken or turkey. Ground chicken or turkey. Pork with fat trimmed off. Fish and seafood. Egg whites. Dried beans, peas, or lentils. Unsalted nuts, nut butters, and seeds. Unsalted canned beans. Lean cuts of beef with fat trimmed off. Low-sodium, lean deli meat. Dairy Low-fat (1%) or fat-free (skim) milk. Fat-free, low-fat, or reduced-fat cheeses. Nonfat, low-sodium ricotta or cottage cheese. Low-fat or nonfat yogurt. Low-fat, low-sodium cheese. Fats and oils Soft margarine without trans fats. Vegetable oil. Low-fat, reduced-fat, or light mayonnaise and salad dressings (reduced-sodium). Canola, safflower, olive, soybean, and sunflower oils. Avocado. Seasoning and other foods Herbs. Spices. Seasoning mixes without salt. Unsalted popcorn and pretzels. Fat-free sweets. What foods are not recommended? The items listed may not be a complete list. Talk with your dietitian about what dietary choices are best for you. Grains Baked goods made with fat, such as croissants, muffins, or some breads. Dry pasta or rice meal packs. Vegetables Creamed or fried vegetables. Vegetables in a cheese sauce. Regular canned vegetables (not low-sodium or reduced-sodium). Regular canned tomato sauce and paste (not low-sodium or reduced-sodium). Regular tomato and vegetable juice (not low-sodium or reduced-sodium). Pickles. Olives. Fruits Canned fruit in a light or heavy syrup. Fried fruit. Fruit in cream or butter sauce. Meat and other protein foods Fatty cuts of meat. Ribs. Fried meat. Bacon. Sausage. Bologna and other processed lunch meats. Salami. Fatback. Hotdogs. Bratwurst. Salted nuts and seeds. Canned beans with added salt. Canned or smoked fish. Whole eggs or egg yolks. Chicken or turkey with skin. Dairy Whole or 2% milk, cream, and half-and-half. Whole or full-fat cream cheese. Whole-fat or sweetened yogurt. Full-fat cheese. Nondairy creamers. Whipped toppings.  Processed cheese and cheese spreads. Fats and oils Butter. Stick margarine. Lard. Shortening. Ghee. Bacon fat. Tropical oils, such as coconut, palm kernel, or palm oil. Seasoning and other foods Salted popcorn and pretzels. Onion salt, garlic salt, seasoned salt, table salt, and sea salt. Worcestershire sauce. Tartar sauce. Barbecue sauce. Teriyaki sauce. Soy sauce, including reduced-sodium. Steak sauce. Canned and packaged gravies. Fish sauce. Oyster sauce. Cocktail sauce. Horseradish that you find on the shelf. Ketchup. Mustard. Meat flavorings and tenderizers. Bouillon cubes. Hot sauce and Tabasco sauce. Premade or packaged marinades. Premade or packaged taco seasonings. Relishes. Regular salad dressings. Where to find more information:  National Heart, Lung, and Blood Institute: www.nhlbi.nih.gov  American Heart Association: www.heart.org Summary  The DASH eating plan is a healthy eating plan that has been shown to reduce high blood pressure (hypertension). It may also reduce your risk for type 2 diabetes, heart disease, and stroke.  With the DASH eating plan, you should limit salt (sodium) intake to 2,300 mg a day. If you have hypertension, you may need to reduce your sodium intake to 1,500 mg a day.  When on the DASH eating plan, aim to eat more fresh fruits and vegetables, whole grains, lean proteins, low-fat dairy, and heart-healthy fats.  Work with your health care provider or diet and nutrition specialist (dietitian) to adjust your eating plan to your   individual calorie needs. This information is not intended to replace advice given to you by your health care provider. Make sure you discuss any questions you have with your health care provider. Document Revised: 02/13/2017 Document Reviewed: 02/25/2016 Elsevier Patient Education  2020 Elsevier Inc.  

## 2019-12-15 ENCOUNTER — Encounter: Payer: Self-pay | Admitting: Family Medicine

## 2019-12-19 ENCOUNTER — Encounter: Payer: Self-pay | Admitting: Internal Medicine

## 2020-01-25 DIAGNOSIS — Z87438 Personal history of other diseases of male genital organs: Secondary | ICD-10-CM | POA: Diagnosis not present

## 2020-01-25 DIAGNOSIS — K4000 Bilateral inguinal hernia, with obstruction, without gangrene, not specified as recurrent: Secondary | ICD-10-CM | POA: Diagnosis not present

## 2020-01-25 DIAGNOSIS — K420 Umbilical hernia with obstruction, without gangrene: Secondary | ICD-10-CM | POA: Diagnosis not present

## 2020-01-25 DIAGNOSIS — Z8719 Personal history of other diseases of the digestive system: Secondary | ICD-10-CM | POA: Diagnosis not present

## 2020-01-26 ENCOUNTER — Other Ambulatory Visit: Payer: Self-pay | Admitting: General Surgery

## 2020-01-26 DIAGNOSIS — K4000 Bilateral inguinal hernia, with obstruction, without gangrene, not specified as recurrent: Secondary | ICD-10-CM

## 2020-02-08 ENCOUNTER — Telehealth: Payer: Self-pay

## 2020-02-08 NOTE — Telephone Encounter (Signed)
Per DR. Jaffe please call [facility] to see if the clearence needs to be in witting or on the note sent to [place].   Spoke to [place].Tomi Likens can sign note sent to [place] or send a letter.

## 2020-02-13 NOTE — Telephone Encounter (Signed)
Clearance written on letter sent to [place].  Will fax back to [facility]

## 2020-02-14 ENCOUNTER — Encounter: Payer: Self-pay | Admitting: Gastroenterology

## 2020-02-14 ENCOUNTER — Ambulatory Visit: Payer: BC Managed Care – PPO | Admitting: Gastroenterology

## 2020-02-14 ENCOUNTER — Other Ambulatory Visit: Payer: Self-pay

## 2020-02-14 DIAGNOSIS — Z1211 Encounter for screening for malignant neoplasm of colon: Secondary | ICD-10-CM | POA: Diagnosis not present

## 2020-02-14 DIAGNOSIS — Z0000 Encounter for general adult medical examination without abnormal findings: Secondary | ICD-10-CM

## 2020-02-14 NOTE — Patient Instructions (Signed)
1. Colonoscopy as planned.  See separate instructions.

## 2020-02-14 NOTE — Progress Notes (Addendum)
Primary Care Physician:  Kathyrn Drown, MD  Primary Gastroenterologist: Elon Alas. Abbey Chatters, DO   Chief Complaint  Patient presents with  . Colonoscopy    consult; never had tcs; had "mild" stroke 08/2019    HPI:  Ronnie Walker is a 69 y.o. male here to schedule first-ever colonoscopy.  He had a mild stroke back in June.  Presented with tingling in the right face and right hand.  Seen by neurology.  Patient states he has no restrictions.  He was started on Crestor and aspirin.  According to notes in epic, he recently was given clearance by neurology to undergo hernia repair.    Plans for double inguinal hernia, umbilical hernia repair by Dr. Greer Pickerel, [facility] surgery in the upcoming future.  Has a CT scan scheduled for Friday prior to scheduling surgery.  From GI standpoint, bowel movements are regular for the most part.  Occasionally has diarrhea which he relates to certain foods that he eats.  Denies any abdominal pain.  No nausea or vomiting.  No heartburn.  No weight loss.  No melena or rectal bleeding.     Current Outpatient Medications  Medication Sig Dispense Refill  . aspirin EC 81 MG tablet Take 81 mg by mouth daily. Swallow whole.    . rosuvastatin (CRESTOR) 40 MG tablet Take 1 tablet (40 mg total) by mouth daily. 90 tablet 1   No current facility-administered medications for this visit.    Allergies as of 02/14/2020  . (No Known Allergies)    Past Medical History:  Diagnosis Date  . CVA (cerebral vascular accident) ([place])    08/2019  . Hyperlipidemia     Past Surgical History:  Procedure Laterality Date  . BACK SURGERY    . INGUINAL HERNIA REPAIR Right     Family History  Problem Relation Age of Onset  . Cancer Mother        breast  . Diabetes Mother   . Hypertension Father   . Heart disease Father   . COPD Father   . Stroke Father   . Colon cancer Neg Hx     Social History   Socioeconomic History  . Marital status: Married    Spouse  name: Not on file  . Number of children: Not on file  . Years of education: Not on file  . Highest education level: Not on file  Occupational History  . Not on file  Tobacco Use  . Smoking status: Former Research scientist (life sciences)  . Smokeless tobacco: Former Systems developer    Quit date: 02/05/2003  Vaping Use  . Vaping Use: Never used  Substance and Sexual Activity  . Alcohol use: Yes    Comment: 2-3 beer/day  . Drug use: Never  . Sexual activity: Not on file  Other Topics Concern  . Not on file  Social History Narrative  . Not on file   Social Determinants of Health   Financial Resource Strain:   . Difficulty of Paying Living Expenses: Not on file  Food Insecurity:   . Worried About Charity fundraiser in the Last Year: Not on file  . Ran Out of Food in the Last Year: Not on file  Transportation Needs:   . Lack of Transportation (Medical): Not on file  . Lack of Transportation (Non-Medical): Not on file  Physical Activity:   . Days of Exercise per Week: Not on file  . Minutes of Exercise per Session: Not on file  Stress:   .  Feeling of Stress : Not on file  Social Connections:   . Frequency of Communication with Friends and Family: Not on file  . Frequency of Social Gatherings with Friends and Family: Not on file  . Attends Religious Services: Not on file  . Active Member of Clubs or Organizations: Not on file  . Attends Archivist Meetings: Not on file  . Marital Status: Not on file  Intimate Partner Violence:   . Fear of Current or Ex-Partner: Not on file  . Emotionally Abused: Not on file  . Physically Abused: Not on file  . Sexually Abused: Not on file      ROS:  General: Negative for anorexia, weight loss, fever, chills, fatigue, weakness. Eyes: Negative for vision changes.  ENT: Negative for hoarseness, difficulty swallowing , nasal congestion. CV: Negative for chest pain, angina, palpitations, dyspnea on exertion, peripheral edema.  Respiratory: Negative for dyspnea at  rest, dyspnea on exertion, cough, sputum, wheezing.  GI: See history of present illness. GU:  Negative for dysuria, hematuria, urinary incontinence, urinary frequency, nocturnal urination. See hpi MS: Negative for joint pain, low back pain.  Derm: Negative for rash or itching.  Neuro: Negative for weakness, abnormal sensation, seizure, frequent headaches, memory loss, confusion.  Psych: Negative for anxiety, depression, suicidal ideation, hallucinations.  Endo: Negative for unusual weight change.  Heme: Negative for bruising or bleeding. Allergy: Negative for rash or hives.    Physical Examination:  BP (!) 149/79   Pulse 63   Temp (!) 96.8 F (36 C) (Temporal)   Ht 5\' 6"  (1.676 m)   Wt 164 lb 12.8 oz (74.8 kg)   BMI 26.60 kg/m    General: Well-nourished, well-developed in no acute distress.  Head: Normocephalic, atraumatic.   Eyes: Conjunctiva pink, no icterus. Mouth:masked Neck: Supple without thyromegaly, masses, or lymphadenopathy.  Lungs: Clear to auscultation bilaterally.  Heart: Regular rate and rhythm, no murmurs rubs or gallops.  Abdomen: Bowel sounds are normal, nontender, nondistended, no hepatosplenomegaly or masses, no abdominal bruits, no rebound or guarding.  Small umbilical hernia easily reducible, nontender. Rectal: not perform Extremities: No lower extremity edema. No clubbing or deformities.  Neuro: Alert and oriented x 4 , grossly normal neurologically.  Skin: Warm and dry, no rash or jaundice.   Psych: Alert and cooperative, normal mood and affect.  Labs: Lab Results  Component Value Date   CREATININE 0.84 11/30/2019   BUN 7 (L) 11/30/2019   NA 139 11/30/2019   K 3.9 11/30/2019   CL 102 11/30/2019   CO2 24 11/30/2019   Lab Results  Component Value Date   WBC 5.9 08/24/2019   HGB 14.3 08/24/2019   HCT 42.2 08/24/2019   MCV 91.7 08/24/2019   PLT 195 08/24/2019   Lab Results  Component Value Date   ALT 19 01/24/2013   AST 15 01/24/2013    ALKPHOS 77 01/24/2013   BILITOT 0.4 01/24/2013     Imaging Studies: No results found.  Impression/plan:  69 year old gentleman with history of stroke back in June 2021, presenting to schedule first colonoscopy.  Denies any GI symptoms.  He consumes 2-3 beers per day.  We will plan on deep sedation in the near future. ASA III.  I have discussed the risks, alternatives, benefits with regards to but not limited to the risk of reaction to medication, bleeding, infection, perforation and the patient is agreeable to proceed. Written consent to be obtained.  Patient is aware that if he schedules his hernia  repair prior to his colonoscopy, we would need to wait approximately 6 weeks post hernia repair and when cleared by surgery prior to pursuing colonoscopy.

## 2020-02-15 NOTE — Progress Notes (Signed)
Cc'ed to pcp °

## 2020-02-17 ENCOUNTER — Other Ambulatory Visit: Payer: Self-pay

## 2020-02-17 ENCOUNTER — Ambulatory Visit
Admission: RE | Admit: 2020-02-17 | Discharge: 2020-02-17 | Disposition: A | Discharge status: Home / Self Care | Payer: BC Managed Care – PPO | Source: Ambulatory Visit | Attending: General Surgery | Admitting: General Surgery

## 2020-02-17 DIAGNOSIS — N433 Hydrocele, unspecified: Secondary | ICD-10-CM | POA: Diagnosis not present

## 2020-02-17 DIAGNOSIS — K4020 Bilateral inguinal hernia, without obstruction or gangrene, not specified as recurrent: Secondary | ICD-10-CM | POA: Diagnosis not present

## 2020-02-17 DIAGNOSIS — K4000 Bilateral inguinal hernia, with obstruction, without gangrene, not specified as recurrent: Secondary | ICD-10-CM

## 2020-02-17 DIAGNOSIS — K429 Umbilical hernia without obstruction or gangrene: Secondary | ICD-10-CM | POA: Diagnosis not present

## 2020-02-17 MED ORDER — IOPAMIDOL (ISOVUE-300) INJECTION 61%
100.0000 mL | Freq: Once | INTRAVENOUS | Status: AC | PRN
  Administered 2020-02-17: 100 mL via INTRAVENOUS

## 2020-03-07 ENCOUNTER — Telehealth: Payer: Self-pay

## 2020-03-07 NOTE — Telephone Encounter (Signed)
Patient called office and left voicemail, he's ready to schedule TCS.  Tried to call pt, LMOVM for return call.

## 2020-03-08 NOTE — Telephone Encounter (Signed)
Called pt, he had CT done and will see urology 04/13/20. He would like to have TCS done prior to seeing urology. TCS w/Propofol ASA 3 scheduled with Dr. Abbey Chatters 03/27/20 at 12:45pm.

## 2020-03-08 NOTE — Telephone Encounter (Signed)
Ronnie Walker, Dr. Abbey Chatters will be doing TCS.

## 2020-03-08 NOTE — Telephone Encounter (Signed)
Noted. Patient has not established care with dr. Gala Romney, will switch him to a [facility] patient.

## 2020-03-08 NOTE — Telephone Encounter (Signed)
Pre-op/covid test 03/26/20. Appt letter mailed with procedure instructions.

## 2020-03-22 ENCOUNTER — Encounter (HOSPITAL_COMMUNITY): Payer: Self-pay

## 2020-03-22 NOTE — Patient Instructions (Signed)
Ronnie Walker  03/22/2020     @PREFPERIOPPHARMACY @   Your procedure is scheduled on 03/27/2020.  Report to [facility] at 10:45 A.M.  Call this number if you have problems the morning of surgery:  [phone]   Remember:  Do not eat or drink after midnight.    Please follow the diet and prep instructions given to you by Dr Ave Filter office.     Take these medicines the morning of surgery with A SIP OF WATER :     Do not wear jewelry, make-up or nail polish.  Do not wear lotions, powders, or perfumes, or deodorant.  Do not shave 48 hours prior to surgery.  Men may shave face and neck.  Do not bring valuables to the hospital.  [facility] is not responsible for any belongings or valuables.  Contacts, dentures or bridgework may not be worn into surgery.  Leave your suitcase in the car.  After surgery it may be brought to your room.  For patients admitted to the hospital, discharge time will be determined by your treatment team.  Patients discharged the day of surgery will not be allowed to drive home.   Name and phone number of your driver:   family Special instructions:  n/a  Please read over the following fact sheets that you were given. Care and Recovery After Surgery  Monitored Anesthesia Care Anesthesia is a term that refers to techniques, procedures, and medicines that help a person stay safe and comfortable during a medical procedure. Monitored anesthesia care, or sedation, is one type of anesthesia. Your anesthesia specialist may recommend sedation if you will be having a procedure that does not require you to be unconscious, such as:  Cataract surgery.  A dental procedure.  A biopsy.  A colonoscopy. During the procedure, you may receive a medicine to help you relax (sedative). There are three levels of sedation:  Mild sedation. At this level, you may feel awake and relaxed. You will be able to follow directions.  Moderate sedation. At this level, you will be  sleepy. You may not remember the procedure.  Deep sedation. At this level, you will be asleep. You will not remember the procedure. The more medicine you are given, the deeper your level of sedation will be. Depending on how you respond to the procedure, the anesthesia specialist may change your level of sedation or the type of anesthesia to fit your needs. An anesthesia specialist will monitor you closely during the procedure. Let your health care provider know about:  Any allergies you have.  All medicines you are taking, including vitamins, herbs, eye drops, creams, and over-the-counter medicines.  Any use of steroids (by mouth or as a cream).  Any problems you or family members have had with sedatives and anesthetic medicines.  Any blood disorders you have.  Any surgeries you have had.  Any medical conditions you have, such as sleep apnea.  Whether you are pregnant or may be pregnant.  Any use of cigarettes, alcohol, or street drugs. What are the risks? Generally, this is a safe procedure. However, problems may occur, including:  Getting too much medicine (oversedation).  Nausea.  Allergic reaction to medicines.  Trouble breathing. If this happens, a breathing tube may be used to help with breathing. It will be removed when you are awake and breathing on your own.  Heart trouble.  Lung trouble. Before the procedure Staying hydrated Follow instructions from your health care provider about hydration, which may  include:  Up to 2 hours before the procedure - you may continue to drink clear liquids, such as water, clear fruit juice, black coffee, and plain tea. Eating and drinking restrictions Follow instructions from your health care provider about eating and drinking, which may include:  8 hours before the procedure - stop eating heavy meals or foods such as meat, fried foods, or fatty foods.  6 hours before the procedure - stop eating light meals or foods, such as  toast or cereal.  6 hours before the procedure - stop drinking milk or drinks that contain milk.  2 hours before the procedure - stop drinking clear liquids. Medicines Ask your health care provider about:  Changing or stopping your regular medicines. This is especially important if you are taking diabetes medicines or blood thinners.  Taking medicines such as aspirin and ibuprofen. These medicines can thin your blood. Do not take these medicines before your procedure if your health care provider instructs you not to. Tests and exams  You will have a physical exam.  You may have blood tests done to show: ? How well your kidneys and liver are working. ? How well your blood can clot. General instructions  Plan to have someone take you home from the hospital or clinic.  If you will be going home right after the procedure, plan to have someone with you for 24 hours.  What happens during the procedure?  Your blood pressure, heart rate, breathing, level of pain and overall condition will be monitored.  An IV tube will be inserted into one of your veins.  Your anesthesia specialist will give you medicines as needed to keep you comfortable during the procedure. This may mean changing the level of sedation.  The procedure will be performed. After the procedure  Your blood pressure, heart rate, breathing rate, and blood oxygen level will be monitored until the medicines you were given have worn off.  Do not drive for 24 hours if you received a sedative.  You may: ? Feel sleepy, clumsy, or nauseous. ? Feel forgetful about what happened after the procedure. ? Have a sore throat if you had a breathing tube during the procedure. ? Vomit. This information is not intended to replace advice given to you by your health care provider. Make sure you discuss any questions you have with your health care provider. Document Revised: 02/13/2017 Document Reviewed: 06/24/2015 Elsevier Patient  Education  [place].  Colonoscopy, Adult A colonoscopy is a procedure to look at the entire large intestine. This procedure is done using a long, thin, flexible tube that has a camera on the end. You may have a colonoscopy:  As a part of normal colorectal screening.  If you have certain symptoms, such as: ? A low number of red blood cells in your blood (anemia). ? Diarrhea that does not go away. ? Pain in your abdomen. ? Blood in your stool. A colonoscopy can help screen for and diagnose medical problems, including:  Tumors.  Extra tissue that grows where mucus forms (polyps).  Inflammation.  Areas of bleeding. Tell your health care provider about:  Any allergies you have.  All medicines you are taking, including vitamins, herbs, eye drops, creams, and over-the-counter medicines.  Any problems you or family members have had with anesthetic medicines.  Any blood disorders you have.  Any surgeries you have had.  Any medical conditions you have.  Any problems you have had with having bowel movements.  Whether you  are pregnant or may be pregnant. What are the risks? Generally, this is a safe procedure. However, problems may occur, including:  Bleeding.  Damage to your intestine.  Allergic reactions to medicines given during the procedure.  Infection. This is rare. What happens before the procedure? Eating and drinking restrictions Follow instructions from your health care provider about eating or drinking restrictions, which may include:  A few days before the procedure: ? Follow a low-fiber diet. ? Avoid nuts, seeds, dried fruit, raw fruits, and vegetables.  1-3 days before the procedure: ? Eat only gelatin dessert or ice pops. ? Drink only clear liquids, such as water, clear juice, clear broth or bouillon, black coffee or tea, or clear soft drinks or sports drinks. ? Avoid liquids that contain red or purple dye.  The day of the procedure: ? Do  not eat solid foods. You may continue to drink clear liquids until up to 2 hours before the procedure. ? Do not eat or drink anything starting 2 hours before the procedure, or within the time period that your health care provider recommends. Bowel prep If you were prescribed a bowel prep to take by mouth (orally) to clean out your colon:  Take it as told by your health care provider. Starting the day before your procedure, you will need to drink a large amount of liquid medicine. The liquid will cause you to have many bowel movements of loose stool until your stool becomes almost clear or light green.  If your skin or the opening between the buttocks (anus) gets irritated from diarrhea, you may relieve the irritation using: ? Wipes with medicine in them, such as adult wet wipes with aloe and vitamin E. ? A product to soothe skin, such as petroleum jelly.  If you vomit while drinking the bowel prep: ? Take a break for up to 60 minutes. ? Begin the bowel prep again. ? Call your health care provider if you keep vomiting or you cannot take the bowel prep without vomiting.  To clean out your colon, you may also be given: ? Laxative medicines. These help you have a bowel movement. ? Instructions for enema use. An enema is liquid medicine injected into your rectum. Medicines Ask your health care provider about:  Changing or stopping your regular medicines or supplements. This is especially important if you are taking iron supplements, diabetes medicines, or blood thinners.  Taking medicines such as aspirin and ibuprofen. These medicines can thin your blood. Do not take these medicines unless your health care provider tells you to take them.  Taking over-the-counter medicines, vitamins, herbs, and supplements. General instructions  Ask your health care provider what steps will be taken to help prevent infection. These may include washing skin with a germ-killing soap.  Plan to have someone  take you home from the hospital or clinic. What happens during the procedure?   An IV will be inserted into one of your veins.  You may be given one or more of the following: ? A medicine to help you relax (sedative). ? A medicine to numb the area (local anesthetic). ? A medicine to make you fall asleep (general anesthetic). This is rarely needed.  You will lie on your side with your knees bent.  The tube will: ? Have oil or gel put on it (be lubricated). ? Be inserted into your anus. ? Be gently eased through all parts of your large intestine.  Air will be sent into your colon to keep  it open. This may cause some pressure or cramping.  Images will be taken with the camera and will appear on a screen.  A small tissue sample may be removed to be looked at under a microscope (biopsy). The tissue may be sent to a lab for testing if any signs of problems are found.  If small polyps are found, they may be removed and checked for cancer cells.  When the procedure is finished, the tube will be removed. The procedure may vary among health care providers and hospitals. What happens after the procedure?  Your blood pressure, heart rate, breathing rate, and blood oxygen level will be monitored until you leave the hospital or clinic.  You may have a small amount of blood in your stool.  You may pass gas and have mild cramping or bloating in your abdomen. This is caused by the air that was used to open your colon during the exam.  Do not drive for 24 hours after the procedure.  It is up to you to get the results of your procedure. Ask your health care provider, or the department that is doing the procedure, when your results will be ready. Summary  A colonoscopy is a procedure to look at the entire large intestine.  Follow instructions from your health care provider about eating and drinking before the procedure.  If you were prescribed an oral bowel prep to clean out your colon, take  it as told by your health care provider.  During the colonoscopy, a flexible tube with a camera on its end is inserted into the anus and then passed into the other parts of the large intestine. This information is not intended to replace advice given to you by your health care provider. Make sure you discuss any questions you have with your health care provider. Document Revised: 09/24/2018 Document Reviewed: 09/24/2018 Elsevier Patient Education  2020 [employer].

## 2020-03-26 ENCOUNTER — Encounter (HOSPITAL_COMMUNITY): Payer: Self-pay

## 2020-03-26 ENCOUNTER — Encounter (HOSPITAL_COMMUNITY)
Admission: RE | Admit: 2020-03-26 | Discharge: 2020-03-26 | Disposition: A | Discharge status: Home / Self Care | Payer: BC Managed Care – PPO | Source: Ambulatory Visit | Attending: Internal Medicine | Admitting: Internal Medicine

## 2020-03-26 ENCOUNTER — Other Ambulatory Visit: Payer: Self-pay

## 2020-03-26 ENCOUNTER — Other Ambulatory Visit (HOSPITAL_COMMUNITY)
Admission: RE | Admit: 2020-03-26 | Discharge: 2020-03-26 | Disposition: A | Discharge status: Home / Self Care | Payer: BC Managed Care – PPO | Source: Ambulatory Visit | Attending: Internal Medicine | Admitting: Internal Medicine

## 2020-03-26 DIAGNOSIS — Z79899 Other long term (current) drug therapy: Secondary | ICD-10-CM | POA: Diagnosis not present

## 2020-03-26 DIAGNOSIS — Z01812 Encounter for preprocedural laboratory examination: Secondary | ICD-10-CM | POA: Insufficient documentation

## 2020-03-26 DIAGNOSIS — Z20822 Contact with and (suspected) exposure to covid-19: Secondary | ICD-10-CM | POA: Insufficient documentation

## 2020-03-26 DIAGNOSIS — Z7982 Long term (current) use of aspirin: Secondary | ICD-10-CM | POA: Diagnosis not present

## 2020-03-26 DIAGNOSIS — K648 Other hemorrhoids: Secondary | ICD-10-CM | POA: Diagnosis not present

## 2020-03-26 DIAGNOSIS — Z1211 Encounter for screening for malignant neoplasm of colon: Secondary | ICD-10-CM | POA: Diagnosis not present

## 2020-03-26 DIAGNOSIS — K635 Polyp of colon: Secondary | ICD-10-CM | POA: Diagnosis not present

## 2020-03-26 DIAGNOSIS — Z8673 Personal history of transient ischemic attack (TIA), and cerebral infarction without residual deficits: Secondary | ICD-10-CM | POA: Diagnosis not present

## 2020-03-26 DIAGNOSIS — K5730 Diverticulosis of large intestine without perforation or abscess without bleeding: Secondary | ICD-10-CM | POA: Diagnosis not present

## 2020-03-26 DIAGNOSIS — D120 Benign neoplasm of cecum: Secondary | ICD-10-CM | POA: Diagnosis not present

## 2020-03-26 DIAGNOSIS — Z87891 Personal history of nicotine dependence: Secondary | ICD-10-CM | POA: Diagnosis not present

## 2020-03-26 LAB — SARS CORONAVIRUS 2 (TAT 6-24 HRS): SARS Coronavirus 2: NEGATIVE

## 2020-03-27 ENCOUNTER — Ambulatory Visit (HOSPITAL_COMMUNITY): Payer: BC Managed Care – PPO | Admitting: Anesthesiology

## 2020-03-27 ENCOUNTER — Encounter (HOSPITAL_COMMUNITY)
Admission: RE | Disposition: A | Discharge status: Home / Self Care | Payer: Self-pay | Source: Home / Self Care | Attending: Internal Medicine

## 2020-03-27 ENCOUNTER — Ambulatory Visit (HOSPITAL_COMMUNITY)
Admission: RE | Admit: 2020-03-27 | Discharge: 2020-03-27 | Disposition: A | Discharge status: Home / Self Care | Payer: BC Managed Care – PPO | Attending: Internal Medicine | Admitting: Internal Medicine

## 2020-03-27 ENCOUNTER — Encounter (HOSPITAL_COMMUNITY): Payer: Self-pay

## 2020-03-27 DIAGNOSIS — K635 Polyp of colon: Secondary | ICD-10-CM | POA: Diagnosis not present

## 2020-03-27 DIAGNOSIS — Z8673 Personal history of transient ischemic attack (TIA), and cerebral infarction without residual deficits: Secondary | ICD-10-CM | POA: Insufficient documentation

## 2020-03-27 DIAGNOSIS — Z20822 Contact with and (suspected) exposure to covid-19: Secondary | ICD-10-CM | POA: Insufficient documentation

## 2020-03-27 DIAGNOSIS — Z7982 Long term (current) use of aspirin: Secondary | ICD-10-CM | POA: Diagnosis not present

## 2020-03-27 DIAGNOSIS — Z87891 Personal history of nicotine dependence: Secondary | ICD-10-CM | POA: Insufficient documentation

## 2020-03-27 DIAGNOSIS — D123 Benign neoplasm of transverse colon: Secondary | ICD-10-CM

## 2020-03-27 DIAGNOSIS — D120 Benign neoplasm of cecum: Secondary | ICD-10-CM | POA: Diagnosis not present

## 2020-03-27 DIAGNOSIS — K5730 Diverticulosis of large intestine without perforation or abscess without bleeding: Secondary | ICD-10-CM | POA: Insufficient documentation

## 2020-03-27 DIAGNOSIS — K648 Other hemorrhoids: Secondary | ICD-10-CM | POA: Diagnosis not present

## 2020-03-27 DIAGNOSIS — Z79899 Other long term (current) drug therapy: Secondary | ICD-10-CM | POA: Insufficient documentation

## 2020-03-27 DIAGNOSIS — Z1211 Encounter for screening for malignant neoplasm of colon: Secondary | ICD-10-CM | POA: Diagnosis not present

## 2020-03-27 HISTORY — PX: COLONOSCOPY WITH PROPOFOL: SHX5780

## 2020-03-27 HISTORY — PX: POLYPECTOMY: SHX149

## 2020-03-27 SURGERY — COLONOSCOPY WITH PROPOFOL
Anesthesia: General

## 2020-03-27 MED ORDER — PROPOFOL 500 MG/50ML IV EMUL
INTRAVENOUS | Status: DC | PRN
  Administered 2020-03-27: 150 ug/kg/min via INTRAVENOUS

## 2020-03-27 MED ORDER — PROPOFOL 10 MG/ML IV BOLUS
INTRAVENOUS | Status: DC | PRN
  Administered 2020-03-27: 10 mg via INTRAVENOUS
  Administered 2020-03-27: 20 mg via INTRAVENOUS
  Administered 2020-03-27: 50 mg via INTRAVENOUS
  Administered 2020-03-27: 30 mg via INTRAVENOUS

## 2020-03-27 MED ORDER — PROPOFOL 10 MG/ML IV BOLUS
INTRAVENOUS | Status: AC
  Filled 2020-03-27: qty 80

## 2020-03-27 MED ORDER — LACTATED RINGERS IV SOLN: INTRAVENOUS | Status: DC | PRN

## 2020-03-27 MED ORDER — LACTATED RINGERS IV SOLN: Freq: Once | INTRAVENOUS | Status: AC

## 2020-03-27 NOTE — H&P (Signed)
Primary Care Physician:  Kathyrn Drown, MD Primary Gastroenterologist:  Dr. Abbey Chatters  Pre-Procedure History & Physical: HPI:  Ronnie Walker is a 70 y.o. male is here for a colonoscopy for colon cancer screening purposes.  Patient denies any family history of colorectal cancer.  No melena or hematochezia.  No abdominal pain or unintentional weight loss.  No change in bowel habits.  Overall feels well from a GI standpoint.  Past Medical History:  Diagnosis Date  . CVA (cerebral vascular accident) ([place])    08/2019  . Hyperlipidemia     Past Surgical History:  Procedure Laterality Date  . BACK SURGERY    . INGUINAL HERNIA REPAIR Right     Prior to Admission medications   Medication Sig Start Date End Date Taking? Authorizing Provider  aspirin EC 81 MG tablet Take 81 mg by mouth daily. Swallow whole.   Yes [provider]  rosuvastatin (CRESTOR) 40 MG tablet Take 1 tablet (40 mg total) by mouth daily. 12/06/19  Yes Kathyrn Drown, MD    Allergies as of 03/08/2020  . (No Known Allergies)    Family History  Problem Relation Age of Onset  . Cancer Mother        breast  . Diabetes Mother   . Hypertension Father   . Heart disease Father   . COPD Father   . Stroke Father   . Colon cancer Neg Hx     Social History   Socioeconomic History  . Marital status: Married    Spouse name: Not on file  . Number of children: Not on file  . Years of education: Not on file  . Highest education level: Not on file  Occupational History  . Not on file  Tobacco Use  . Smoking status: Former Research scientist (life sciences)  . Smokeless tobacco: Former Systems developer    Quit date: 02/05/2003  Vaping Use  . Vaping Use: Never used  Substance and Sexual Activity  . Alcohol use: Yes    Comment: 2-3 beer/day  . Drug use: Never  . Sexual activity: Not on file  Other Topics Concern  . Not on file  Social History Narrative  . Not on file   Social Determinants of Health   Financial Resource Strain: Not on file   Food Insecurity: Not on file  Transportation Needs: Not on file  Physical Activity: Not on file  Stress: Not on file  Social Connections: Not on file  Intimate Partner Violence: Not on file    Review of Systems: See HPI, otherwise negative ROS  Impression/Plan: Ronnie Walker is here for a colonoscopy to be performed for colon cancer screening purposes.  The risks of the procedure including infection, bleed, or perforation as well as benefits, limitations, alternatives and imponderables have been reviewed with the patient. Questions have been answered. All parties agreeable.

## 2020-03-27 NOTE — Discharge Instructions (Signed)
°  Colonoscopy Discharge Instructions  Read the instructions outlined below and refer to this sheet in the next few weeks. These discharge instructions provide you with general information on caring for yourself after you leave the hospital. Your doctor may also give you specific instructions. While your treatment has been planned according to the most current medical practices available, unavoidable complications occasionally occur.   ACTIVITY You may resume your regular activity, but move at a slower pace for the next 24 hours.  Take frequent rest periods for the next 24 hours.  Walking will help get rid of the air and reduce the bloated feeling in your belly (abdomen).  No driving for 24 hours (because of the medicine (anesthesia) used during the test).   Do not sign any important legal documents or operate any machinery for 24 hours (because of the anesthesia used during the test).  NUTRITION Drink plenty of fluids.  You may resume your normal diet as instructed by your doctor.  Begin with a light meal and progress to your normal diet. Heavy or fried foods are harder to digest and may make you feel sick to your stomach (nauseated).  Avoid alcoholic beverages for 24 hours or as instructed.  MEDICATIONS You may resume your normal medications unless your doctor tells you otherwise.  WHAT YOU CAN EXPECT TODAY Some feelings of bloating in the abdomen.  Passage of more gas than usual.  Spotting of blood in your stool or on the toilet paper.  IF YOU HAD POLYPS REMOVED DURING THE COLONOSCOPY: No aspirin products for 7 days or as instructed.  No alcohol for 7 days or as instructed.  Eat a soft diet for the next 24 hours.  FINDING OUT THE RESULTS OF YOUR TEST Not all test results are available during your visit. If your test results are not back during the visit, make an appointment with your caregiver to find out the results. Do not assume everything is normal if you have not heard from your  caregiver or the medical facility. It is important for you to follow up on all of your test results.  SEEK IMMEDIATE MEDICAL ATTENTION IF: You have more than a spotting of blood in your stool.  Your belly is swollen (abdominal distention).  You are nauseated or vomiting.  You have a temperature over 101.  You have abdominal pain or discomfort that is severe or gets worse throughout the day.   Your colonoscopy revealed 2 polyp(s) which I removed successfully. Await pathology results, my office will contact you. I recommend repeating colonoscopy in 5 years for surveillance purposes.   You also have diverticulosis and internal hemorrhoids. I would recommend increasing fiber in your diet or adding OTC Benefiber/Metamucil. Be sure to drink at least 4 to 6 glasses of water daily. Follow-up with GI as needed.   I hope you have a great rest of your week!  Lecia Esperanza K. Othman Masur, D.O. Gastroenterology and Hepatology [facility]  

## 2020-03-27 NOTE — Op Note (Signed)
[facility] Patient Name: Ronnie Walker Procedure Date: 03/27/2020 7:15 AM MRN: [id number] Date of Birth: 07-08-1950 Attending MD: Elon Alas. Abbey Chatters DO CSN: [id number] Age: 70 Admit Type: Outpatient Procedure:                Colonoscopy Indications:              Screening for colorectal malignant neoplasm Providers:                Elon Alas. Abbey Chatters, DO, Gwenlyn Fudge, RN, Dereck Leep, Technician, Nelma Rothman, Technician Referring MD:              Medicines:                See the Anesthesia note for documentation of the                            administered medications Complications:            No immediate complications. Estimated Blood Loss:     Estimated blood loss was minimal. Procedure:                Pre-Anesthesia Assessment:                           - The anesthesia plan was to use monitored                            anesthesia care (MAC).                           After obtaining informed consent, the colonoscope                            was passed under direct vision. Throughout the                            procedure, the patient's blood pressure, pulse, and                            oxygen saturations were monitored continuously. The                            [id number]-HQ190L ([id number]) scope was introduced through                            the anus and advanced to the the cecum, identified                            by appendiceal orifice and ileocecal valve. The                            colonoscopy was performed without difficulty. The                            patient  tolerated the procedure well. The quality                            of the bowel preparation was evaluated using the                            BBPS [facility] Bowel Preparation Scale) with scores                            of: Right Colon = 2 (minor amount of residual                            staining, small fragments of stool and/or opaque                             liquid, but mucosa seen well), Transverse Colon = 3                            (entire mucosa seen well with no residual staining,                            small fragments of stool or opaque liquid) and Left                            Colon = 3 (entire mucosa seen well with no residual                            staining, small fragments of stool or opaque                            liquid). The total BBPS score equals 8. The quality                            of the bowel preparation was good. Scope In: 7:42:54 AM Scope Out: 7:59:37 AM Scope Withdrawal Time: 0 hours 15 minutes 34 seconds  Total Procedure Duration: 0 hours 16 minutes 43 seconds  Findings:      The perianal and digital rectal examinations were normal.      Non-bleeding internal hemorrhoids were found during endoscopy.      Multiple small-mouthed diverticula were found in the sigmoid colon and       descending colon.      A 8 mm polyp was found in the cecum. The polyp was sessile. The polyp       was removed with a cold snare. Resection and retrieval were complete.      A 7 mm polyp was found in the transverse colon. The polyp was flat. The       polyp was removed with a cold snare. Resection was complete, but the       polyp tissue was not retrieved. Impression:               - Non-bleeding internal hemorrhoids.                           -  Diverticulosis in the sigmoid colon and in the                            descending colon.                           - One 8 mm polyp in the cecum, removed with a cold                            snare. Resected and retrieved.                           - One 7 mm polyp in the transverse colon, removed                            with a cold snare. Complete resection. Polyp tissue                            not retrieved. Moderate Sedation:      Per Anesthesia Care Recommendation:           - Patient has a contact number available for                            emergencies.  The signs and symptoms of potential                            delayed complications were discussed with the                            patient. Return to normal activities tomorrow.                            Written discharge instructions were provided to the                            patient.                           - Resume previous diet.                           - Continue present medications.                           - Await pathology results.                           - Repeat colonoscopy in 5 years for surveillance.                           - Return to GI clinic PRN. Procedure Code(s):        --- Professional ---                           [phone], Colonoscopy, flexible; with removal of  tumor(s), polyp(s), or other lesion(s) by snare                            technique Diagnosis Code(s):        --- Professional ---                           Z12.11, Encounter for screening for malignant                            neoplasm of colon                           K64.8, Other hemorrhoids                           K63.5, Polyp of colon                           K57.30, Diverticulosis of large intestine without                            perforation or abscess without bleeding CPT copyright 2019 American Medical Association. All rights reserved. The codes documented in this report are preliminary and upon coder review may  be revised to meet current compliance requirements. Elon Alas. Abbey Chatters, DO [place] Abbey Chatters, DO 03/27/2020 8:06:25 AM This report has been signed electronically. Number of Addenda: 0

## 2020-03-27 NOTE — Anesthesia Postprocedure Evaluation (Signed)
Anesthesia Post Note  Patient: Ronnie Walker  Procedure(s) Performed: COLONOSCOPY WITH PROPOFOL (N/A ) POLYPECTOMY INTESTINAL  Patient location during evaluation: PACU Anesthesia Type: General Level of consciousness: awake and alert and oriented Pain management: pain level controlled Vital Signs Assessment: post-procedure vital signs reviewed and stable Respiratory status: spontaneous breathing Cardiovascular status: blood pressure returned to baseline and stable Postop Assessment: no apparent nausea or vomiting Anesthetic complications: no   No complications documented.   Last Vitals:  Vitals:   03/27/20 0639 03/27/20 0802  BP: (!) 146/93 124/85  Pulse: 89 75  Resp: 16 19  Temp: 36.6 C 36.6 C  SpO2: 100% 97%    Last Pain:  Vitals:   03/27/20 0802  TempSrc:   PainSc: Asleep                 Darell Saputo

## 2020-03-27 NOTE — Anesthesia Preprocedure Evaluation (Addendum)
Anesthesia Evaluation  Patient identified by MRN, date of birth, ID band Patient awake    Reviewed: Allergy & Precautions, NPO status , Patient's Chart, lab work & pertinent test results  History of Anesthesia Complications Negative for: history of anesthetic complications  Airway Mallampati: III  TM Distance: >3 FB Neck ROM: Full    Dental  (+) Dental Advisory Given, Teeth Intact   Pulmonary former smoker,    Pulmonary exam normal breath sounds clear to auscultation       Cardiovascular Exercise Tolerance: Good Normal cardiovascular exam Rhythm:Regular Rate:Normal     Neuro/Psych CVA, No Residual Symptoms    GI/Hepatic Neg liver ROS, GERD  Controlled,  Endo/Other  negative endocrine ROS  Renal/GU negative Renal ROS     Musculoskeletal negative musculoskeletal ROS (+)   Abdominal   Peds  Hematology negative hematology ROS (+)   Anesthesia Other Findings   Reproductive/Obstetrics negative OB ROS                          Anesthesia Physical Anesthesia Plan  ASA: III  Anesthesia Plan: General   Post-op Pain Management:    Induction: Intravenous  PONV Risk Score and Plan: TIVA  Airway Management Planned: Nasal Cannula and Natural Airway  Additional Equipment:   Intra-op Plan:   Post-operative Plan:   Informed Consent: I have reviewed the patients History and Physical, chart, labs and discussed the procedure including the risks, benefits and alternatives for the proposed anesthesia with the patient or authorized representative who has indicated his/her understanding and acceptance.     Dental advisory given  Plan Discussed with: CRNA and Surgeon  Anesthesia Plan Comments:         Anesthesia Quick Evaluation

## 2020-03-27 NOTE — Transfer of Care (Signed)
Immediate Anesthesia Transfer of Care Note  Patient: Ronnie Walker  Procedure(s) Performed: COLONOSCOPY WITH PROPOFOL (N/A ) POLYPECTOMY INTESTINAL  Patient Location: PACU  Anesthesia Type:General  Level of Consciousness: awake  Airway & Oxygen Therapy: Patient Spontanous Breathing  Post-op Assessment: Report given to RN  Post vital signs: Reviewed and stable  Last Vitals:  Vitals Value Taken Time  BP 124/85 03/27/20 0802  Temp    Pulse 70 03/27/20 0804  Resp 18 03/27/20 0804  SpO2 97 % 03/27/20 0804  Vitals shown include unvalidated device data.  Last Pain:  Vitals:   03/27/20 0739  TempSrc:   PainSc: 0-No pain      Patients Stated Pain Goal: 5 (49/44/96 7591)  Complications: No complications documented.

## 2020-03-28 LAB — SURGICAL PATHOLOGY

## 2020-03-29 ENCOUNTER — Encounter (HOSPITAL_COMMUNITY): Payer: Self-pay | Admitting: Internal Medicine

## 2020-03-29 NOTE — Progress Notes (Signed)
Dear Mr. Kruzel   The results of your colonoscopy were that tubular adenoma(s)  were removed that was non cancerous and you are to repeat your colonoscopy in 5 years.   Follow-up with GI as needed.  Thank you Floria Raveling, CMA

## 2020-04-02 NOTE — Progress Notes (Signed)
Virtual Visit via Video Note The purpose of this virtual visit is to provide medical care while limiting exposure to the novel coronavirus.    Consent was obtained for video visit:  Yes Answered questions that patient had about telehealth interaction:  Yes I discussed the limitations, risks, security and privacy concerns of performing an evaluation and management service by telemedicine. I also discussed with the patient that there may be a patient responsible charge related to this service. The patient expressed understanding and agreed to proceed.  Pt location: Home Physician Location: office Name of referring provider:  Kathyrn Drown, MD I connected with Ronnie Walker at patients initiation/request on 04/03/2020 at 10:10 AM EST by video enabled telemedicine application and verified that I am speaking with the correct person using two identifiers. Pt MRN:  [id number] Pt DOB:  [birth year] Video Participants:  Ronnie Walker   History of Present Illness:  Ronnie Walker is a 70 year old right-handed male with HLD who follows up for left thalamic stroke.  UPDATE: Current medications:  ASA 81mg , Crestor 40mg   LDL from September was 117.  Atorvastatin was switched to Crestor 40mg  daily.  He is feeling well.  No new neurologic issues.  HISTORY: Patient was admitted to [facility] on 08/23/2019 after presenting with right sided numbness and tingling that began the previous night.  He initially noted paresthesias on the right side of his mouth, followed by paresthesias involving the right hand.  No associated headache, weakness, dizziness, visual disturbance or abnormal gait.  Blood pressure in the ED was 180/87.  EKG showed sinus rhythm.  CT head personally reviewed was negative for acute intracranial abnormality but MRI of brain personally reviewed demonstrated a small acute ischemic infarct within the left thalamus.  He did not receive tPA due to outside window and improving  symptoms.  MRA of head was negative for large vessel occlusion or significant stenosis.  Carotid ultrasound showed no hemodynamically significant stenosis.  Echocardiogram showed EF 60-65% with no cardiac source of embolus (no thrombus or atrial level shunt). LDL was 179.  Hgb A1c was  6.2.  Tingling and numbness resolved over the next couple of days.    08/23/2019 CT HEAD WO:  Negative head CT 08/23/2019 MRI BRAIN WO:  Small focus of acute inschemia within the left thalamus.  No hemorrhage or mass effect. 08/23/2019 MRA HEAD:  Normal intracranial MRA. 08/24/2019 CAROTID US:  1. Small amount of plaque at the right carotid bulb and proximal right internal carotid artery. Estimated degree of stenosis in the right internal carotid artery is less than 50%.  2. Intimal thickening in the left carotid arteries without significant stenosis.  3. Patent vertebral arteries with antegrade flow. 08/24/2019 ECHOCARDIOGRAM:  1. Left ventricular ejection fraction, by estimation, is 60 to 65%. The left ventricle has normal function. The left ventricle has no regional wall motion abnormalities. There is mild left ventricular hypertrophy. Left ventricular diastolic parameters were normal.  2. Right ventricular systolic function is normal. The right ventricular size is normal. Tricuspid regurgitation signal is inadequate for assessing PA pressure.  3. The mitral valve is grossly normal. Trivial mitral valve regurgitation.  4. The aortic valve is tricuspid. Aortic valve regurgitation is not visualized.  5. The inferior vena cava is normal in size with greater than 50% respiratory variability, suggesting right atrial pressure of 3 mmHg.  Past Medical History: Past Medical History:  Diagnosis Date  . CVA (cerebral vascular accident) ([place])  08/2019  . Hyperlipidemia     Medications: Outpatient Encounter Medications as of 04/03/2020  Medication Sig Note  . aspirin EC 81 MG tablet Take 81 mg by mouth daily. Swallow whole.  03/21/2020: On hold  . rosuvastatin (CRESTOR) 40 MG tablet Take 1 tablet (40 mg total) by mouth daily.    No facility-administered encounter medications on file as of 04/03/2020.    Allergies: No Known Allergies  Family History: Family History  Problem Relation Age of Onset  . Cancer Mother        breast  . Diabetes Mother   . Hypertension Father   . Heart disease Father   . COPD Father   . Stroke Father   . Colon cancer Neg Hx     Social History: Social History   Socioeconomic History  . Marital status: Married    Spouse name: Not on file  . Number of children: Not on file  . Years of education: Not on file  . Highest education level: Not on file  Occupational History  . Not on file  Tobacco Use  . Smoking status: Former Research scientist (life sciences)  . Smokeless tobacco: Former Systems developer    Quit date: 02/05/2003  Vaping Use  . Vaping Use: Never used  Substance and Sexual Activity  . Alcohol use: Yes    Comment: 2-3 beer/day  . Drug use: Never  . Sexual activity: Not on file  Other Topics Concern  . Not on file  Social History Narrative  . Not on file   Social Determinants of Health   Financial Resource Strain: Not on file  Food Insecurity: Not on file  Transportation Needs: Not on file  Physical Activity: Not on file  Stress: Not on file  Social Connections: Not on file  Intimate Partner Violence: Not on file    Observations/Objective:   There were no vitals taken for this visit. No acute distress.  Alert and oriented.  Speech fluent and not dysarthric.  Language intact.    Assessment and Plan:   1.  Left thalamic infarct secondary to small vessel disease 2.  Hyperlipidema   1.  Secondary stroke prevention as managed by PCP: -  ASA 81mg  daily -  Statin therapy.  LDL goal less than 70 -  Blood pressure control -  Glycemic control.  Hgb A1c goal less than 7 2.  Mediterranean diet 3.  Routine exercise 4.  Follow up 6 months  Follow Up Instructions:    -I discussed the  assessment and treatment plan with the patient. The patient was provided an opportunity to ask questions and all were answered. The patient agreed with the plan and demonstrated an understanding of the instructions.   The patient was advised to call back or seek an in-person evaluation if the symptoms worsen or if the condition fails to improve as anticipated.    Dudley Major, DO

## 2020-04-03 ENCOUNTER — Telehealth (INDEPENDENT_AMBULATORY_CARE_PROVIDER_SITE_OTHER): Payer: BC Managed Care – PPO | Admitting: Neurology

## 2020-04-03 ENCOUNTER — Encounter: Payer: Self-pay | Admitting: Neurology

## 2020-04-03 DIAGNOSIS — I639 Cerebral infarction, unspecified: Secondary | ICD-10-CM

## 2020-04-03 DIAGNOSIS — E7849 Other hyperlipidemia: Secondary | ICD-10-CM

## 2020-04-03 DIAGNOSIS — I6381 Other cerebral infarction due to occlusion or stenosis of small artery: Secondary | ICD-10-CM

## 2020-04-13 ENCOUNTER — Other Ambulatory Visit: Payer: Self-pay

## 2020-04-13 ENCOUNTER — Ambulatory Visit (INDEPENDENT_AMBULATORY_CARE_PROVIDER_SITE_OTHER): Payer: BC Managed Care – PPO | Admitting: Urology

## 2020-04-13 ENCOUNTER — Encounter: Payer: Self-pay | Admitting: Urology

## 2020-04-13 VITALS — BP 158/83 | HR 59 | Temp 97.6°F | Ht 65.0 in | Wt 155.0 lb

## 2020-04-13 DIAGNOSIS — N433 Hydrocele, unspecified: Secondary | ICD-10-CM | POA: Diagnosis not present

## 2020-04-13 LAB — URINALYSIS, ROUTINE W REFLEX MICROSCOPIC
Bilirubin, UA: NEGATIVE
Glucose, UA: NEGATIVE
Ketones, UA: NEGATIVE
Leukocytes,UA: NEGATIVE
Nitrite, UA: NEGATIVE
Protein,UA: NEGATIVE
RBC, UA: NEGATIVE
Specific Gravity, UA: 1.01 (ref 1.005–1.030)
Urobilinogen, Ur: 0.2 mg/dL (ref 0.2–1.0)
pH, UA: 6 (ref 5.0–7.5)

## 2020-04-13 NOTE — Patient Instructions (Signed)
Hydrocelectomy, Adult  A hydrocelectomy is a surgical procedure to remove a collection of fluid (hydrocele) from the scrotum, which is the pouch that holds the testicles. You may need to have this procedure if a hydrocele is causing painful swelling in your scrotum. Tell a health care provider about:  Any allergies you have.  All medicines you are taking, including vitamins, herbs, eye drops, creams, and over-the-counter medicines.  Any problems you or family members have had with anesthetic medicines.  Any blood disorders you have.  Any surgeries you have had.  Any medical conditions you have. What are the risks? Generally, this is a safe procedure. However, problems may occur, including:  Bleeding into the scrotum (scrotal hematoma).  Damage to nearby structures or organs, including to the testicle or the tube that carries sperm out of the testicle (vas deferens).  Infection.  Allergic reactions to medicines. What happens before the procedure? Staying hydrated Follow instructions from your health care provider about hydration, which may include:  Up to 2 hours before the procedure - you may continue to drink clear liquids, such as water, clear fruit juice, black coffee, and plain tea. Eating and drinking restrictions Follow instructions from your health care provider about eating and drinking, which may include:  8 hours before the procedure - stop eating heavy meals or foods, such as meat, fried foods, or fatty foods.  6 hours before the procedure - stop eating light meals or foods, such as toast or cereal.  6 hours before the procedure - stop drinking milk or drinks that contain milk.  2 hours before the procedure - stop drinking clear liquids. Medicines Ask your health care provider about:  Changing or stopping your regular medicines. This is especially important if you are taking diabetes medicines or blood thinners.  Taking medicines such as aspirin and ibuprofen.  These medicines can thin your blood. Do not take these medicines unless your health care provider tells you to take them.  Taking over-the-counter medicines, vitamins, herbs, and supplements. General instructions  Do not use any products that contain nicotine or tobacco for at least 4 weeks before the procedure. These products include cigarettes, e-cigarettes, and chewing tobacco. If you need help quitting, ask your health care provider.  Plan to have someone take you home from the hospital or clinic.  Plan to have a responsible adult care for you for at least 24 hours after you leave the hospital or clinic. This is important.  Ask your health care provider: ? How your surgery site will be marked. ? What steps will be taken to help prevent infection. These may include:  Removing hair at the surgery site.  Washing skin with a germ-killing soap.  Taking antibiotic medicine. What happens during the procedure?  An IV will be inserted into one of your veins.  You will be given one or more of the following: ? A medicine to make you relax (sedative). ? A medicine to make you fall asleep (general anesthetic).  A small incision will be made through the skin of your scrotum.  Your testicle and the hydrocele will be located, and the hydrocele sac will be opened with an incision.  The fluid will be drained from the hydrocele. Part of the hydrocele sac may be removed.  The hydrocele will be closed with stitches that dissolve (absorbable sutures). This prevents fluid from building up again.  If your hydrocele is large, you may have a thin, rubber drain placed to allow fluid to drain   after the procedure.  The incision in your scrotum will be closed with absorbable sutures, skin glue, or adhesives.  A bandage (dressing) will be placed over the incision. The dressing may be held in place with an athletic support strap (scrotal support). The procedure may vary among health care providers and  hospitals. What happens after the procedure?  Your blood pressure, heart rate, breathing rate, and blood oxygen level will be monitored until you leave the hospital or clinic.  You will be given pain medicine as needed.  Your IV will be removed, and your insertion site will be checked for bleeding.  Do not drive for 24 hours if you were given a sedative during your procedure.  You may need to wear a scrotal support. This holds the dressing in place and supports your scrotum.   Summary  A hydrocelectomy is a surgical procedure to remove a collection of fluid (hydrocele) from the scrotum, which is the pouch that holds the testicles. You may need to have this procedure if a hydrocele is causing painful swelling in your scrotum.  During the procedure, the hydrocele will be drained and then closed with stitches that dissolve (absorbable sutures). This prevents fluid from building up again.  If your hydrocele is large, you may have a thin, rubber drain placed to allow fluid to drain after the procedure.  You may need to wear a scrotal support after your procedure. This holds the dressing in place and supports your scrotum. This information is not intended to replace advice given to you by your health care provider. Make sure you discuss any questions you have with your health care provider. Document Revised: 07/27/2018 Document Reviewed: 07/27/2018 Elsevier Patient Education  2021 Elsevier Inc.  

## 2020-04-13 NOTE — Progress Notes (Signed)

## 2020-04-13 NOTE — Progress Notes (Signed)
04/13/2020 10:23 AM   Darral Dash 25-Apr-1950 [id number]  Referring provider: Kathyrn Drown, MD [place],  [place] [address]  Right scrotal swelling  HPI: Mr Fallert is a 70yo here for evaluation of a right hydrocele. Over 5 years he has noted worsening right scrotal swelling. He has pain with walking and sitting. No issues urinating. He has a hx of right inguinal hernia repair in 2009. CT from 02/17/2020 showed a large right hydrocele and no inguinal hernia.    PMH: Past Medical History:  Diagnosis Date  . CVA (cerebral vascular accident) ([place])    08/2019  . Hyperlipidemia     Surgical History: Past Surgical History:  Procedure Laterality Date  . BACK SURGERY    . COLONOSCOPY WITH PROPOFOL N/A 03/27/2020   Procedure: COLONOSCOPY WITH PROPOFOL;  Surgeon: Eloise Harman, DO;  Location: [facility];  Service: Endoscopy;  Laterality: N/A;  12:45pm  . INGUINAL HERNIA REPAIR Right   . POLYPECTOMY  03/27/2020   Procedure: POLYPECTOMY INTESTINAL;  Surgeon: Eloise Harman, DO;  Location: [facility];  Service: Endoscopy;;  . VASECTOMY      Home Medications:  Allergies as of 04/13/2020   No Known Allergies     Medication List       Accurate as of April 13, 2020 10:23 AM. If you have any questions, ask your nurse or doctor.        aspirin EC 81 MG tablet Take 81 mg by mouth daily. Swallow whole.   rosuvastatin 40 MG tablet Commonly known as: Crestor Take 1 tablet (40 mg total) by mouth daily.       Allergies: No Known Allergies  Family History: Family History  Problem Relation Age of Onset  . Cancer Mother        breast  . Diabetes Mother   . Hypertension Father   . Heart disease Father   . COPD Father   . Stroke Father   . Colon cancer Neg Hx     Social History:  reports that he has quit smoking. He quit smokeless tobacco use about 17 years ago. He reports current alcohol use. He reports that he does not use  drugs.  ROS: All other review of systems were reviewed and are negative except what is noted above in HPI  Physical Exam: BP (!) 158/83   Pulse (!) 59   Temp 97.6 F (36.4 C)   Ht 5\' 5"  (1.651 m)   Wt 155 lb (70.3 kg)   BMI 25.79 kg/m   Constitutional:  Alert and oriented, No acute distress. HEENT: [place] AT, moist mucus membranes.  Trachea midline, no masses. Cardiovascular: No clubbing, cyanosis, or edema. Respiratory: Normal respiratory effort, no increased work of breathing. GI: Abdomen is soft, nontender, nondistended, no abdominal masses GU: No CVA tenderness. Circumcised phallus. No masses/lesions on penis, testis, scrotum. 7cm right hydrocele  Lymph: No cervical or inguinal lymphadenopathy. Skin: No rashes, bruises or suspicious lesions. Neurologic: Grossly intact, no focal deficits, moving all 4 extremities. Psychiatric: Normal mood and affect.  Laboratory Data: Lab Results  Component Value Date   WBC 5.9 08/24/2019   HGB 14.3 08/24/2019   HCT 42.2 08/24/2019   MCV 91.7 08/24/2019   PLT 195 08/24/2019    Lab Results  Component Value Date   CREATININE 0.84 11/30/2019    Lab Results  Component Value Date   PSA 0.44 01/24/2013    No results found for: TESTOSTERONE  Lab Results  Component Value Date   HGBA1C 6.2 (H) 08/23/2019    Urinalysis    Component Value Date/Time   COLORURINE STRAW (A) 08/23/2019 1906   APPEARANCEUR CLEAR 08/23/2019 1906   LABSPEC 1.008 08/23/2019 1906   PHURINE 6.0 08/23/2019 1906   GLUCOSEU NEGATIVE 08/23/2019 1906   HGBUR NEGATIVE 08/23/2019 1906   BILIRUBINUR NEGATIVE 08/23/2019 1906   KETONESUR NEGATIVE 08/23/2019 1906   PROTEINUR NEGATIVE 08/23/2019 1906   NITRITE NEGATIVE 08/23/2019 1906   LEUKOCYTESUR NEGATIVE 08/23/2019 1906    No results found for: LABMICR, [place], RBCUA, LABEPIT, MUCUS, BACTERIA  Pertinent Imaging: CT 02/17/2020: Images reviewed and discussed with the patient  No results found for this or any  previous visit.  No results found for this or any previous visit.  No results found for this or any previous visit.  No results found for this or any previous visit.  No results found for this or any previous visit.  No results found for this or any previous visit.  No results found for this or any previous visit.  No results found for this or any previous visit.   Assessment & Plan:    1. Hydrocele, unspecified hydrocele type We discussed the management of hydroceles including observation, aspiration, and hydrocelectomy. After discussing the options the patient elects for hydrocelectomy. Risks/benefits/alternatives discussed - Urinalysis, Routine w reflex microscopic   No follow-ups on file.  Nicolette Bang, MD  [facility] [place]

## 2020-04-18 ENCOUNTER — Telehealth: Payer: Self-pay | Admitting: Urology

## 2020-04-18 NOTE — Telephone Encounter (Signed)
Patient called and LM  That he wants to reschedule his surgery from the 10th of March.

## 2020-04-20 NOTE — Telephone Encounter (Signed)
Pt will keep scheduled surgery date

## 2020-04-20 NOTE — Telephone Encounter (Signed)
Patient called back and left a voice mail message that he has re-arranged his schedule and he is able to KEEP his surgery on 05/24/20.

## 2020-05-16 ENCOUNTER — Telehealth: Payer: Self-pay

## 2020-05-16 NOTE — Telephone Encounter (Signed)
Called pt to at first tell him that we had FMLA paperwork but no short term disability papers. He stated when he left them and to whom he gave them to. AL,RN looked back and found them. Then he wanted them filled out before his surgery. I explained there are questions on paperwork asking if surgery was performed and when, so it has to be after his surgery. Pt expressed understanding.

## 2020-05-21 ENCOUNTER — Telehealth: Payer: Self-pay

## 2020-05-21 NOTE — Telephone Encounter (Signed)
Patient calling to find out return to work dates.  His employment is asking.  Please call patient back at [phone].  Thanks, Helene Kelp

## 2020-05-21 NOTE — Patient Instructions (Signed)
Ronnie Walker  05/21/2020     @PREFPERIOPPHARMACY @   Your procedure is scheduled on  05/24/2020.   Report to [facility] at  1300 (1:00)  P.M.   Call this number if you have problems the morning of surgery:  [phone]   Remember:  Do not eat or drink after midnight.                        Take these medicines the morning of surgery with A SIP OF WATER None.  Place clean sheets on your bed the night before your surgery and DO NOT sleep  Shower with CHG the night before and the morning of your procedure. DO NOT put CHG on your face, hair or genitals.  After each shower, dry off with a clean towel put on clean, comfortable clothes and brush your teeth.      Do not wear jewelry, make-up or nail polish.  Do not wear lotions, powders, or perfumes, or deodorant.  Do not shave 48 hours prior to surgery.  Men may shave face and neck.  Do not bring valuables to the hospital.  [facility] is not responsible for any belongings or valuables.  Contacts, dentures or bridgework may not be worn into surgery.  Leave your suitcase in the car.  After surgery it may be brought to your room.  For patients admitted to the hospital, discharge time will be determined by your treatment team.  Patients discharged the day of surgery will not be allowed to drive home and must have someone with them for 24 hours.   Special instructions:   DO NOT smoke tobacco or vape the morning of your procedure.   Please read over the following fact sheets that you were given. Coughing and Deep Breathing, Surgical Site Infection Prevention, Anesthesia Post-op Instructions and Care and Recovery After Surgery       Current Urology, 10(1), 1-14. [email or web address]">  Hydrocelectomy, Adult, Care After This sheet gives you information about how to care for yourself after your procedure. Your health care provider may also give you more specific instructions. If you have problems or  questions, contact your health care provider. What can I expect after the procedure? After your procedure, it is common to have:  Mild discomfort and swelling in the pouch that holds your testicles (scrotum).  Bruising of the scrotum. Follow these instructions at home: Medicines  Take over-the-counter and prescription medicines only as told by your health care provider.  Ask your health care provider if the medicine prescribed to you: ? Requires you to avoid driving or using heavy machinery. ? Can cause constipation. You may need to take these actions to prevent or treat constipation:  Drink enough fluid to keep your urine pale yellow.  Take over-the-counter or prescription medicines.  Eat foods that are high in fiber, such as beans, whole grains, and fresh fruits and vegetables.  Limit foods that are high in fat and processed sugars, such as fried or sweet foods. Bathing  Do not take baths, swim, or use a hot tub until your health care provider approves. Ask your health care provider if you may take showers. You may only be allowed to take sponge baths.  If you were told to wear an athletic support strap (scrotal support), keep it dry. Take it off when you shower or bathe. Incision care  Follow instructions from your health care provider about how to take care  of your incision. Make sure you: ? Wash your hands with soap and water before and after you change your bandage (dressing). If soap and water are not available, use hand sanitizer. ? Change your dressing as told by your health care provider. ? Leave stitches (sutures), skin glue, or adhesive strips in place. These skin closures may need to stay in place for 2 weeks or longer. If adhesive strip edges start to loosen and curl up, you may trim the loose edges. Do not remove adhesive strips completely unless your health care provider tells you to do that.  Check your incision and scrotum every day for signs of infection. Check  for: ? More redness, swelling, or pain. ? Fluid or blood. ? Warmth. ? Pus or a bad smell.   Managing pain and swelling If directed, put ice on the affected area. To do this:  Put ice in a plastic bag.  Place a towel between your skin and the bag.  Leave the ice on for 20 minutes, 2-3 times per day.   Activity  Do not do any high-energy activities for as long as told by your health care provider.  Do not lift anything that is heavier than 10 lb (4.5 kg), or the limit that you are told, until your health care provider says that it is safe.  Return to your normal activities as told by your health care provider. Ask your health care provider what activities are safe for you.  Do not drive for 24 hours if you were given a sedative during your procedure.  Ask your health care provider when it is safe to drive. General instructions  Do not use any products that contain nicotine or tobacco, such as cigarettes, e-cigarettes, and chewing tobacco. These can delay incision healing after surgery. If you need help quitting, ask your health care provider.  If you were given a scrotal support, wear it as told by your health care provider.  If you had a drain put in during the procedure, you will need to have it removed at a follow-up visit.  Keep all follow-up visits as told by your health care provider. This is important. Contact a health care provider if:  Your pain is not controlled with medicine.  You have more redness, swelling, or pain around your scrotum.  You have fluid or blood coming from your incision.  Your incision feels warm to the touch.  You have pus or a bad smell coming from your scrotum.  You have a fever. Get help right away if:  You develop shaking, chills, and a fever that is higher than 101.32F (38.8C).  You have redness or swelling that starts at your scrotum and spreads outward to cover your whole groin.  You develop swelling of the legs or difficulty  breathing. Summary  After a hydrocelectomy, it is common to have mild discomfort, swelling, and bruising.  Do not take baths, swim, or use a hot tub until your health care provider approves. Ask your health care provider if you may take showers.  If directed, put ice on the affected area to help with pain and swelling.  Do not do any high-energy activities or lift anything heavier than 10 lb (4.5 kg) for as long as told by your health care provider.  If you were given a scrotal support, keep it dry. Wear the scrotal support as told by your health care provider. This information is not intended to replace advice given to you by your health  care provider. Make sure you discuss any questions you have with your health care provider. Document Revised: 07/27/2018 Document Reviewed: 07/27/2018 Elsevier Patient Education  2021 [place] Anesthesia, Adult, Care After This sheet gives you information about how to care for yourself after your procedure. Your health care provider may also give you more specific instructions. If you have problems or questions, contact your health care provider. What can I expect after the procedure? After the procedure, the following side effects are common:  Pain or discomfort at the IV site.  Nausea.  Vomiting.  Sore throat.  Trouble concentrating.  Feeling cold or chills.  Feeling weak or tired.  Sleepiness and fatigue.  Soreness and body aches. These side effects can affect parts of the body that were not involved in surgery. Follow these instructions at home: For the time period you were told by your health care provider:  Rest.  Do not participate in activities where you could fall or become injured.  Do not drive or use machinery.  Do not drink alcohol.  Do not take sleeping pills or medicines that cause drowsiness.  Do not make important decisions or sign legal documents.  Do not take care of children on your own.   Eating  and drinking  Follow any instructions from your health care provider about eating or drinking restrictions.  When you feel hungry, start by eating small amounts of foods that are soft and easy to digest (bland), such as toast. Gradually return to your regular diet.  Drink enough fluid to keep your urine pale yellow.  If you vomit, rehydrate by drinking water, juice, or clear broth. General instructions  If you have sleep apnea, surgery and certain medicines can increase your risk for breathing problems. Follow instructions from your health care provider about wearing your sleep device: ? Anytime you are sleeping, including during daytime naps. ? While taking prescription pain medicines, sleeping medicines, or medicines that make you drowsy.  Have a responsible adult stay with you for the time you are told. It is important to have someone help care for you until you are awake and alert.  Return to your normal activities as told by your health care provider. Ask your health care provider what activities are safe for you.  Take over-the-counter and prescription medicines only as told by your health care provider.  If you smoke, do not smoke without supervision.  Keep all follow-up visits as told by your health care provider. This is important. Contact a health care provider if:  You have nausea or vomiting that does not get better with medicine.  You cannot eat or drink without vomiting.  You have pain that does not get better with medicine.  You are unable to pass urine.  You develop a skin rash.  You have a fever.  You have redness around your IV site that gets worse. Get help right away if:  You have difficulty breathing.  You have chest pain.  You have blood in your urine or stool, or you vomit blood. Summary  After the procedure, it is common to have a sore throat or nausea. It is also common to feel tired.  Have a responsible adult stay with you for the time you  are told. It is important to have someone help care for you until you are awake and alert.  When you feel hungry, start by eating small amounts of foods that are soft and easy to digest (bland), such as toast.  Gradually return to your regular diet.  Drink enough fluid to keep your urine pale yellow.  Return to your normal activities as told by your health care provider. Ask your health care provider what activities are safe for you. This information is not intended to replace advice given to you by your health care provider. Make sure you discuss any questions you have with your health care provider. Document Revised: 11/17/2019 Document Reviewed: 06/16/2019 Elsevier Patient Education  2021 [employer].

## 2020-05-21 NOTE — Telephone Encounter (Signed)
Left message for pt that we were returning his call and he could call back.

## 2020-05-22 ENCOUNTER — Other Ambulatory Visit (HOSPITAL_COMMUNITY)
Admission: RE | Admit: 2020-05-22 | Discharge: 2020-05-22 | Disposition: A | Discharge status: Home / Self Care | Payer: BC Managed Care – PPO | Source: Ambulatory Visit | Attending: Urology | Admitting: Urology

## 2020-05-22 ENCOUNTER — Encounter (HOSPITAL_COMMUNITY)
Admission: RE | Admit: 2020-05-22 | Discharge: 2020-05-22 | Disposition: A | Discharge status: Home / Self Care | Payer: BC Managed Care – PPO | Source: Ambulatory Visit | Attending: Urology | Admitting: Urology

## 2020-05-22 ENCOUNTER — Other Ambulatory Visit: Payer: Self-pay

## 2020-05-22 DIAGNOSIS — Z803 Family history of malignant neoplasm of breast: Secondary | ICD-10-CM | POA: Diagnosis not present

## 2020-05-22 DIAGNOSIS — N433 Hydrocele, unspecified: Secondary | ICD-10-CM | POA: Diagnosis not present

## 2020-05-22 DIAGNOSIS — Z20822 Contact with and (suspected) exposure to covid-19: Secondary | ICD-10-CM | POA: Insufficient documentation

## 2020-05-22 DIAGNOSIS — Z8673 Personal history of transient ischemic attack (TIA), and cerebral infarction without residual deficits: Secondary | ICD-10-CM | POA: Diagnosis not present

## 2020-05-22 DIAGNOSIS — Z7982 Long term (current) use of aspirin: Secondary | ICD-10-CM | POA: Diagnosis not present

## 2020-05-22 DIAGNOSIS — Z87891 Personal history of nicotine dependence: Secondary | ICD-10-CM | POA: Diagnosis not present

## 2020-05-22 DIAGNOSIS — Z79899 Other long term (current) drug therapy: Secondary | ICD-10-CM | POA: Diagnosis not present

## 2020-05-22 DIAGNOSIS — Z01812 Encounter for preprocedural laboratory examination: Secondary | ICD-10-CM | POA: Insufficient documentation

## 2020-05-22 DIAGNOSIS — Z833 Family history of diabetes mellitus: Secondary | ICD-10-CM | POA: Diagnosis not present

## 2020-05-22 DIAGNOSIS — Z8249 Family history of ischemic heart disease and other diseases of the circulatory system: Secondary | ICD-10-CM | POA: Diagnosis not present

## 2020-05-22 DIAGNOSIS — Z825 Family history of asthma and other chronic lower respiratory diseases: Secondary | ICD-10-CM | POA: Diagnosis not present

## 2020-05-22 DIAGNOSIS — Z823 Family history of stroke: Secondary | ICD-10-CM | POA: Diagnosis not present

## 2020-05-22 LAB — SARS CORONAVIRUS 2 (TAT 6-24 HRS): SARS Coronavirus 2: NEGATIVE

## 2020-05-22 NOTE — Telephone Encounter (Signed)
Patient called the office, returning our call.  Please call him at [phone].

## 2020-05-23 NOTE — Telephone Encounter (Signed)
Returned pts call. He was confused about when he could possibly return to work. Pt was notified per Dr. Alyson Ingles it could be 6 weeks from date of his surgery. Surgery date is 05/24/20.

## 2020-05-24 ENCOUNTER — Ambulatory Visit (HOSPITAL_COMMUNITY): Payer: BC Managed Care – PPO | Admitting: Anesthesiology

## 2020-05-24 ENCOUNTER — Ambulatory Visit (HOSPITAL_COMMUNITY)
Admission: RE | Admit: 2020-05-24 | Discharge: 2020-05-24 | Disposition: A | Discharge status: Home / Self Care | Payer: BC Managed Care – PPO | Attending: Urology | Admitting: Urology

## 2020-05-24 ENCOUNTER — Encounter (HOSPITAL_COMMUNITY)
Admission: RE | Disposition: A | Discharge status: Home / Self Care | Payer: Self-pay | Source: Home / Self Care | Attending: Urology

## 2020-05-24 ENCOUNTER — Encounter (HOSPITAL_COMMUNITY): Payer: Self-pay | Admitting: Urology

## 2020-05-24 DIAGNOSIS — Z79899 Other long term (current) drug therapy: Secondary | ICD-10-CM | POA: Diagnosis not present

## 2020-05-24 DIAGNOSIS — Z833 Family history of diabetes mellitus: Secondary | ICD-10-CM | POA: Insufficient documentation

## 2020-05-24 DIAGNOSIS — Z8673 Personal history of transient ischemic attack (TIA), and cerebral infarction without residual deficits: Secondary | ICD-10-CM | POA: Diagnosis not present

## 2020-05-24 DIAGNOSIS — Z87891 Personal history of nicotine dependence: Secondary | ICD-10-CM | POA: Diagnosis not present

## 2020-05-24 DIAGNOSIS — Z7982 Long term (current) use of aspirin: Secondary | ICD-10-CM | POA: Diagnosis not present

## 2020-05-24 DIAGNOSIS — Z20822 Contact with and (suspected) exposure to covid-19: Secondary | ICD-10-CM | POA: Insufficient documentation

## 2020-05-24 DIAGNOSIS — N433 Hydrocele, unspecified: Secondary | ICD-10-CM | POA: Insufficient documentation

## 2020-05-24 DIAGNOSIS — Z8249 Family history of ischemic heart disease and other diseases of the circulatory system: Secondary | ICD-10-CM | POA: Insufficient documentation

## 2020-05-24 DIAGNOSIS — Z823 Family history of stroke: Secondary | ICD-10-CM | POA: Insufficient documentation

## 2020-05-24 DIAGNOSIS — K219 Gastro-esophageal reflux disease without esophagitis: Secondary | ICD-10-CM | POA: Diagnosis not present

## 2020-05-24 DIAGNOSIS — Z803 Family history of malignant neoplasm of breast: Secondary | ICD-10-CM | POA: Diagnosis not present

## 2020-05-24 DIAGNOSIS — Z825 Family history of asthma and other chronic lower respiratory diseases: Secondary | ICD-10-CM | POA: Insufficient documentation

## 2020-05-24 HISTORY — PX: HYDROCELE EXCISION: SHX482

## 2020-05-24 SURGERY — HYDROCELECTOMY
Anesthesia: General | Site: Scrotum | Laterality: Right

## 2020-05-24 MED ORDER — HYDROMORPHONE HCL 1 MG/ML IJ SOLN: 0.2500 mg | INTRAMUSCULAR | Status: DC | PRN

## 2020-05-24 MED ORDER — CEFAZOLIN SODIUM-DEXTROSE 2-4 GM/100ML-% IV SOLN
2.0000 g | INTRAVENOUS | Status: AC
  Administered 2020-05-24: 2 g via INTRAVENOUS
  Filled 2020-05-24: qty 100

## 2020-05-24 MED ORDER — 0.9 % SODIUM CHLORIDE (POUR BTL) OPTIME
TOPICAL | Status: DC | PRN
  Administered 2020-05-24: 1000 mL

## 2020-05-24 MED ORDER — MIDAZOLAM HCL 2 MG/2ML IJ SOLN
INTRAMUSCULAR | Status: DC | PRN
  Administered 2020-05-24: 2 mg via INTRAVENOUS

## 2020-05-24 MED ORDER — CHLORHEXIDINE GLUCONATE 0.12 % MT SOLN
15.0000 mL | Freq: Once | OROMUCOSAL | Status: AC
  Administered 2020-05-24: 15 mL via OROMUCOSAL
  Filled 2020-05-24: qty 15

## 2020-05-24 MED ORDER — BUPIVACAINE HCL (PF) 0.25 % IJ SOLN
INTRAMUSCULAR | Status: DC | PRN
  Administered 2020-05-24: 10 mL

## 2020-05-24 MED ORDER — MIDAZOLAM HCL 2 MG/2ML IJ SOLN
INTRAMUSCULAR | Status: AC
  Filled 2020-05-24: qty 2

## 2020-05-24 MED ORDER — EPHEDRINE 5 MG/ML INJ
INTRAVENOUS | Status: AC
  Filled 2020-05-24: qty 10

## 2020-05-24 MED ORDER — PROPOFOL 10 MG/ML IV BOLUS
INTRAVENOUS | Status: DC | PRN
  Administered 2020-05-24: 50 mg via INTRAVENOUS
  Administered 2020-05-24: 150 mg via INTRAVENOUS

## 2020-05-24 MED ORDER — LIDOCAINE HCL (CARDIAC) PF 100 MG/5ML IV SOSY
PREFILLED_SYRINGE | INTRAVENOUS | Status: DC | PRN
  Administered 2020-05-24: 60 mg via INTRAVENOUS

## 2020-05-24 MED ORDER — OXYCODONE-ACETAMINOPHEN 5-325 MG PO TABS: 1.0000 | ORAL_TABLET | ORAL | 0 refills | Status: DC | PRN

## 2020-05-24 MED ORDER — ORAL CARE MOUTH RINSE: 15.0000 mL | Freq: Once | OROMUCOSAL | Status: AC

## 2020-05-24 MED ORDER — EPHEDRINE SULFATE 50 MG/ML IJ SOLN
INTRAMUSCULAR | Status: DC | PRN
  Administered 2020-05-24: 10 mg via INTRAVENOUS

## 2020-05-24 MED ORDER — DEXAMETHASONE SODIUM PHOSPHATE 10 MG/ML IJ SOLN
INTRAMUSCULAR | Status: DC | PRN
  Administered 2020-05-24: 5 mg via INTRAVENOUS

## 2020-05-24 MED ORDER — ONDANSETRON HCL 4 MG/2ML IJ SOLN
INTRAMUSCULAR | Status: DC | PRN
  Administered 2020-05-24: 4 mg via INTRAVENOUS

## 2020-05-24 MED ORDER — ONDANSETRON HCL 4 MG/2ML IJ SOLN: 4.0000 mg | Freq: Once | INTRAMUSCULAR | Status: DC | PRN

## 2020-05-24 MED ORDER — LACTATED RINGERS IV SOLN
INTRAVENOUS | Status: DC
  Administered 2020-05-24: 1000 mL via INTRAVENOUS

## 2020-05-24 MED ORDER — FENTANYL CITRATE (PF) 100 MCG/2ML IJ SOLN
INTRAMUSCULAR | Status: AC
  Filled 2020-05-24: qty 2

## 2020-05-24 MED ORDER — FENTANYL CITRATE (PF) 250 MCG/5ML IJ SOLN
INTRAMUSCULAR | Status: DC | PRN
  Administered 2020-05-24: 50 ug via INTRAVENOUS
  Administered 2020-05-24: 25 ug via INTRAVENOUS
  Administered 2020-05-24: 50 ug via INTRAVENOUS
  Administered 2020-05-24: 25 ug via INTRAVENOUS

## 2020-05-24 MED ORDER — BUPIVACAINE HCL (PF) 0.25 % IJ SOLN
INTRAMUSCULAR | Status: AC
  Filled 2020-05-24: qty 30

## 2020-05-24 SURGICAL SUPPLY — 33 items
ADH SKN CLS APL DERMABOND .7 (GAUZE/BANDAGES/DRESSINGS) ×1
BLADE SURG 15 STRL LF DISP TIS (BLADE) ×1 IMPLANT
BLADE SURG 15 STRL SS (BLADE) ×2
COVER LIGHT HANDLE STERIS (MISCELLANEOUS) ×4 IMPLANT
COVER WAND RF STERILE (DRAPES) ×2 IMPLANT
DECANTER SPIKE VIAL GLASS SM (MISCELLANEOUS) ×2 IMPLANT
DERMABOND ADVANCED (GAUZE/BANDAGES/DRESSINGS) ×1
DERMABOND ADVANCED .7 DNX12 (GAUZE/BANDAGES/DRESSINGS) ×1 IMPLANT
ELECT REM PT RETURN 9FT ADLT (ELECTROSURGICAL) ×2
ELECTRODE REM PT RTRN 9FT ADLT (ELECTROSURGICAL) ×1 IMPLANT
GAUZE SPONGE 4X4 12PLY STRL (GAUZE/BANDAGES/DRESSINGS) ×3 IMPLANT
GLOVE BIO SURGEON STRL SZ8 (GLOVE) ×2 IMPLANT
GLOVE SRG 8 PF TXTR STRL LF DI (GLOVE) ×1 IMPLANT
GLOVE SURG ENC TEXT LTX SZ7 (GLOVE) ×1 IMPLANT
GLOVE SURG UNDER POLY LF SZ7 (GLOVE) ×4 IMPLANT
GLOVE SURG UNDER POLY LF SZ8 (GLOVE) ×2
GOWN STRL REUS W/TWL LRG LVL3 (GOWN DISPOSABLE) ×2 IMPLANT
GOWN STRL REUS W/TWL XL LVL3 (GOWN DISPOSABLE) ×2 IMPLANT
KIT TURNOVER KIT A (KITS) ×2 IMPLANT
MANIFOLD NEPTUNE II (INSTRUMENTS) ×2 IMPLANT
NDL HYPO 25X1 1.5 SAFETY (NEEDLE) ×1 IMPLANT
NEEDLE HYPO 25X1 1.5 SAFETY (NEEDLE) ×2 IMPLANT
NS IRRIG 1000ML POUR BTL (IV SOLUTION) ×2 IMPLANT
PACK MINOR (CUSTOM PROCEDURE TRAY) ×2 IMPLANT
PAD ARMBOARD 7.5X6 YLW CONV (MISCELLANEOUS) ×2 IMPLANT
PENCIL SMOKE EVACUATOR (MISCELLANEOUS) ×2 IMPLANT
SET BASIN LINEN APH (SET/KITS/TRAYS/PACK) ×2 IMPLANT
SUPPORT SCROTAL LG STRP (MISCELLANEOUS) ×2 IMPLANT
SUT MNCRL AB 4-0 PS2 18 (SUTURE) ×2 IMPLANT
SUT VIC AB 3-0 SH 27 (SUTURE) ×2
SUT VIC AB 3-0 SH 27X BRD (SUTURE) ×1 IMPLANT
SYR CONTROL 10ML LL (SYRINGE) ×2 IMPLANT
YANKAUER SUCT 12FT TUBE ARGYLE (SUCTIONS) ×2 IMPLANT

## 2020-05-24 NOTE — H&P (Signed)
HPI: [name] is a 70yo here for evaluation of a right hydrocele. Over 5 years he has noted worsening right scrotal swelling. He has pain with walking and sitting. No issues urinating. He has a hx of right inguinal hernia repair in 2009. CT from 02/17/2020 showed a large right hydrocele and no inguinal hernia.    PMH:     Past Medical History:  Diagnosis Date  . CVA (cerebral vascular accident) ([place])    08/2019  . Hyperlipidemia     Surgical History:      Past Surgical History:  Procedure Laterality Date  . BACK SURGERY    . COLONOSCOPY WITH PROPOFOL N/A 03/27/2020   Procedure: COLONOSCOPY WITH PROPOFOL;  Surgeon: Eloise Harman, DO;  Location: [facility];  Service: Endoscopy;  Laterality: N/A;  12:45pm  . INGUINAL HERNIA REPAIR Right   . POLYPECTOMY  03/27/2020   Procedure: POLYPECTOMY INTESTINAL;  Surgeon: Eloise Harman, DO;  Location: [facility];  Service: Endoscopy;;  . VASECTOMY      Home Medications:  Allergies as of 04/13/2020   No Known Allergies        Medication List       Accurate as of April 13, 2020 10:23 AM. If you have any questions, ask your nurse or doctor.        aspirin EC 81 MG tablet Take 81 mg by mouth daily. Swallow whole.   rosuvastatin 40 MG tablet Commonly known as: Crestor Take 1 tablet (40 mg total) by mouth daily.       Allergies: No Known Allergies  Family History:      Family History  Problem Relation Age of Onset  . Cancer Mother        breast  . Diabetes Mother   . Hypertension Father   . Heart disease Father   . COPD Father   . Stroke Father   . Colon cancer Neg Hx     Social History:  reports that he has quit smoking. He quit smokeless tobacco use about 17 years ago. He reports current alcohol use. He reports that he does not use drugs.  ROS: All other review of systems were reviewed and are negative except what is noted above in HPI  Physical Exam: BP (!)  158/83   Pulse (!) 59   Temp 97.6 F (36.4 C)   Ht 5\' 5"  (1.651 m)   Wt 155 lb (70.3 kg)   BMI 25.79 kg/m   Constitutional:  Alert and oriented, No acute distress. HEENT: [place] AT, moist mucus membranes.  Trachea midline, no masses. Cardiovascular: No clubbing, cyanosis, or edema. Respiratory: Normal respiratory effort, no increased work of breathing. GI: Abdomen is soft, nontender, nondistended, no abdominal masses GU: No CVA tenderness. Circumcised phallus. No masses/lesions on penis, testis, scrotum. 7cm right hydrocele  Lymph: No cervical or inguinal lymphadenopathy. Skin: No rashes, bruises or suspicious lesions. Neurologic: Grossly intact, no focal deficits, moving all 4 extremities. Psychiatric: Normal mood and affect.  Laboratory Data: Recent Labs       Lab Results  Component Value Date   WBC 5.9 08/24/2019   HGB 14.3 08/24/2019   HCT 42.2 08/24/2019   MCV 91.7 08/24/2019   PLT 195 08/24/2019      Recent Labs       Lab Results  Component Value Date   CREATININE 0.84 11/30/2019      Recent Labs       Lab Results  Component Value Date  PSA 0.44 01/24/2013      Recent Labs  No results found for: TESTOSTERONE    Recent Labs       Lab Results  Component Value Date   HGBA1C 6.2 (H) 08/23/2019      Urinalysis Labs (Brief)          Component Value Date/Time   COLORURINE STRAW (A) 08/23/2019 1906   APPEARANCEUR CLEAR 08/23/2019 1906   LABSPEC 1.008 08/23/2019 1906   PHURINE 6.0 08/23/2019 1906   GLUCOSEU NEGATIVE 08/23/2019 1906   HGBUR NEGATIVE 08/23/2019 1906   BILIRUBINUR NEGATIVE 08/23/2019 1906   KETONESUR NEGATIVE 08/23/2019 1906   PROTEINUR NEGATIVE 08/23/2019 1906   NITRITE NEGATIVE 08/23/2019 1906   LEUKOCYTESUR NEGATIVE 08/23/2019 1906      Recent Labs  No results found for: LABMICR, [place], RBCUA, LABEPIT, MUCUS, BACTERIA    Pertinent Imaging: CT 02/17/2020: Images reviewed and discussed with the  patient  No results found for this or any previous visit.  No results found for this or any previous visit.  No results found for this or any previous visit.  No results found for this or any previous visit.  No results found for this or any previous visit.  No results found for this or any previous visit.  No results found for this or any previous visit.  No results found for this or any previous visit.   Assessment & Plan:    1. Hydrocele, unspecified hydrocele type We discussed the management of hydroceles including observation, aspiration, and hydrocelectomy. After discussing the options the patient elects for hydrocelectomy. Risks/benefits/alternatives discussed

## 2020-05-24 NOTE — Discharge Instructions (Signed)
Hydrocelectomy, Adult, Care After This sheet gives you information about how to care for yourself after your procedure. Your health care provider may also give you more specific instructions. If you have problems or questions, contact your health care provider. What can I expect after the procedure? After your procedure, it is common to have:  Mild discomfort and swelling in the pouch that holds your testicles (scrotum).  Bruising of the scrotum. Follow these instructions at home: Medicines  Take over-the-counter and prescription medicines only as told by your health care provider.  Ask your health care provider if the medicine prescribed to you: ? Requires you to avoid driving or using heavy machinery. ? Can cause constipation. You may need to take these actions to prevent or treat constipation:  Drink enough fluid to keep your urine pale yellow.  Take over-the-counter or prescription medicines.  Eat foods that are high in fiber, such as beans, whole grains, and fresh fruits and vegetables.  Limit foods that are high in fat and processed sugars, such as fried or sweet foods. Bathing  Do not take baths, swim, or use a hot tub until your health care provider approves. Ask your health care provider if you may take showers. You may only be allowed to take sponge baths.  If you were told to wear an athletic support strap (scrotal support), keep it dry. Take it off when you shower or bathe. Incision care  Follow instructions from your health care provider about how to take care of your incision. Make sure you: ? Wash your hands with soap and water before and after you change your bandage (dressing). If soap and water are not available, use hand sanitizer. ? Change your dressing as told by your health care provider. ? Leave stitches (sutures), skin glue, or adhesive strips in place. These skin closures may need to stay in place for 2 weeks or longer. If adhesive strip edges start to  loosen and curl up, you may trim the loose edges. Do not remove adhesive strips completely unless your health care provider tells you to do that.  Check your incision and scrotum every day for signs of infection. Check for: ? More redness, swelling, or pain. ? Fluid or blood. ? Warmth. ? Pus or a bad smell.   Managing pain and swelling If directed, put ice on the affected area. To do this:  Put ice in a plastic bag.  Place a towel between your skin and the bag.  Leave the ice on for 20 minutes, 2-3 times per day.   Activity  Do not do any high-energy activities for as long as told by your health care provider.  Do not lift anything that is heavier than 10 lb (4.5 kg), or the limit that you are told, until your health care provider says that it is safe.  Return to your normal activities as told by your health care provider. Ask your health care provider what activities are safe for you.  Do not drive for 24 hours if you were given a sedative during your procedure.  Ask your health care provider when it is safe to drive. General instructions  Do not use any products that contain nicotine or tobacco, such as cigarettes, e-cigarettes, and chewing tobacco. These can delay incision healing after surgery. If you need help quitting, ask your health care provider.  If you were given a scrotal support, wear it as told by your health care provider.  If you had a drain put  in during the procedure, you will need to have it removed at a follow-up visit.  Keep all follow-up visits as told by your health care provider. This is important. Contact a health care provider if:  Your pain is not controlled with medicine.  You have more redness, swelling, or pain around your scrotum.  You have fluid or blood coming from your incision.  Your incision feels warm to the touch.  You have pus or a bad smell coming from your scrotum.  You have a fever. Get help right away if:  You develop  shaking, chills, and a fever that is higher than 101.80F (38.8C).  You have redness or swelling that starts at your scrotum and spreads outward to cover your whole groin.  You develop swelling of the legs or difficulty breathing. Summary  After a hydrocelectomy, it is common to have mild discomfort, swelling, and bruising.  Do not take baths, swim, or use a hot tub until your health care provider approves. Ask your health care provider if you may take showers.  If directed, put ice on the affected area to help with pain and swelling.  Do not do any high-energy activities or lift anything heavier than 10 lb (4.5 kg) for as long as told by your health care provider.  If you were given a scrotal support, keep it dry. Wear the scrotal support as told by your health care provider. This information is not intended to replace advice given to you by your health care provider. Make sure you discuss any questions you have with your health care provider. Document Revised: 07/27/2018 Document Reviewed: 07/27/2018 Elsevier Patient Education  2021 [place].   Acetaminophen; Oxycodone tablets What is this medicine? ACETAMINOPHEN; OXYCODONE (a set a MEE noe fen; ox i KOE done) is a pain reliever. It is used to treat moderate to severe pain. This medicine may be used for other purposes; ask your health care provider or pharmacist if you have questions. COMMON BRAND NAME(S): Endocet, Magnacet, Nalocet, Narvox, Percocet, Perloxx, Primalev, Primlev, Prolate, Roxicet, Xolox What should I tell my health care provider before I take this medicine? They need to know if you have any of these conditions:  brain tumor  drug abuse or addiction  head injury  heart disease  if you often drink alcohol   kidney disease   liver disease  low adrenal gland function  lung disease, asthma, or breathing problem  seizures  stomach or intestine problems  taken an MAOI like Marplan, Nardil, or  Parnate in the last 14 days  an unusual or allergic reaction to acetaminophen, oxycodone, other medicines, foods, dyes, or preservative  pregnant or trying to get pregnant  breast-feeding How should I use this medicine? Take this medicine by mouth with a full glass of water. Take it as directed on the label. You can take it with or without food. If it upsets your stomach, take it with food. Do not use it more often than directed. There may be unused or extra doses in the bottle after you finish your treatment. Talk to your health care provider if you have questions about your dose. A special MedGuide will be given to you by the pharmacist with each prescription and refill. Be sure to read this information carefully each time. Talk to your health care provider about the use of this medicine in children. Special care may be needed. Patients over 23 years of age may have a stronger reaction and need a smaller dose.  Overdosage: If you think you have taken too much of this medicine contact a poison control center or emergency room at once. NOTE: This medicine is only for you. Do not share this medicine with others. What if I miss a dose? This does not apply. This medicine is not for regular use. It should only be used as needed. What may interact with this medicine? This medicine may interact with the following medications:  alcohol  antihistamines for allergy, cough and cold  antiviral medicines for HIV or AIDS  atropine  certain antibiotics like clarithromycin, erythromycin, linezolid, rifampin  certain medicines for anxiety or sleep  certain medicines for bladder problems like oxybutynin, tolterodine  certain medicines for depression like amitriptyline, fluoxetine, sertraline  certain medicines for fungal infections like ketoconazole, itraconazole, voriconazole  certain medicines for migraine headache like almotriptan, eletriptan, frovatriptan, naratriptan, rizatriptan,  sumatriptan, zolmitriptan  certain medicines for nausea or vomiting like dolasetron, ondansetron, palonosetron  certain medicines for Parkinson's disease like benztropine, trihexyphenidyl  certain medicines for seizures like phenobarbital, phenytoin, primidone  certain medicines for stomach problems like dicyclomine, hyoscyamine  certain medicines for travel sickness like scopolamine  diuretics  general anesthetics like halothane, isoflurane, methoxyflurane, propofol  ipratropium  local anesthetics like lidocaine, pramoxine, tetracaine  MAOIs like Carbex, Eldepryl, Marplan, Nardil, and Parnate  medicines that relax muscles for surgery  methylene blue  nilotinib  other medicines with acetaminophen  other narcotic medicines for pain or cough  phenothiazines like chlorpromazine, mesoridazine, prochlorperazine, thioridazine This list may not describe all possible interactions. Give your health care provider a list of all the medicines, herbs, non-prescription drugs, or dietary supplements you use. Also tell them if you smoke, drink alcohol, or use illegal drugs. Some items may interact with your medicine. What should I watch for while using this medicine? Tell your health care provider if your pain does not go away, if it gets worse, or if you have new or a different type of pain. You may develop tolerance to this drug. Tolerance means that you will need a higher dose of the drug for pain relief. Tolerance is normal and is expected if you take this drug for a long time. There are different types of narcotic drugs (opioids) for pain. If you take more than one type at the same time, you may have more side effects. Give your health care provider a list of all drugs you use. He or she will tell you how much drug to take. Do not take more drug than directed. Get emergency help right away if you have problems breathing. Do not suddenly stop taking your drug because you may develop a severe  reaction. Your body becomes used to the drug. This does NOT mean you are addicted. Addiction is a behavior related to getting and using a drug for a nonmedical reason. If you have pain, you have a medical reason to take pain drug. Your health care provider will tell you how much drug to take. If your health care provider wants you to stop the drug, the dose will be slowly lowered over time to avoid any side effects. Talk to your health care provider about naloxone and how to get it. Naloxone is an emergency drug used for an opioid overdose. An overdose can happen if you take too much opioid. It can also happen if an opioid is taken with some other drugs or substances, like alcohol. Know the symptoms of an overdose, like trouble breathing, unusually tired or sleepy, or not  being able to respond or wake up. Make sure to tell caregivers and close contacts where it is stored. Make sure they know how to use it. After naloxone is given, you must get emergency help right away. Naloxone is a temporary treatment. Repeat doses may be needed. Do not take other drugs that contain acetaminophen with this drug. Many non-prescription drugs contain acetaminophen. Always read labels carefully. If you have questions, ask your health care provider. If you take too much acetaminophen, get medical help right away. Too much acetaminophen can be very dangerous and cause liver damage. Even if you do not have symptoms, it is important to get help right away. This drug does not prevent a heart attack or stroke. This drug may increase the chance of a heart attack or stroke. The chance may increase the longer you use this drug or if you have heart disease. If you take aspirin to prevent a heart attack or stroke, talk to your health care provider about using this drug. You may get drowsy or dizzy. Do not drive, use machinery, or do anything that needs mental alertness until you know how this drug affects you. Do not stand up or sit up  quickly, especially if you are an older patient. This reduces the risk of dizzy or fainting spells. Alcohol may interfere with the effect of this drug. Avoid alcoholic drinks. This drug will cause constipation. If you do not have a bowel movement for 3 days, call your health care provider. Your mouth may get dry. Chewing sugarless gum or sucking hard candy and drinking plenty of water may help. Contact your health care provider if the problem does not go away or is severe. What side effects may I notice from receiving this medicine? Side effects that you should report to your doctor or health care professional as soon as possible:  allergic reactions (skin rash, itching or hives; swelling of the face, lips, or tongue)  confusion  kidney injury (trouble passing urine or change in the amount of urine)  light-colored stool  liver injury (dark yellow or brown urine; general ill feeling or flu-like symptoms; loss of appetite, right upper belly pain; unusually weak or tired, yellowing of the eyes or skin)  low adrenal gland function (nausea; vomiting; loss of appetite; unusually weak or tired; dizziness; low blood pressure)  low blood pressure (dizziness; feeling faint or lightheaded, falls; unusually weak or tired)  redness, blistering, peeling, or loosening of the skin, including inside the mouth  serotonin syndrome (irritable; confusion; diarrhea; fast or irregular heartbeat; muscle twitching; stiff muscles; trouble walking; sweating; high fever; seizures; chills; vomiting)  trouble breathing Side effects that usually do not require medical attention (report to your doctor or health care professional if they continue or are bothersome):  constipation  dry mouth  nausea, vomiting  tiredness This list may not describe all possible side effects. Call your doctor for medical advice about side effects. You may report side effects to FDA at 1-800-FDA-1088. Where should I keep my  medicine? Keep out of the reach of children and pets. This medicine can be abused. Keep it in a safe place to protect it from theft. Do not share it with anyone. It is only for you. Selling or giving away this medicine is dangerous and against the law. Store at room temperature between 20 and 25 degrees C (68 and 77 degrees F). Protect from light. Get rid of any unused medicine after the expiration date. This medicine may cause harm  and death if it is taken by other adults, children, or pets. It is important to get rid of the medicine as soon as you no longer need it or it is expired. You can do this in two ways:  Take the medicine to a medicine take-back program. Check with your pharmacy or law enforcement to find a location.  If you cannot return the medicine, flush it down the toilet. NOTE: This sheet is a summary. It may not cover all possible information. If you have questions about this medicine, talk to your doctor, pharmacist, or health care provider.  2021 Elsevier/Gold Standard (2019-11-30 11:12:15)   General Anesthesia, Adult, Care After This sheet gives you information about how to care for yourself after your procedure. Your health care provider may also give you more specific instructions. If you have problems or questions, contact your health care provider. What can I expect after the procedure? After the procedure, the following side effects are common:  Pain or discomfort at the IV site.  Nausea.  Vomiting.  Sore throat.  Trouble concentrating.  Feeling cold or chills.  Feeling weak or tired.  Sleepiness and fatigue.  Soreness and body aches. These side effects can affect parts of the body that were not involved in surgery. Follow these instructions at home: For the time period you were told by your health care provider:  Rest.  Do not participate in activities where you could fall or become injured.  Do not drive or use machinery.  Do not drink  alcohol.  Do not take sleeping pills or medicines that cause drowsiness.  Do not make important decisions or sign legal documents.  Do not take care of children on your own.   Eating and drinking  Follow any instructions from your health care provider about eating or drinking restrictions.  When you feel hungry, start by eating small amounts of foods that are soft and easy to digest (bland), such as toast. Gradually return to your regular diet.  Drink enough fluid to keep your urine pale yellow.  If you vomit, rehydrate by drinking water, juice, or clear broth. General instructions  If you have sleep apnea, surgery and certain medicines can increase your risk for breathing problems. Follow instructions from your health care provider about wearing your sleep device: ? Anytime you are sleeping, including during daytime naps. ? While taking prescription pain medicines, sleeping medicines, or medicines that make you drowsy.  Have a responsible adult stay with you for the time you are told. It is important to have someone help care for you until you are awake and alert.  Return to your normal activities as told by your health care provider. Ask your health care provider what activities are safe for you.  Take over-the-counter and prescription medicines only as told by your health care provider.  If you smoke, do not smoke without supervision.  Keep all follow-up visits as told by your health care provider. This is important. Contact a health care provider if:  You have nausea or vomiting that does not get better with medicine.  You cannot eat or drink without vomiting.  You have pain that does not get better with medicine.  You are unable to pass urine.  You develop a skin rash.  You have a fever.  You have redness around your IV site that gets worse. Get help right away if:  You have difficulty breathing.  You have chest pain.  You have blood in your urine or  stool, or  you vomit blood. Summary  After the procedure, it is common to have a sore throat or nausea. It is also common to feel tired.  Have a responsible adult stay with you for the time you are told. It is important to have someone help care for you until you are awake and alert.  When you feel hungry, start by eating small amounts of foods that are soft and easy to digest (bland), such as toast. Gradually return to your regular diet.  Drink enough fluid to keep your urine pale yellow.  Return to your normal activities as told by your health care provider. Ask your health care provider what activities are safe for you. This information is not intended to replace advice given to you by your health care provider. Make sure you discuss any questions you have with your health care provider. Document Revised: 11/17/2019 Document Reviewed: 06/16/2019 Elsevier Patient Education  2021 [employer].

## 2020-05-24 NOTE — Anesthesia Postprocedure Evaluation (Signed)
Anesthesia Post Note  Patient: Darral Dash  Procedure(s) Performed: HYDROCELECTOMY ADULT (Right Scrotum)  Patient location during evaluation: PACU Anesthesia Type: General Level of consciousness: awake and alert and oriented Pain management: pain level controlled Vital Signs Assessment: post-procedure vital signs reviewed and stable Respiratory status: spontaneous breathing and respiratory function stable Cardiovascular status: blood pressure returned to baseline and stable Postop Assessment: no apparent nausea or vomiting Anesthetic complications: no   No complications documented.   Last Vitals:  Vitals:   05/24/20 1400 05/24/20 1423  BP: (!) 149/85 (!) 151/88  Pulse: 70 69  Resp: 10 14  Temp:  (!) 36.3 C  SpO2: 96% 100%    Last Pain:  Vitals:   05/24/20 1423  TempSrc: Axillary  PainSc: 2                  Renlee Floor C Lafawn Lenoir

## 2020-05-24 NOTE — Transfer of Care (Signed)
Immediate Anesthesia Transfer of Care Note  Patient: Darral Dash  Procedure(s) Performed: HYDROCELECTOMY ADULT (Right Scrotum)  Patient Location: PACU  Anesthesia Type:General  Level of Consciousness: awake  Airway & Oxygen Therapy: Patient Spontanous Breathing and Patient connected to nasal cannula oxygen  Post-op Assessment: Report given to RN and Post -op Vital signs reviewed and stable  Post vital signs: Reviewed and stable  Last Vitals:  Vitals Value Taken Time  BP    Temp    Pulse 84 05/24/20 1330  Resp    SpO2 98 % 05/24/20 1330  Vitals shown include unvalidated device data.  Last Pain:  Vitals:   05/24/20 1034  TempSrc: Oral  PainSc: 0-No pain      Patients Stated Pain Goal: 8 (17/35/67 0141)  Complications: No complications documented.

## 2020-05-24 NOTE — Op Note (Signed)
Preoperative diagnosis: Right Hydrocele  Postoperative diagnosis: Same  Procedure: 1. Excision of right appendix testis 2. Right hydrocelectomy  Attending: Nicolette Bang, MD  Anesthesia: General  History of blood loss: Minimal  Antibiotics: ancef  Drains: none  Specimens: 1. Right hydrocele sac   Findings: 10cm hydrocele  Indications: Patient is a 70 year old male with a history of right hydrocele that was growing in size and causing him pain with walking.  We discussed the treatment options including observation versus excision after discussing treatment options he proceed with excision.   Procedure in detail: Prior to procedure consent was obtained.  Patient was brought to the operating room and a brief timeout was done to ensure correct patient, correct procedure, correct site.  General anesthesia was administered and patient was placed in supine position.  His genitalia was then prepped and draped in usual sterile fashion.  A 5 cm incision was made in the right hemiscrotum.  We dissected down to the tunica and then incised the tunica. A large hydrocele was encountered and was drained. We then excised the hydrocele sac and then over sewed the edge with 2-0 Vicryl in a running fashion. We then excised the right appendix testis. Hemostasis was then obtained with electrocautery. We then closed the defect in the epididymis with 3-0 vicryl in a running fashion. We then returned the testis to the left hemiscrotum and closed the overlying dartos with 3-0 vicryl in a running fashion. The skin was then closed with 4-0 monocryl in a running fashion. Dermabond was placed on the incision.  A dressing was then applied to the incision.  We then placed a scrotal fluff and this then concluded the procedure which was well tolerated by the patient.  Complications: None  Condition: Stable, extubated, transferred to PACU.  Plan: Patient is to be discharged home.  He is to follow up in 2 weeks for  wound check.

## 2020-05-24 NOTE — Anesthesia Procedure Notes (Signed)
Procedure Name: LMA Insertion Date/Time: 05/24/2020 12:42 PM Performed by: Karna Dupes, CRNA Pre-anesthesia Checklist: Patient identified, Emergency Drugs available, Suction available and Patient being monitored Patient Re-evaluated:Patient Re-evaluated prior to induction Oxygen Delivery Method: Circle system utilized Preoxygenation: Pre-oxygenation with 100% oxygen Induction Type: IV induction LMA: LMA inserted LMA Size: 4.0 Number of attempts: 1 Tube secured with: Tape Dental Injury: Teeth and Oropharynx as per pre-operative assessment

## 2020-05-24 NOTE — Anesthesia Preprocedure Evaluation (Signed)
Anesthesia Evaluation  Patient identified by MRN, date of birth, ID band Patient awake    Reviewed: Allergy & Precautions, NPO status , Patient's Chart, lab work & pertinent test results  History of Anesthesia Complications Negative for: history of anesthetic complications  Airway Mallampati: III  TM Distance: >3 FB Neck ROM: Full    Dental  (+) Dental Advisory Given, Teeth Intact   Pulmonary former smoker,    Pulmonary exam normal breath sounds clear to auscultation       Cardiovascular Exercise Tolerance: Good Normal cardiovascular exam Rhythm:Regular Rate:Normal     Neuro/Psych CVA, Residual Symptoms    GI/Hepatic Neg liver ROS, GERD  Controlled,  Endo/Other  negative endocrine ROS  Renal/GU negative Renal ROS     Musculoskeletal negative musculoskeletal ROS (+)   Abdominal   Peds  Hematology negative hematology ROS (+)   Anesthesia Other Findings   Reproductive/Obstetrics negative OB ROS                            Anesthesia Physical  Anesthesia Plan  ASA: III  Anesthesia Plan: General   Post-op Pain Management:    Induction: Intravenous  PONV Risk Score and Plan: 4 or greater and Ondansetron, Dexamethasone and Midazolam  Airway Management Planned: LMA  Additional Equipment:   Intra-op Plan:   Post-operative Plan:   Informed Consent: I have reviewed the patients History and Physical, chart, labs and discussed the procedure including the risks, benefits and alternatives for the proposed anesthesia with the patient or authorized representative who has indicated his/her understanding and acceptance.     Dental advisory given  Plan Discussed with: CRNA and Surgeon  Anesthesia Plan Comments:         Anesthesia Quick Evaluation

## 2020-05-25 ENCOUNTER — Encounter (HOSPITAL_COMMUNITY): Payer: Self-pay | Admitting: Urology

## 2020-05-28 LAB — SURGICAL PATHOLOGY

## 2020-06-04 ENCOUNTER — Encounter: Payer: Self-pay | Admitting: Family Medicine

## 2020-06-04 ENCOUNTER — Other Ambulatory Visit: Payer: Self-pay

## 2020-06-04 ENCOUNTER — Ambulatory Visit (INDEPENDENT_AMBULATORY_CARE_PROVIDER_SITE_OTHER): Payer: BC Managed Care – PPO | Admitting: Family Medicine

## 2020-06-04 VITALS — BP 118/72 | HR 79 | Temp 97.3°F | Ht 68.0 in | Wt 168.0 lb

## 2020-06-04 DIAGNOSIS — Z1159 Encounter for screening for other viral diseases: Secondary | ICD-10-CM

## 2020-06-04 DIAGNOSIS — E7849 Other hyperlipidemia: Secondary | ICD-10-CM | POA: Diagnosis not present

## 2020-06-04 DIAGNOSIS — Z79899 Other long term (current) drug therapy: Secondary | ICD-10-CM

## 2020-06-04 DIAGNOSIS — I6529 Occlusion and stenosis of unspecified carotid artery: Secondary | ICD-10-CM

## 2020-06-04 NOTE — Patient Instructions (Signed)
Do your labs Then may stop crestor Then give [place] an update in 3 to 4 weeks About does the medicine cause your hand pain or not  The plan would be to re initiate the meds at a lower dose if the pain was do to the meds

## 2020-06-04 NOTE — Progress Notes (Signed)
   Subjective:    Patient ID: Ronnie Walker, male    DOB: 07/20/1950, 70 y.o.   MRN: [id number]  Hyperlipidemia This is a chronic problem. Pertinent negatives include no chest pain or shortness of breath. Treatments tried: rosuvastatin 40mg .   Patient overall doing well He does relate a lot of joint pains and body aches he feels is due to rosuvastatin He is interested in coming off of the medicine wonders if he can just watch things with diet We did review with him he is at risk for cardiovascular disease and stroke and talk to him about the importance of keeping this under control by managing risk factors. aving joint pain and body aches. Pt thinks it is from rosuvastatin.     Review of Systems  Constitutional: Negative for activity change, fatigue and fever.  HENT: Negative for congestion and rhinorrhea.   Respiratory: Negative for cough and shortness of breath.   Cardiovascular: Negative for chest pain and leg swelling.  Gastrointestinal: Negative for abdominal pain, diarrhea and nausea.  Genitourinary: Negative for dysuria and hematuria.  Musculoskeletal: Positive for arthralgias.  Neurological: Negative for weakness and headaches.  Psychiatric/Behavioral: Negative for agitation and behavioral problems.       Objective:   Physical Exam Vitals reviewed.  Cardiovascular:     Rate and Rhythm: Normal rate and regular rhythm.     Heart sounds: Normal heart sounds. No murmur heard.   Pulmonary:     Effort: Pulmonary effort is normal.     Breath sounds: Normal breath sounds.  Lymphadenopathy:     Cervical: No cervical adenopathy.  Neurological:     Mental Status: He is alert.  Psychiatric:        Behavior: Behavior normal.           Assessment & Plan:  So based upon his findings it is possible that the joint discomfort is from the rosuvastatin but it is also possible it could just be related to getting older.  He does work full-time in [employer].  He will hold off on  the medicine over the course of the next 3 to 4 weeks and he will note whether or not his discomfort goes away if it does it could be the medicine we will try different medicine for the same medicine at a lower dose 2 or 3 times a week that being said if the pain does not go away then it is not due to the medication and he will continue the medicine so therefore he will give [place] an update in a few weeks  Hold off on repeating carotid ultrasound right now follow-up again in 6 months Labs pending

## 2020-06-08 ENCOUNTER — Other Ambulatory Visit: Payer: Self-pay

## 2020-06-08 ENCOUNTER — Ambulatory Visit (INDEPENDENT_AMBULATORY_CARE_PROVIDER_SITE_OTHER): Payer: BC Managed Care – PPO | Admitting: Urology

## 2020-06-08 ENCOUNTER — Encounter: Payer: Self-pay | Admitting: Urology

## 2020-06-08 VITALS — BP 134/84 | HR 69 | Temp 99.0°F | Resp 16 | Ht 68.0 in | Wt 168.0 lb

## 2020-06-08 DIAGNOSIS — N433 Hydrocele, unspecified: Secondary | ICD-10-CM

## 2020-06-08 LAB — URINALYSIS, ROUTINE W REFLEX MICROSCOPIC
Bilirubin, UA: NEGATIVE
Glucose, UA: NEGATIVE
Ketones, UA: NEGATIVE
Leukocytes,UA: NEGATIVE
Nitrite, UA: NEGATIVE
Protein,UA: NEGATIVE
RBC, UA: NEGATIVE
Specific Gravity, UA: <1.005 — ABNORMAL LOW (ref 1.005–1.030)
Urobilinogen, Ur: 0.2 mg/dL (ref 0.2–1.0)
pH, UA: 6.5 (ref 5.0–7.5)

## 2020-06-08 MED ORDER — SULFAMETHOXAZOLE-TRIMETHOPRIM 800-160 MG PO TABS: 1.0000 | ORAL_TABLET | Freq: Two times a day (BID) | ORAL | 0 refills | Status: DC

## 2020-06-08 NOTE — Patient Instructions (Signed)
Current Urology, 10(1), 1-14. https://[email or web address].[email or web address]">  Hydrocelectomy, Adult, Care After This sheet gives you information about how to care for yourself after your procedure. Your health care provider may also give you more specific instructions. If you have problems or questions, contact your health care provider. What can I expect after the procedure? After your procedure, it is common to have:  Mild discomfort and swelling in the pouch that holds your testicles (scrotum).  Bruising of the scrotum. Follow these instructions at home: Medicines  Take over-the-counter and prescription medicines only as told by your health care provider.  Ask your health care provider if the medicine prescribed to you: ? Requires you to avoid driving or using heavy machinery. ? Can cause constipation. You may need to take these actions to prevent or treat constipation:  Drink enough fluid to keep your urine pale yellow.  Take over-the-counter or prescription medicines.  Eat foods that are high in fiber, such as beans, whole grains, and fresh fruits and vegetables.  Limit foods that are high in fat and processed sugars, such as fried or sweet foods. Bathing  Do not take baths, swim, or use a hot tub until your health care provider approves. Ask your health care provider if you may take showers. You may only be allowed to take sponge baths.  If you were told to wear an athletic support strap (scrotal support), keep it dry. Take it off when you shower or bathe. Incision care  Follow instructions from your health care provider about how to take care of your incision. Make sure you: ? Wash your hands with soap and water before and after you change your bandage (dressing). If soap and water are not available, use hand sanitizer. ? Change your dressing as told by your health care provider. ? Leave stitches (sutures), skin glue, or adhesive strips in place. These skin closures may need to stay in  place for 2 weeks or longer. If adhesive strip edges start to loosen and curl up, you may trim the loose edges. Do not remove adhesive strips completely unless your health care provider tells you to do that.  Check your incision and scrotum every day for signs of infection. Check for: ? More redness, swelling, or pain. ? Fluid or blood. ? Warmth. ? Pus or a bad smell.   Managing pain and swelling If directed, put ice on the affected area. To do this:  Put ice in a plastic bag.  Place a towel between your skin and the bag.  Leave the ice on for 20 minutes, 2-3 times per day.   Activity  Do not do any high-energy activities for as long as told by your health care provider.  Do not lift anything that is heavier than 10 lb (4.5 kg), or the limit that you are told, until your health care provider says that it is safe.  Return to your normal activities as told by your health care provider. Ask your health care provider what activities are safe for you.  Do not drive for 24 hours if you were given a sedative during your procedure.  Ask your health care provider when it is safe to drive. General instructions  Do not use any products that contain nicotine or tobacco, such as cigarettes, e-cigarettes, and chewing tobacco. These can delay incision healing after surgery. If you need help quitting, ask your health care provider.  If you were given a scrotal support, wear it as told by your health care provider.  If you had a drain put in during the procedure, you will need to have it removed at a follow-up visit.  Keep all follow-up visits as told by your health care provider. This is important. Contact a health care provider if:  Your pain is not controlled with medicine.  You have more redness, swelling, or pain around your scrotum.  You have fluid or blood coming from your incision.  Your incision feels warm to the touch.  You have pus or a bad smell coming from your  scrotum.  You have a fever. Get help right away if:  You develop shaking, chills, and a fever that is higher than 101.53F (38.8C).  You have redness or swelling that starts at your scrotum and spreads outward to cover your whole groin.  You develop swelling of the legs or difficulty breathing. Summary  After a hydrocelectomy, it is common to have mild discomfort, swelling, and bruising.  Do not take baths, swim, or use a hot tub until your health care provider approves. Ask your health care provider if you may take showers.  If directed, put ice on the affected area to help with pain and swelling.  Do not do any high-energy activities or lift anything heavier than 10 lb (4.5 kg) for as long as told by your health care provider.  If you were given a scrotal support, keep it dry. Wear the scrotal support as told by your health care provider. This information is not intended to replace advice given to you by your health care provider. Make sure you discuss any questions you have with your health care provider. Document Revised: 07/27/2018 Document Reviewed: 07/27/2018 Elsevier Patient Education  [employer].

## 2020-06-08 NOTE — Progress Notes (Signed)
06/08/2020 1:43 PM   Ronnie Walker [birth year] [id number]  Referring provider: Kathyrn Drown, [place],  [place] [id number]  Chief Complaint  Patient presents with  . Routine Post Op    HPI: Ronnie Walker is a 70yo here for followup after hydrocelectomy. No drainage from incision. No swelling or erythema. He denies any LUTS.    PMH: Past Medical History:  Diagnosis Date  . CVA (cerebral vascular accident) ([place])    08/2019  . Hyperlipidemia     Surgical History: Past Surgical History:  Procedure Laterality Date  . BACK SURGERY    . COLONOSCOPY WITH PROPOFOL N/A 03/27/2020   Procedure: COLONOSCOPY WITH PROPOFOL;  Surgeon: Eloise Harman, DO;  Location: [facility];  Service: Endoscopy;  Laterality: N/A;  12:45pm  . HYDROCELE EXCISION Right 05/24/2020   Procedure: HYDROCELECTOMY ADULT;  Surgeon: Cleon Gustin, MD;  Location: [facility];  Service: Urology;  Laterality: Right;  . INGUINAL HERNIA REPAIR Right   . POLYPECTOMY  03/27/2020   Procedure: POLYPECTOMY INTESTINAL;  Surgeon: Eloise Harman, DO;  Location: [facility];  Service: Endoscopy;;  . VASECTOMY      Home Medications:  Allergies as of 06/08/2020   No Known Allergies     Medication List       Accurate as of June 08, 2020  1:43 PM. If you have any questions, ask your nurse or doctor.        STOP taking these medications   oxyCODONE-acetaminophen 5-325 MG tablet Commonly known as: Percocet Stopped by: Nicolette Bang, MD     TAKE these medications   aspirin EC 81 MG tablet Take 81 mg by mouth daily. Swallow whole.   rosuvastatin 40 MG tablet Commonly known as: Crestor Take 1 tablet (40 mg total) by mouth daily.   sulfamethoxazole-trimethoprim 800-160 MG tablet Commonly known as: BACTRIM DS Take 1 tablet by mouth every 12 (twelve) hours. Started by: Nicolette Bang, MD       Allergies: No Known Allergies  Family History: Family History  Problem Relation  Age of Onset  . Cancer Mother        breast  . Diabetes Mother   . Hypertension Father   . Heart disease Father   . COPD Father   . Stroke Father   . Colon cancer Neg Hx     Social History:  reports that he has quit smoking. He quit smokeless tobacco use about 17 years ago. He reports current alcohol use. He reports that he does not use drugs.  ROS: All other review of systems were reviewed and are negative except what is noted above in HPI  Physical Exam: BP 134/84   Pulse 69   Temp 99 F (37.2 C) (Oral)   Resp 16   Ht 5\' 8"  (1.727 m)   Wt 168 lb (76.2 kg)   BMI 25.54 kg/m   Constitutional:  Alert and oriented, No acute distress. HEENT: [place] AT, moist mucus membranes.  Trachea midline, no masses. Cardiovascular: No clubbing, cyanosis, or edema. Respiratory: Normal respiratory effort, no increased work of breathing. GI: Abdomen is soft, nontender, nondistended, no abdominal masses GU: No CVA tenderness. Healing scrotal incision.  Lymph: No cervical or inguinal lymphadenopathy. Skin: No rashes, bruises or suspicious lesions. Neurologic: Grossly intact, no focal deficits, moving all 4 extremities. Psychiatric: Normal mood and affect.  Laboratory Data: Lab Results  Component Value Date   WBC 5.9 08/24/2019   HGB 14.3  08/24/2019   HCT 42.2 08/24/2019   MCV 91.7 08/24/2019   PLT 195 08/24/2019    Lab Results  Component Value Date   CREATININE 0.84 11/30/2019    Lab Results  Component Value Date   PSA 0.44 01/24/2013    No results found for: TESTOSTERONE  Lab Results  Component Value Date   HGBA1C 6.2 (H) 08/23/2019    Urinalysis    Component Value Date/Time   COLORURINE STRAW (A) 08/23/2019 1906   APPEARANCEUR Clear 04/13/2020 0957   LABSPEC 1.008 08/23/2019 1906   PHURINE 6.0 08/23/2019 1906   GLUCOSEU Negative 04/13/2020 0957   HGBUR NEGATIVE 08/23/2019 1906   BILIRUBINUR Negative 04/13/2020 0957   KETONESUR NEGATIVE 08/23/2019 1906   PROTEINUR  Negative 04/13/2020 0957   PROTEINUR NEGATIVE 08/23/2019 1906   NITRITE Negative 04/13/2020 0957   NITRITE NEGATIVE 08/23/2019 1906   LEUKOCYTESUR Negative 04/13/2020 0957   LEUKOCYTESUR NEGATIVE 08/23/2019 1906    Lab Results  Component Value Date   LABMICR Comment 04/13/2020    Pertinent Imaging:  No results found for this or any previous visit.  No results found for this or any previous visit.  No results found for this or any previous visit.  No results found for this or any previous visit.  No results found for this or any previous visit.  No results found for this or any previous visit.  No results found for this or any previous visit.  No results found for this or any previous visit.   Assessment & Plan:    1. Hydrocele, unspecified hydrocele type -RTC 4 weeks for wound check - Urinalysis, Routine w reflex microscopic   No follow-ups on file.  Nicolette Bang, MD  [facility] [place]

## 2020-06-13 DIAGNOSIS — E7849 Other hyperlipidemia: Secondary | ICD-10-CM | POA: Diagnosis not present

## 2020-06-13 DIAGNOSIS — Z1159 Encounter for screening for other viral diseases: Secondary | ICD-10-CM | POA: Diagnosis not present

## 2020-06-13 DIAGNOSIS — Z79899 Other long term (current) drug therapy: Secondary | ICD-10-CM | POA: Diagnosis not present

## 2020-06-14 LAB — HEPATIC FUNCTION PANEL
ALT: 40 IU/L (ref 0–44)
AST: 25 IU/L (ref 0–40)
Albumin: 4.6 g/dL (ref 3.8–4.8)
Alkaline Phosphatase: 115 IU/L (ref 44–121)
Bilirubin Total: 0.4 mg/dL (ref 0.0–1.2)
Bilirubin, Direct: 0.1 mg/dL (ref 0.00–0.40)
Total Protein: 7.2 g/dL (ref 6.0–8.5)

## 2020-06-14 LAB — LIPID PANEL
Chol/HDL Ratio: 3.4 ratio (ref 0.0–5.0)
Cholesterol, Total: 194 mg/dL (ref 100–199)
HDL: 57 mg/dL (ref >39)
LDL Chol Calc (NIH): 103 mg/dL — ABNORMAL HIGH (ref 0–99)
Triglycerides: 200 mg/dL — ABNORMAL HIGH (ref 0–149)
VLDL Cholesterol Cal: 34 mg/dL (ref 5–40)

## 2020-06-14 LAB — HEPATITIS C ANTIBODY: Hep C Virus Ab: <0.1 s/co ratio (ref 0.0–0.9)

## 2020-07-02 ENCOUNTER — Telehealth: Payer: Self-pay

## 2020-07-02 NOTE — Telephone Encounter (Signed)
Patient calling about return to work.   Needs a note from his doctor stating he can or cannot return. He is scheduled to return on Monday, April 25th. He needs to let his work know asap because he is going out of town on Wed., April 20th and will not be back until the weekend.  Currently he is having a stinging sensation and pain in the incision area at times, not consistently.   Please call pt back at [phone] on Mon or Tues to let him know.  Thanks, Helene Kelp

## 2020-07-02 NOTE — Telephone Encounter (Signed)
Patient called- patient aware Dr. Alyson Ingles is back in office and will discuss return date tomorrow when Dr. Nichola Sizer. Patient voiced understanding.

## 2020-07-03 NOTE — Progress Notes (Signed)
Letter dictated for patient to return to work. Letter left at desk for patient. Patient aware.

## 2020-07-03 NOTE — Telephone Encounter (Signed)
Letter created. Patient aware. Left at front desk for pt.

## 2020-07-13 ENCOUNTER — Telehealth: Payer: Self-pay

## 2020-07-13 NOTE — Telephone Encounter (Signed)
Pt come by and was wanting to know he was on rosuvastatin (CRESTOR) 40 MG tablet pt was having pain in right had and muscle. Pt said discussed last visit not to take for 4 or 5 weeks or so and pt is saving it is better since he is not taking it. Pt is wanting to know if if he Dr Nicki Reaper wants him to start out with a lower dose.   Pt call back [phone]

## 2020-07-16 MED ORDER — ROSUVASTATIN CALCIUM 20 MG PO TABS: ORAL_TABLET | ORAL | 4 refills | Status: DC

## 2020-07-16 NOTE — Telephone Encounter (Signed)
May go down to Crestor 20 mg 1 daily, #30, 4 refills  If he is unable to tolerate this dose he should let us know

## 2020-07-16 NOTE — Telephone Encounter (Signed)
Please advise. Thank you

## 2020-07-16 NOTE — Telephone Encounter (Signed)
Pt contacted and verbalized understanding. New dose of Crestor sent to pharmacy

## 2020-07-18 ENCOUNTER — Ambulatory Visit: Payer: BC Managed Care – PPO | Admitting: Urology

## 2020-10-05 NOTE — Progress Notes (Signed)
NEUROLOGY FOLLOW UP OFFICE NOTE  DIXIE SCHNIPKE [id number]  Assessment/Plan:   Left thalamic infarct secondary to small vessel disease Hyperlipidemia Elevated blood pressure  Secondary stroke prevention as managed by PCP: - ASA '81mg'$  daily  - Statin.  LDL goal less than 70  - normotensive blood pressure  - Hgb A1c goal less than 7 Mediterranean diet Routine exercise Follow up as needed   Subjective:  Ronnie Walker is a 69 year old right-handed male with HLD who follows up for left thalamic stroke.   UPDATE: Current medications:  ASA '81mg'$ , Crestor '20mg'$    Labs from March include LDL 103.  He reports muscle aches so he stopped Crestor in March for a few weeks.  Muscle aches improved so he restarted at lower dose of '20mg'$ .     HISTORY: Patient was admitted to [facility] on 08/23/2019 after presenting with right sided numbness and tingling that began the previous night.  He initially noted paresthesias on the right side of his mouth, followed by paresthesias involving the right hand.  No associated headache, weakness, dizziness, visual disturbance or abnormal gait.  Blood pressure in the ED was 180/87.  EKG showed sinus rhythm.  CT head personally reviewed was negative for acute intracranial abnormality but MRI of brain personally reviewed demonstrated a small acute ischemic infarct within the left thalamus.  He did not receive tPA due to outside window and improving symptoms.  MRA of head was negative for large vessel occlusion or significant stenosis.  Carotid ultrasound showed no hemodynamically significant stenosis.  Echocardiogram showed EF 60-65% with no cardiac source of embolus (no thrombus or atrial level shunt). LDL was 179.  Hgb A1c was  6.2.  Tingling and numbness resolved over the next couple of days.     08/23/2019 CT HEAD WO:  Negative head CT 08/23/2019 MRI BRAIN WO:  Small focus of acute inschemia within the left thalamus.  No hemorrhage or mass  effect. 08/23/2019 MRA HEAD:  Normal intracranial MRA. 08/24/2019 CAROTID US:  1. Small amount of plaque at the right carotid bulb and proximal right internal carotid artery. Estimated degree of stenosis in the right internal carotid artery is less than 50%.  2. Intimal thickening in the left carotid arteries without significant stenosis.  3. Patent vertebral arteries with antegrade flow. 08/24/2019 ECHOCARDIOGRAM:  1. Left ventricular ejection fraction, by estimation, is 60 to 65%. The left ventricle has normal function. The left ventricle has no regional wall motion abnormalities. There is mild left ventricular hypertrophy. Left ventricular diastolic parameters were normal.  2. Right ventricular systolic function is normal. The right ventricular size is normal. Tricuspid regurgitation signal is inadequate for assessing PA pressure.  3. The mitral valve is grossly normal. Trivial mitral valve regurgitation.  4. The aortic valve is tricuspid. Aortic valve regurgitation is not visualized.  5. The inferior vena cava is normal in size with greater than 50% respiratory variability, suggesting right atrial pressure of 3 mmHg.  PAST MEDICAL HISTORY: Past Medical History:  Diagnosis Date   CVA (cerebral vascular accident) ([place])    08/2019   Hyperlipidemia     MEDICATIONS: Current Outpatient Medications on File Prior to Visit  Medication Sig Dispense Refill   aspirin EC 81 MG tablet Take 81 mg by mouth daily. Swallow whole.     rosuvastatin (CRESTOR) 20 MG tablet Take one tablet po each day 30 tablet 4   sulfamethoxazole-trimethoprim (BACTRIM DS) 800-160 MG tablet Take 1 tablet by mouth every  12 (twelve) hours. 14 tablet 0   No current facility-administered medications on file prior to visit.    ALLERGIES: No Known Allergies  FAMILY HISTORY: Family History  Problem Relation Age of Onset   Cancer Mother        breast   Diabetes Mother    Hypertension Father    Heart disease Father    COPD  Father    Stroke Father    Colon cancer Neg Hx       Objective:  Blood pressure (!) 152/89, pulse 72, height '5\' 6"'$  (1.676 m), weight 167 lb 3.2 oz (75.8 kg), SpO2 97 %. General: No acute distress.  Patient appears well-groomed.   Head:  Normocephalic/atraumatic Eyes:  Fundi examined but not visualized Neck: supple, no paraspinal tenderness, full range of motion Heart:  Regular rate and rhythm Lungs:  Clear to auscultation bilaterally Back: No paraspinal tenderness Neurological Exam: alert and oriented to person, place, and time.  Speech fluent and not dysarthric, language intact.  CN II-XII intact. Bulk and tone normal, muscle strength 5/5 throughout.  Sensation to light touch, temperature and vibration intact.  Deep tendon reflexes 2+ throughout, toes downgoing.  Finger to nose testing intact.  Gait normal, Romberg negative.   Metta Clines, DO  CC: Sallee Lange, MD

## 2020-10-08 ENCOUNTER — Encounter: Payer: Self-pay | Admitting: Neurology

## 2020-10-08 ENCOUNTER — Other Ambulatory Visit: Payer: Self-pay

## 2020-10-08 ENCOUNTER — Ambulatory Visit (INDEPENDENT_AMBULATORY_CARE_PROVIDER_SITE_OTHER): Payer: BC Managed Care – PPO | Admitting: Neurology

## 2020-10-08 VITALS — BP 152/89 | HR 72 | Ht 66.0 in | Wt 167.2 lb

## 2020-10-08 DIAGNOSIS — R030 Elevated blood-pressure reading, without diagnosis of hypertension: Secondary | ICD-10-CM | POA: Diagnosis not present

## 2020-10-08 DIAGNOSIS — I6381 Other cerebral infarction due to occlusion or stenosis of small artery: Secondary | ICD-10-CM

## 2020-10-08 DIAGNOSIS — E7849 Other hyperlipidemia: Secondary | ICD-10-CM | POA: Diagnosis not present

## 2020-10-08 DIAGNOSIS — I639 Cerebral infarction, unspecified: Secondary | ICD-10-CM

## 2020-10-08 NOTE — Patient Instructions (Signed)
Continue aspirin '81mg'$  daily Continue rosuvastatin Mediterranean diet Routine exercise   Mediterranean Diet A Mediterranean diet refers to food and lifestyle choices that are based on the traditions of countries located on the [employer]. This way of eating has been shown to help prevent certain conditions and improve outcomes forpeople who have chronic diseases, like kidney disease and heart disease. What are tips for following this plan? Lifestyle Cook and eat meals together with your family, when possible. Drink enough fluid to keep your urine clear or pale yellow. Be physically active every day. This includes: Aerobic exercise like running or swimming. Leisure activities like gardening, walking, or housework. Get 7-8 hours of sleep each night. If recommended by your health care provider, drink red wine in moderation. This means 1 glass a day for nonpregnant women and 2 glasses a day for men. A glass of wine equals 5 oz (150 mL). Reading food labels  Check the serving size of packaged foods. For foods such as rice and pasta, the serving size refers to the amount of cooked product, not dry. Check the total fat in packaged foods. Avoid foods that have saturated fat or trans fats. Check the ingredients list for added sugars, such as corn syrup.  Shopping At the grocery store, buy most of your food from the areas near the walls of the store. This includes: Fresh fruits and vegetables (produce). Grains, beans, nuts, and seeds. Some of these may be available in unpackaged forms or large amounts (in bulk). Fresh seafood. Poultry and eggs. Low-fat dairy products. Buy whole ingredients instead of prepackaged foods. Buy fresh fruits and vegetables in-season from local farmers markets. Buy frozen fruits and vegetables in resealable bags. If you do not have access to quality fresh seafood, buy precooked frozen shrimp or canned fish, such as tuna, salmon, or sardines. Buy small amounts  of raw or cooked vegetables, salads, or olives from the deli or salad bar at your store. Stock your pantry so you always have certain foods on hand, such as olive oil, canned tuna, canned tomatoes, rice, pasta, and beans. Cooking Cook foods with extra-virgin olive oil instead of using butter or other vegetable oils. Have meat as a side dish, and have vegetables or grains as your main dish. This means having meat in small portions or adding small amounts of meat to foods like pasta or stew. Use beans or vegetables instead of meat in common dishes like chili or lasagna. Experiment with different cooking methods. Try roasting or broiling vegetables instead of steaming or sauteing them. Add frozen vegetables to soups, stews, pasta, or rice. Add nuts or seeds for added healthy fat at each meal. You can add these to yogurt, salads, or vegetable dishes. Marinate fish or vegetables using olive oil, lemon juice, garlic, and fresh herbs. Meal planning  Plan to eat 1 vegetarian meal one day each week. Try to work up to 2 vegetarian meals, if possible. Eat seafood 2 or more times a week. Have healthy snacks readily available, such as: Vegetable sticks with hummus. Greek yogurt. Fruit and nut trail mix. Eat balanced meals throughout the week. This includes: Fruit: 2-3 servings a day Vegetables: 4-5 servings a day Low-fat dairy: 2 servings a day Fish, poultry, or lean meat: 1 serving a day Beans and legumes: 2 or more servings a week Nuts and seeds: 1-2 servings a day Whole grains: 6-8 servings a day Extra-virgin olive oil: 3-4 servings a day Limit red meat and sweets to only a few  servings a month  What are my food choices? Mediterranean diet Recommended Grains: Whole-grain pasta. Brown rice. Bulgar wheat. Polenta. Couscous. Whole-wheat bread. [place]. Vegetables: Artichokes. Beets. Broccoli. Cabbage. Carrots. Eggplant. Green beans. Chard. Kale. Spinach. Onions. Leeks. Peas. Squash.  Tomatoes. Peppers. Radishes. Fruits: Apples. Apricots. Avocado. Berries. Bananas. Cherries. Dates. Figs. Grapes. Lemons. Melon. Oranges. Peaches. Plums. Pomegranate. Meats and other protein foods: Beans. Almonds. Sunflower seeds. Pine nuts. Peanuts. [place]. Salmon. Scallops. Shrimp. [place]. Tilapia. Clams. Oysters. Eggs. Dairy: Low-fat milk. Cheese. Greek yogurt. Beverages: Water. Red wine. Herbal tea. Fats and oils: Extra virgin olive oil. Avocado oil. Grape seed oil. Sweets and desserts: [place] yogurt with honey. Baked apples. Poached pears. Trail mix. Seasoning and other foods: Basil. Cilantro. Coriander. Cumin. Mint. Parsley. Sage. Rosemary. Tarragon. Garlic. Oregano. Thyme. Pepper. Balsalmic vinegar. Tahini. Hummus. Tomato sauce. Olives. Mushrooms. Limit these Grains: Prepackaged pasta or rice dishes. Prepackaged cereal with added sugar. Vegetables: Deep fried potatoes (french fries). Fruits: Fruit canned in syrup. Meats and other protein foods: Beef. Pork. Lamb. Poultry with skin. Hot dogs. [place] Salines. Dairy: Ice cream. Sour cream. Whole milk. Beverages: Juice. Sugar-sweetened soft drinks. Beer. Liquor and spirits. Fats and oils: Butter. Canola oil. Vegetable oil. Beef fat (tallow). Lard. Sweets and desserts: Cookies. Cakes. Pies. Candy. Seasoning and other foods: Mayonnaise. Premade sauces and marinades. The items listed may not be a complete list. Talk with your dietitian aboutwhat dietary choices are right for you. Summary The Mediterranean diet includes both food and lifestyle choices. Eat a variety of fresh fruits and vegetables, beans, nuts, seeds, and whole grains. Limit the amount of red meat and sweets that you eat. Talk with your health care provider about whether it is safe for you to drink red wine in moderation. This means 1 glass a day for nonpregnant women and 2 glasses a day for men. A glass of wine equals 5 oz (150 mL). This information is not intended to replace advice given to  you by your health care provider. Make sure you discuss any questions you have with your healthcare provider. Document Revised: 11/01/2015 Document Reviewed: 10/25/2015 Elsevier Patient Education  [place].

## 2020-12-03 ENCOUNTER — Telehealth: Payer: Self-pay | Admitting: Family Medicine

## 2020-12-03 DIAGNOSIS — E7849 Other hyperlipidemia: Secondary | ICD-10-CM

## 2020-12-03 DIAGNOSIS — Z79899 Other long term (current) drug therapy: Secondary | ICD-10-CM

## 2020-12-03 DIAGNOSIS — Z125 Encounter for screening for malignant neoplasm of prostate: Secondary | ICD-10-CM

## 2020-12-03 NOTE — Telephone Encounter (Signed)
He is due for a lipid, liver, metabolic 7, PSA

## 2020-12-03 NOTE — Telephone Encounter (Signed)
Last labs 06/13/20: Lipid, Liver, Hep C

## 2020-12-03 NOTE — Telephone Encounter (Signed)
Patient is scheduled for physical on 10/12 and need labs before appt.   CB# [phone]

## 2020-12-04 NOTE — Telephone Encounter (Signed)
Blood work ordered in Epic. Patient notified. 

## 2020-12-21 DIAGNOSIS — Z125 Encounter for screening for malignant neoplasm of prostate: Secondary | ICD-10-CM | POA: Diagnosis not present

## 2020-12-21 DIAGNOSIS — E7849 Other hyperlipidemia: Secondary | ICD-10-CM | POA: Diagnosis not present

## 2020-12-21 DIAGNOSIS — Z79899 Other long term (current) drug therapy: Secondary | ICD-10-CM | POA: Diagnosis not present

## 2020-12-22 LAB — BASIC METABOLIC PANEL
BUN/Creatinine Ratio: 8 — ABNORMAL LOW (ref 10–24)
BUN: 7 mg/dL — ABNORMAL LOW (ref 8–27)
CO2: 24 mmol/L (ref 20–29)
Calcium: 8.9 mg/dL (ref 8.6–10.2)
Chloride: 102 mmol/L (ref 96–106)
Creatinine, Ser: 0.87 mg/dL (ref 0.76–1.27)
Glucose: 173 mg/dL — ABNORMAL HIGH (ref 70–99)
Potassium: 3.7 mmol/L (ref 3.5–5.2)
Sodium: 141 mmol/L (ref 134–144)
eGFR: 94 mL/min/{1.73_m2} (ref >59)

## 2020-12-22 LAB — HEPATIC FUNCTION PANEL
ALT: 29 IU/L (ref 0–44)
AST: 22 IU/L (ref 0–40)
Albumin: 4.2 g/dL (ref 3.8–4.8)
Alkaline Phosphatase: 92 IU/L (ref 44–121)
Bilirubin Total: 0.3 mg/dL (ref 0.0–1.2)
Bilirubin, Direct: <0.1 mg/dL (ref 0.00–0.40)
Total Protein: 6.7 g/dL (ref 6.0–8.5)

## 2020-12-22 LAB — LIPID PANEL
Chol/HDL Ratio: 4.5 ratio (ref 0.0–5.0)
Cholesterol, Total: 174 mg/dL (ref 100–199)
HDL: 39 mg/dL — ABNORMAL LOW (ref >39)
LDL Chol Calc (NIH): 115 mg/dL — ABNORMAL HIGH (ref 0–99)
Triglycerides: 108 mg/dL (ref 0–149)
VLDL Cholesterol Cal: 20 mg/dL (ref 5–40)

## 2020-12-22 LAB — PSA: Prostate Specific Ag, Serum: 0.5 ng/mL (ref 0.0–4.0)

## 2020-12-26 ENCOUNTER — Other Ambulatory Visit: Payer: Self-pay

## 2020-12-26 ENCOUNTER — Encounter: Payer: Self-pay | Admitting: Family Medicine

## 2020-12-26 ENCOUNTER — Ambulatory Visit (INDEPENDENT_AMBULATORY_CARE_PROVIDER_SITE_OTHER): Payer: BC Managed Care – PPO | Admitting: Family Medicine

## 2020-12-26 VITALS — BP 132/68 | Temp 98.2°F | Ht 63.75 in | Wt 165.2 lb

## 2020-12-26 DIAGNOSIS — R739 Hyperglycemia, unspecified: Secondary | ICD-10-CM

## 2020-12-26 DIAGNOSIS — I6529 Occlusion and stenosis of unspecified carotid artery: Secondary | ICD-10-CM

## 2020-12-26 DIAGNOSIS — Z136 Encounter for screening for cardiovascular disorders: Secondary | ICD-10-CM

## 2020-12-26 DIAGNOSIS — Z0001 Encounter for general adult medical examination with abnormal findings: Secondary | ICD-10-CM | POA: Diagnosis not present

## 2020-12-26 DIAGNOSIS — Z23 Encounter for immunization: Secondary | ICD-10-CM | POA: Diagnosis not present

## 2020-12-26 DIAGNOSIS — Z0000 Encounter for general adult medical examination without abnormal findings: Secondary | ICD-10-CM

## 2020-12-26 DIAGNOSIS — E7849 Other hyperlipidemia: Secondary | ICD-10-CM | POA: Diagnosis not present

## 2020-12-26 MED ORDER — ROSUVASTATIN CALCIUM 20 MG PO TABS: ORAL_TABLET | ORAL | 6 refills | Status: DC

## 2020-12-26 NOTE — Patient Instructions (Signed)

## 2020-12-26 NOTE — Progress Notes (Signed)
   Subjective:    Patient ID: Ronnie Walker, male    DOB: 11-03-1950, 70 y.o.   MRN: [id number]  HPI AWV- Annual Wellness Visit  The patient was seen for their annual wellness visit. The patient's past medical history, surgical history, and family history were reviewed. Pertinent vaccines were reviewed ( tetanus, pneumonia, shingles, flu) The patient's medication list was reviewed and updated.  The height and weight were entered.  BMI recorded in electronic record elsewhere  Cognitive screening was completed. Outcome of Mini - Cog: pass   Falls /depression screening electronically recorded within record elsewhere  Current tobacco usage:none (All patients who use tobacco were given written and verbal information on quitting)  Recent listing of emergency department/hospitalizations over the past year were reviewed.  current specialist the patient sees on a regular basis: none   Medicare annual wellness visit patient questionnaire was reviewed.  A written screening schedule for the patient for the next 5-10 years was given. Appropriate discussion of followup regarding next visit was discussed.      Review of Systems     Objective:   Physical Exam  General-in no acute distress Eyes-no discharge Lungs-respiratory rate normal, CTA CV-no murmurs,RRR Extremities skin warm dry no edema Neuro grossly normal Behavior normal, alert Prostate exam normal      Assessment & Plan:  1. Well adult exam Adult wellness-complete.wellness physical was conducted today. Importance of diet and exercise were discussed in detail.  In addition to this a discussion regarding safety was also covered. We also reviewed over immunizations and gave recommendations regarding current immunization needed for age.  In addition to this additional areas were also touched on including: Preventative health exams needed:  Colonoscopy 2027  Patient was advised yearly wellness exam  - Hemoglobin  A1c - Glucose - Lipid Profile  2. Other hyperlipidemia Patient states he will work harder at dietary measures he also ran out of his medicines so hopefully the numbers will look better on follow-up he will do the lab work again in 8 weeks - Hemoglobin A1c - Glucose - Lipid Profile  3. Hyperglycemia He admits to drinking sodas and having a lot of snacks he will work hard at trying to cut back on this and try to get his numbers down he will recheck an A1c and glucose in 8 weeks - Hemoglobin A1c - Glucose - Lipid Profile  4. Screening for AAA (abdominal aortic aneurysm) History of smoking he quit somewhere around the year 2000 but nonetheless he is at risk of a aneurysm he also states he has a family history of aortic aneurysm - US AORTA  5. Carotid atherosclerosis, unspecified laterality Patient has history of stroke along with carotid artery disease needs a follow-up ultrasound - US Carotid Bilateral  6. Need for vaccination Flu shot today - Flu Vaccine QUAD High Dose(Fluad)  If all goes well he will do a follow-up visit in 4 to 6 months

## 2021-01-01 NOTE — Addendum Note (Signed)
Addended by: Vicente Males on: 01/01/2021 04:29 PM   Modules accepted: Orders

## 2021-01-08 NOTE — Addendum Note (Signed)
Addended by: Vicente Males on: 01/08/2021 01:55 PM   Modules accepted: Orders

## 2021-01-28 ENCOUNTER — Other Ambulatory Visit: Payer: Self-pay

## 2021-01-28 ENCOUNTER — Ambulatory Visit (HOSPITAL_COMMUNITY)
Admission: RE | Admit: 2021-01-28 | Discharge: 2021-01-28 | Disposition: A | Discharge status: Home / Self Care | Payer: BC Managed Care – PPO | Source: Ambulatory Visit | Attending: Family Medicine | Admitting: Family Medicine

## 2021-01-28 DIAGNOSIS — I6523 Occlusion and stenosis of bilateral carotid arteries: Secondary | ICD-10-CM | POA: Diagnosis not present

## 2021-01-28 DIAGNOSIS — K7689 Other specified diseases of liver: Secondary | ICD-10-CM | POA: Diagnosis not present

## 2021-01-28 DIAGNOSIS — I6529 Occlusion and stenosis of unspecified carotid artery: Secondary | ICD-10-CM | POA: Diagnosis not present

## 2021-01-28 DIAGNOSIS — Z87891 Personal history of nicotine dependence: Secondary | ICD-10-CM | POA: Diagnosis not present

## 2021-01-28 DIAGNOSIS — Z136 Encounter for screening for cardiovascular disorders: Secondary | ICD-10-CM | POA: Diagnosis not present

## 2021-01-28 DIAGNOSIS — I77811 Abdominal aortic ectasia: Secondary | ICD-10-CM | POA: Diagnosis not present

## 2021-03-29 ENCOUNTER — Other Ambulatory Visit: Payer: Self-pay

## 2021-03-29 ENCOUNTER — Encounter: Payer: Self-pay | Admitting: Family Medicine

## 2021-03-29 ENCOUNTER — Ambulatory Visit (INDEPENDENT_AMBULATORY_CARE_PROVIDER_SITE_OTHER): Payer: BC Managed Care – PPO | Admitting: Family Medicine

## 2021-03-29 VITALS — BP 156/91 | HR 75 | Temp 98.3°F | Ht 63.75 in | Wt 166.0 lb

## 2021-03-29 DIAGNOSIS — J22 Unspecified acute lower respiratory infection: Secondary | ICD-10-CM | POA: Diagnosis not present

## 2021-03-29 MED ORDER — DOXYCYCLINE HYCLATE 100 MG PO TABS: 100.0000 mg | ORAL_TABLET | Freq: Two times a day (BID) | ORAL | 0 refills | Status: DC

## 2021-03-29 NOTE — Progress Notes (Signed)
Subjective:  Patient ID: Ronnie Walker, male    DOB: Jan 14, 1951  Age: 71 y.o. MRN: [id number]  CC: Chief Complaint  Patient presents with   Cough    Congestion x 1 week , took robitussin     HPI:  71 year old male presents with respiratory symptoms.  Patient reports a 1.5-week history of cough which is productive.  He also reports sinus pressure and congestion particularly in the maxillary region.  No documented fever.  He had a slightly elevated temperature today of 99.1.  No reported sick contacts.  He has not taken any COVID testing at home.  He has taken Robitussin without relief.  No other associated symptoms.  No other complaints.  Patient Active Problem List   Diagnosis Date Noted   LRTI (lower respiratory tract infection) 03/29/2021   Preventative health care 02/14/2020   Encounter for screening colonoscopy 02/14/2020   Elevated blood pressure reading    Gastroesophageal reflux disease    Acute ischemic stroke ([place]) 08/23/2019   Hyperlipidemia 02/16/2013   Hyperglycemia 02/16/2013    Social Hx   Social History   Socioeconomic History   Marital status: Married    Spouse name: Not on file   Number of children: 2   Years of education: Not on file   Highest education level: Not on file  Occupational History   Occupation: Dealer  Tobacco Use   Smoking status: Former   Smokeless tobacco: Former    Quit date: 02/05/2003  Vaping Use   Vaping Use: Never used  Substance and Sexual Activity   Alcohol use: Yes    Comment: 2-3 beer/day   Drug use: Never   Sexual activity: Not on file  Other Topics Concern   Not on file  Social History Narrative   Not on file   Social Determinants of Health   Financial Resource Strain: Not on file  Food Insecurity: Not on file  Transportation Needs: Not on file  Physical Activity: Not on file  Stress: Not on file  Social Connections: Not on file    Review of Systems Per HPI  Objective:  BP (!) 156/91    Pulse 75     Temp 98.3 F (36.8 C)    Ht 5' 3.75" (1.619 m)    Wt 166 lb (75.3 kg)    SpO2 97%    BMI 28.72 kg/m   BP/Weight 03/29/2021 12/26/2020 8/56/3149  Systolic BP 702 637 858  Diastolic BP 91 68 89  Wt. (Lbs) 166 165.2 167.2  BMI 28.72 28.58 26.99    Physical Exam Vitals and nursing note reviewed.  Constitutional:      General: He is not in acute distress.    Appearance: Normal appearance. He is not ill-appearing.  HENT:     Head: Normocephalic and atraumatic.     Mouth/Throat:     Pharynx: Oropharynx is clear.  Cardiovascular:     Rate and Rhythm: Normal rate and regular rhythm.     Heart sounds: No murmur heard. Pulmonary:     Effort: Pulmonary effort is normal.     Breath sounds: Normal breath sounds. No wheezing, rhonchi or rales.  Neurological:     Mental Status: He is alert.  Psychiatric:        Mood and Affect: Mood normal.        Behavior: Behavior normal.    Lab Results  Component Value Date   WBC 5.9 08/24/2019   HGB 14.3 08/24/2019   HCT 42.2 08/24/2019  PLT 195 08/24/2019   GLUCOSE 173 (H) 12/21/2020   CHOL 174 12/21/2020   TRIG 108 12/21/2020   HDL 39 (L) 12/21/2020   LDLCALC 115 (H) 12/21/2020   ALT 29 12/21/2020   AST 22 12/21/2020   NA 141 12/21/2020   K 3.7 12/21/2020   CL 102 12/21/2020   CREATININE 0.87 12/21/2020   BUN 7 (L) 12/21/2020   CO2 24 12/21/2020   PSA 0.44 01/24/2013   INR 1.0 08/23/2019   HGBA1C 6.2 (H) 08/23/2019     Assessment & Plan:   Problem List Items Addressed This Visit       Respiratory   LRTI (lower respiratory tract infection) - Primary    Patient having upper and lower tract symptoms.  Predominance of productive cough.  He has been sick for the past 1.5 weeks.  Given duration of illness and lack of improvement, placing on doxycycline.       Meds ordered this encounter  Medications   doxycycline (VIBRA-TABS) 100 MG tablet    Sig: Take 1 tablet (100 mg total) by mouth 2 (two) times daily.    Dispense:  14  tablet    Refill:  [facility]

## 2021-03-29 NOTE — Patient Instructions (Signed)
Antibiotic as prescribed.  Call with concerns.  Take care  Dr. Tegh Franek  

## 2021-03-29 NOTE — Assessment & Plan Note (Signed)
Patient having upper and lower tract symptoms.  Predominance of productive cough.  He has been sick for the past 1.5 weeks.  Given duration of illness and lack of improvement, placing on doxycycline.

## 2021-06-13 DIAGNOSIS — H01002 Unspecified blepharitis right lower eyelid: Secondary | ICD-10-CM | POA: Diagnosis not present

## 2021-06-13 DIAGNOSIS — H01001 Unspecified blepharitis right upper eyelid: Secondary | ICD-10-CM | POA: Diagnosis not present

## 2021-06-13 DIAGNOSIS — H401121 Primary open-angle glaucoma, left eye, mild stage: Secondary | ICD-10-CM | POA: Diagnosis not present

## 2021-06-13 DIAGNOSIS — H01004 Unspecified blepharitis left upper eyelid: Secondary | ICD-10-CM | POA: Diagnosis not present

## 2021-06-26 ENCOUNTER — Ambulatory Visit (INDEPENDENT_AMBULATORY_CARE_PROVIDER_SITE_OTHER): Payer: BC Managed Care – PPO | Admitting: Family Medicine

## 2021-06-26 VITALS — BP 118/80 | Ht 63.75 in | Wt 166.7 lb

## 2021-06-26 DIAGNOSIS — E7849 Other hyperlipidemia: Secondary | ICD-10-CM

## 2021-06-26 DIAGNOSIS — R739 Hyperglycemia, unspecified: Secondary | ICD-10-CM

## 2021-06-26 DIAGNOSIS — Z79899 Other long term (current) drug therapy: Secondary | ICD-10-CM

## 2021-06-26 MED ORDER — ROSUVASTATIN CALCIUM 20 MG PO TABS: ORAL_TABLET | ORAL | 6 refills | Status: DC

## 2021-06-26 NOTE — Progress Notes (Signed)
? ?  Subjective:  ? ? Patient ID: Ronnie Walker, male    DOB: 1950/05/03, 71 y.o.   MRN: [id number] ? ?Hyperlipidemia ?This is a chronic problem. The current episode started more than 1 year ago. Treatments tried: rosuvastatin. There are no compliance problems.  Risk factors for coronary artery disease include dyslipidemia.  ? ?Patient overall doing well taking his medication.  Staying active.  Doing a lot of fishing also working.  Denies any chest tightness pressure pain shortness of breath ? ?Review of Systems ? ?   ?Objective:  ? Physical Exam ? ?General-in no acute distress ?Eyes-no discharge ?Lungs-respiratory rate normal, CTA ?CV-no murmurs,RRR ?Extremities skin warm dry no edema ?Neuro grossly normal ?Behavior normal, alert ? ? ? ?   ?Assessment & Plan:  ?Overall doing well ?Blood pressure good ?Continue cholesterol medicine ?Continue 81 mg aspirin ?Patient to do comprehensive blood work he did not do previously.  Need to do this because of hyperglycemia to rule out diabetes.  Follow-up by fall time for wellness ? ?

## 2021-07-11 DIAGNOSIS — H01001 Unspecified blepharitis right upper eyelid: Secondary | ICD-10-CM | POA: Diagnosis not present

## 2021-07-11 DIAGNOSIS — H01002 Unspecified blepharitis right lower eyelid: Secondary | ICD-10-CM | POA: Diagnosis not present

## 2021-07-11 DIAGNOSIS — H401121 Primary open-angle glaucoma, left eye, mild stage: Secondary | ICD-10-CM | POA: Diagnosis not present

## 2021-07-11 DIAGNOSIS — H01004 Unspecified blepharitis left upper eyelid: Secondary | ICD-10-CM | POA: Diagnosis not present

## 2021-11-14 ENCOUNTER — Telehealth: Payer: Self-pay | Admitting: *Deleted

## 2021-11-14 NOTE — Patient Outreach (Signed)
  Care Coordination   Initial Visit Note   11/14/2021 Name: Ronnie Walker MRN: [id number] DOB: 05/29/1950  Ronnie Walker is a 71 y.o. year old male who sees Luking, Elayne Snare, MD for primary care. I spoke with  Ronnie Walker by phone today.  What matters to the patients health and wellness today?  No concerns expressed. Annual wellness visit completed. RN discussed services [facility] services, RN, SW, and Pharmacist. Patient declined services.    Goals Addressed             This Visit's Progress    Advised patient to contact PCP  to schedule vaccines          SDOH assessments and interventions completed:  Yes     Care Coordination Interventions Activated:  Yes  Care Coordination Interventions:  Yes, provided   Follow up plan: No further intervention required.   Encounter Outcome:  Pt. [place] [facility] [phone]

## 2021-12-12 DIAGNOSIS — H01004 Unspecified blepharitis left upper eyelid: Secondary | ICD-10-CM | POA: Diagnosis not present

## 2021-12-12 DIAGNOSIS — H401121 Primary open-angle glaucoma, left eye, mild stage: Secondary | ICD-10-CM | POA: Diagnosis not present

## 2021-12-12 DIAGNOSIS — H01002 Unspecified blepharitis right lower eyelid: Secondary | ICD-10-CM | POA: Diagnosis not present

## 2021-12-12 DIAGNOSIS — H01001 Unspecified blepharitis right upper eyelid: Secondary | ICD-10-CM | POA: Diagnosis not present

## 2021-12-31 ENCOUNTER — Ambulatory Visit (INDEPENDENT_AMBULATORY_CARE_PROVIDER_SITE_OTHER): Payer: BC Managed Care – PPO | Admitting: Family Medicine

## 2021-12-31 VITALS — BP 118/74 | HR 55 | Temp 97.5°F | Ht 63.75 in | Wt 162.0 lb

## 2021-12-31 DIAGNOSIS — E7849 Other hyperlipidemia: Secondary | ICD-10-CM

## 2021-12-31 DIAGNOSIS — Z0000 Encounter for general adult medical examination without abnormal findings: Secondary | ICD-10-CM | POA: Diagnosis not present

## 2021-12-31 DIAGNOSIS — Z23 Encounter for immunization: Secondary | ICD-10-CM | POA: Diagnosis not present

## 2021-12-31 DIAGNOSIS — Z125 Encounter for screening for malignant neoplasm of prostate: Secondary | ICD-10-CM

## 2021-12-31 DIAGNOSIS — R739 Hyperglycemia, unspecified: Secondary | ICD-10-CM

## 2021-12-31 DIAGNOSIS — Z79899 Other long term (current) drug therapy: Secondary | ICD-10-CM

## 2021-12-31 MED ORDER — ROSUVASTATIN CALCIUM 20 MG PO TABS
ORAL_TABLET | ORAL | 6 refills | Status: DC
Start: 2021-12-31 — End: 2022-08-14

## 2021-12-31 NOTE — Progress Notes (Signed)
   Subjective:    Patient ID: Ronnie Walker, male    DOB: 1950-12-11, 71 y.o.   MRN: [id number]  HPI AWV- Annual Wellness Visit  The patient was seen for their annual wellness visit. The patient's past medical history, surgical history, and family history were reviewed. Pertinent vaccines were reviewed ( tetanus, pneumonia, shingles, flu) The patient's medication list was reviewed and updated.  The height and weight were entered.  BMI recorded in electronic record elsewhere  Cognitive screening was completed. Outcome of Mini - Cog: 5   Falls /depression screening electronically recorded within record elsewhere  Current tobacco usage:no (All patients who use tobacco were given written and verbal information on quitting)  Recent listing of emergency department/hospitalizations over the past year were reviewed.  current specialist the patient sees on a regular basis: eye doctor for glaucoma    Medicare annual wellness visit patient questionnaire was reviewed.  A written screening schedule for the patient for the next 5-10 years was given. Appropriate discussion of followup regarding next visit was discussed.      Review of Systems     Objective:   Physical Exam General-in no acute distress Eyes-no discharge Lungs-respiratory rate normal, CTA CV-no murmurs,RRR Extremities skin warm dry no edema Neuro grossly normal Behavior normal, alert P.o. prostate exam normal       Assessment & Plan:  1. Well adult exam Adult wellness-complete.wellness physical was conducted today. Importance of diet and exercise were discussed in detail.  Importance of stress reduction and healthy living were discussed.  In addition to this a discussion regarding safety was also covered.  We also reviewed over immunizations and gave recommendations regarding current immunization needed for age.   In addition to this additional areas were also touched on including: Preventative health exams  needed:  Colonoscopy 2027  Patient was advised yearly wellness exam  - PSA - Lipid panel - Basic metabolic panel - Hepatic Function Panel  2. Flu vaccine need Today - Flu Vaccine QUAD High Dose(Fluad)  3. Other hyperlipidemia Continue rosuvastatin continue healthy diet - PSA - Lipid panel - Basic metabolic panel - Hepatic Function Panel  4. Screening PSA (prostate specific antigen) Screening today - PSA - Lipid panel - Basic metabolic panel - Hepatic Function Panel  5. Immunization due Today - Pneumococcal conjugate vaccine 20-valent (Prevnar 20)  Patient had slight enlargement aorta he has a follow-up ultrasound to be completed in 2027

## 2021-12-31 NOTE — Patient Instructions (Signed)

## 2022-01-03 DIAGNOSIS — Z125 Encounter for screening for malignant neoplasm of prostate: Secondary | ICD-10-CM | POA: Diagnosis not present

## 2022-01-03 DIAGNOSIS — E7849 Other hyperlipidemia: Secondary | ICD-10-CM | POA: Diagnosis not present

## 2022-01-03 DIAGNOSIS — Z0000 Encounter for general adult medical examination without abnormal findings: Secondary | ICD-10-CM | POA: Diagnosis not present

## 2022-01-04 LAB — LIPID PANEL
Chol/HDL Ratio: 3.6 ratio (ref 0.0–5.0)
Cholesterol, Total: 198 mg/dL (ref 100–199)
HDL: 55 mg/dL (ref >39)
LDL Chol Calc (NIH): 118 mg/dL — ABNORMAL HIGH (ref 0–99)
Triglycerides: 144 mg/dL (ref 0–149)
VLDL Cholesterol Cal: 25 mg/dL (ref 5–40)

## 2022-01-04 LAB — HEPATIC FUNCTION PANEL
ALT: 21 IU/L (ref 0–44)
AST: 15 IU/L (ref 0–40)
Albumin: 4.5 g/dL (ref 3.9–4.9)
Alkaline Phosphatase: 92 IU/L (ref 44–121)
Bilirubin Total: 0.3 mg/dL (ref 0.0–1.2)
Bilirubin, Direct: <0.1 mg/dL (ref 0.00–0.40)
Total Protein: 6.9 g/dL (ref 6.0–8.5)

## 2022-01-04 LAB — BASIC METABOLIC PANEL
BUN/Creatinine Ratio: 12 (ref 10–24)
BUN: 11 mg/dL (ref 8–27)
CO2: 25 mmol/L (ref 20–29)
Calcium: 9.4 mg/dL (ref 8.6–10.2)
Chloride: 101 mmol/L (ref 96–106)
Creatinine, Ser: 0.93 mg/dL (ref 0.76–1.27)
Glucose: 135 mg/dL — ABNORMAL HIGH (ref 70–99)
Potassium: 4.8 mmol/L (ref 3.5–5.2)
Sodium: 139 mmol/L (ref 134–144)
eGFR: 89 mL/min/{1.73_m2} (ref >59)

## 2022-01-04 LAB — PSA: Prostate Specific Ag, Serum: 0.4 ng/mL (ref 0.0–4.0)

## 2022-01-13 MED ORDER — EZETIMIBE 10 MG PO TABS: 10.0000 mg | ORAL_TABLET | Freq: Every day | ORAL | 5 refills | Status: DC

## 2022-01-13 NOTE — Addendum Note (Signed)
Addended by: Dairl Ponder on: 01/13/2022 02:41 PM   Modules accepted: Orders

## 2022-03-03 DIAGNOSIS — H01004 Unspecified blepharitis left upper eyelid: Secondary | ICD-10-CM | POA: Diagnosis not present

## 2022-03-03 DIAGNOSIS — H401121 Primary open-angle glaucoma, left eye, mild stage: Secondary | ICD-10-CM | POA: Diagnosis not present

## 2022-03-03 DIAGNOSIS — H01002 Unspecified blepharitis right lower eyelid: Secondary | ICD-10-CM | POA: Diagnosis not present

## 2022-03-03 DIAGNOSIS — H01001 Unspecified blepharitis right upper eyelid: Secondary | ICD-10-CM | POA: Diagnosis not present

## 2022-06-02 DIAGNOSIS — H01002 Unspecified blepharitis right lower eyelid: Secondary | ICD-10-CM | POA: Diagnosis not present

## 2022-06-02 DIAGNOSIS — H01001 Unspecified blepharitis right upper eyelid: Secondary | ICD-10-CM | POA: Diagnosis not present

## 2022-06-02 DIAGNOSIS — H401121 Primary open-angle glaucoma, left eye, mild stage: Secondary | ICD-10-CM | POA: Diagnosis not present

## 2022-06-02 DIAGNOSIS — H01004 Unspecified blepharitis left upper eyelid: Secondary | ICD-10-CM | POA: Diagnosis not present

## 2022-06-30 ENCOUNTER — Ambulatory Visit: Payer: BC Managed Care – PPO | Admitting: Family Medicine

## 2022-07-01 DIAGNOSIS — E7849 Other hyperlipidemia: Secondary | ICD-10-CM | POA: Diagnosis not present

## 2022-07-01 DIAGNOSIS — R739 Hyperglycemia, unspecified: Secondary | ICD-10-CM | POA: Diagnosis not present

## 2022-07-01 DIAGNOSIS — Z79899 Other long term (current) drug therapy: Secondary | ICD-10-CM | POA: Diagnosis not present

## 2022-07-02 ENCOUNTER — Ambulatory Visit: Payer: BC Managed Care – PPO | Admitting: Family Medicine

## 2022-07-02 LAB — HEPATIC FUNCTION PANEL
ALT: 24 IU/L (ref 0–44)
AST: 21 IU/L (ref 0–40)
Albumin: 4.5 g/dL (ref 3.9–4.9)
Alkaline Phosphatase: 94 IU/L (ref 44–121)
Bilirubin Total: 0.4 mg/dL (ref 0.0–1.2)
Bilirubin, Direct: 0.12 mg/dL (ref 0.00–0.40)
Total Protein: 7 g/dL (ref 6.0–8.5)

## 2022-07-02 LAB — HEMOGLOBIN A1C
Est. average glucose Bld gHb Est-mCnc: 146 mg/dL
Hgb A1c MFr Bld: 6.7 % — ABNORMAL HIGH (ref 4.8–5.6)

## 2022-07-02 LAB — LIPID PANEL
Chol/HDL Ratio: 3.5 ratio (ref 0.0–5.0)
Cholesterol, Total: 188 mg/dL (ref 100–199)
HDL: 53 mg/dL (ref >39)
LDL Chol Calc (NIH): 110 mg/dL — ABNORMAL HIGH (ref 0–99)
Triglycerides: 139 mg/dL (ref 0–149)
VLDL Cholesterol Cal: 25 mg/dL (ref 5–40)

## 2022-07-09 ENCOUNTER — Ambulatory Visit: Payer: BC Managed Care – PPO | Admitting: Family Medicine

## 2022-07-09 VITALS — BP 128/72 | HR 63 | Ht 63.75 in | Wt 163.6 lb

## 2022-07-09 DIAGNOSIS — E119 Type 2 diabetes mellitus without complications: Secondary | ICD-10-CM | POA: Diagnosis not present

## 2022-07-09 DIAGNOSIS — E7849 Other hyperlipidemia: Secondary | ICD-10-CM

## 2022-07-09 NOTE — Patient Instructions (Addendum)
Please do your lab at the start of August  Diabetes Mellitus and Nutrition, Adult When you have diabetes, or diabetes mellitus, it is very important to have healthy eating habits because your blood sugar (glucose) levels are greatly affected by what you eat and drink. Eating healthy foods in the right amounts, at about the same times every day, can help you: Manage your blood glucose. Lower your risk of heart disease. Improve your blood pressure. Reach or maintain a healthy weight. What can affect my meal plan? Every person with diabetes is different, and each person has different needs for a meal plan. Your health care provider may recommend that you work with a dietitian to make a meal plan that is best for you. Your meal plan may vary depending on factors such as: The calories you need. The medicines you take. Your weight. Your blood glucose, blood pressure, and cholesterol levels. Your activity level. Other health conditions you have, such as heart or kidney disease. How do carbohydrates affect me? Carbohydrates, also called carbs, affect your blood glucose level more than any other type of food. Eating carbs raises the amount of glucose in your blood. It is important to know how many carbs you can safely have in each meal. This is different for every person. Your dietitian can help you calculate how many carbs you should have at each meal and for each snack. How does alcohol affect me? Alcohol can cause a decrease in blood glucose (hypoglycemia), especially if you use insulin or take certain diabetes medicines by mouth. Hypoglycemia can be a life-threatening condition. Symptoms of hypoglycemia, such as sleepiness, dizziness, and confusion, are similar to symptoms of having too much alcohol. Do not drink alcohol if: Your health care provider tells you not to drink. You are pregnant, may be pregnant, or are planning to become pregnant. If you drink alcohol: Limit how much you have  to: 0-1 drink a day for women. 0-2 drinks a day for men. Know how much alcohol is in your drink. In the [place] one drink equals one 12 oz bottle of beer (355 mL), one 5 oz glass of wine (148 mL), or one 1 oz glass of hard liquor (44 mL). Keep yourself hydrated with water, diet soda, or unsweetened iced tea. Keep in mind that regular soda, juice, and other mixers may contain a lot of sugar and must be counted as carbs. What are tips for following this plan?  Reading food labels Start by checking the serving size on the Nutrition Facts label of packaged foods and drinks. The number of calories and the amount of carbs, fats, and other nutrients listed on the label are based on one serving of the item. Many items contain more than one serving per package. Check the total grams (g) of carbs in one serving. Check the number of grams of saturated fats and trans fats in one serving. Choose foods that have a low amount or none of these fats. Check the number of milligrams (mg) of salt (sodium) in one serving. Most people should limit total sodium intake to less than 2,300 mg per day. Always check the nutrition information of foods labeled as "low-fat" or "nonfat." These foods may be higher in added sugar or refined carbs and should be avoided. Talk to your dietitian to identify your daily goals for nutrients listed on the label. Shopping Avoid buying canned, pre-made, or processed foods. These foods tend to be high in fat, sodium, and added sugar. Shop around the  outside edge of the grocery store. This is where you will most often find fresh fruits and vegetables, bulk grains, fresh meats, and fresh dairy products. Cooking Use low-heat cooking methods, such as baking, instead of high-heat cooking methods, such as deep frying. Cook using healthy oils, such as olive, canola, or sunflower oil. Avoid cooking with butter, cream, or high-fat meats. Meal planning Eat meals and snacks regularly, preferably at  the same times every day. Avoid going long periods of time without eating. Eat foods that are high in fiber, such as fresh fruits, vegetables, beans, and whole grains. Eat 4-6 oz (112-168 g) of lean protein each day, such as lean meat, chicken, fish, eggs, or tofu. One ounce (oz) (28 g) of lean protein is equal to: 1 oz (28 g) of meat, chicken, or fish. 1 egg.  cup (62 g) of tofu. Eat some foods each day that contain healthy fats, such as avocado, nuts, seeds, and fish. What foods should I eat? Fruits Berries. Apples. Oranges. Peaches. Apricots. Plums. Grapes. Mangoes. Papayas. Pomegranates. Kiwi. Cherries. Vegetables Leafy greens, including lettuce, spinach, kale, chard, collard greens, mustard greens, and cabbage. Beets. Cauliflower. Broccoli. Carrots. Green beans. Tomatoes. Peppers. Onions. Cucumbers. Brussels sprouts. Grains Whole grains, such as whole-wheat or whole-grain bread, crackers, tortillas, cereal, and pasta. Unsweetened oatmeal. Quinoa. Brown or wild rice. Meats and other proteins Seafood. Poultry without skin. Lean cuts of poultry and beef. Tofu. Nuts. Seeds. Dairy Low-fat or fat-free dairy products such as milk, yogurt, and cheese. The items listed above may not be a complete list of foods and beverages you can eat and drink. Contact a dietitian for more information. What foods should I avoid? Fruits Fruits canned with syrup. Vegetables Canned vegetables. Frozen vegetables with butter or cream sauce. Grains Refined white flour and flour products such as bread, pasta, snack foods, and cereals. Avoid all processed foods. Meats and other proteins Fatty cuts of meat. Poultry with skin. Breaded or fried meats. Processed meat. Avoid saturated fats. Dairy Full-fat yogurt, cheese, or milk. Beverages Sweetened drinks, such as soda or iced tea. The items listed above may not be a complete list of foods and beverages you should avoid. Contact a dietitian for more  information. Questions to ask a health care provider Do I need to meet with a certified diabetes care and education specialist? Do I need to meet with a dietitian? What number can I call if I have questions? When are the best times to check my blood glucose? Where to find more information: American Diabetes Association: diabetes.org Academy of Nutrition and Dietetics: eatright.[employer] of Diabetes and Digestive and Kidney Diseases: [email or web address].si Association of Diabetes Care & Education Specialists: diabeteseducator.org Summary It is important to have healthy eating habits because your blood sugar (glucose) levels are greatly affected by what you eat and drink. It is important to use alcohol carefully. A healthy meal plan will help you manage your blood glucose and lower your risk of heart disease. Your health care provider may recommend that you work with a dietitian to make a meal plan that is best for you. This information is not intended to replace advice given to you by your health care provider. Make sure you discuss any questions you have with your health care provider. Document Revised: 10/05/2019 Document Reviewed: 10/05/2019 Elsevier Patient Education  2023 Elsevier Inc. Shingrix and shingles prevention: know the facts!   Shingrix is a very effective vaccine to prevent shingles.   Shingles is a reactivation of chickenpox -  more than 99% of Americans born before 1980 have had chickenpox even if they do not remember it. One in every 10 people who get shingles have severe long-lasting nerve pain as a result.   33 out of a 100 older adults will get shingles if they are unvaccinated.     This vaccine is very important for your health This vaccine is indicated for anyone 50 years or older. You can get this vaccine even if you have already had shingles because you can get the disease more than once in a lifetime.  Your risk for shingles and its complications increases  with age.  This vaccine has 2 doses.  The second dose would be 2 to 6 months after the first dose.  If you had Zostavax vaccine in the past you should still get Shingrix. ( Zostavax is only 70% effective and it loses significant strength over a few years .)  This vaccine is given through the pharmacy.  The cost of the vaccine is through your insurance. The pharmacy can inform you of the total costs.  Common side effects including soreness in the arm, some redness and swelling, also some feel fatigue muscle soreness headache low-grade fever.  Side effects typically go away within 2 to 3 days. Remember-the pain from shingles can last a lifetime but these side effects of the vaccine will only last a few days at most. It is very important to get both doses in order to protect yourself fully.   Please get this vaccine at your earliest convenience at your trusted pharmacy.

## 2022-07-09 NOTE — Progress Notes (Signed)
Subjective:    Patient ID: Ronnie Walker, male    DOB: [birth year], 72 y.o.   MRN: [id number]  HPI Patient arrives today for 6 month follow up. Patient states concerns about A1C. Diabetes mellitus without complication - Plan: Hemoglobin A1c  Other hyperlipidemia  Results for orders placed or performed in visit on 12/31/21  PSA  Result Value Ref Range   Prostate Specific Ag, Serum 0.4 0.0 - 4.0 ng/mL  Lipid panel  Result Value Ref Range   Cholesterol, Total 198 100 - 199 mg/dL   Triglycerides 865 0 - 149 mg/dL   HDL 55 >78 mg/dL   VLDL Cholesterol Cal 25 5 - 40 mg/dL   LDL Chol Calc (NIH) 469 (H) 0 - 99 mg/dL   Chol/HDL Ratio 3.6 0.0 - 5.0 ratio  Basic metabolic panel  Result Value Ref Range   Glucose 135 (H) 70 - 99 mg/dL   BUN 11 8 - 27 mg/dL   Creatinine, Ser 6.29 0.76 - 1.27 mg/dL   eGFR 89 >52 WU/XLK/4.40   BUN/Creatinine Ratio 12 10 - 24   Sodium 139 134 - 144 mmol/L   Potassium 4.8 3.5 - 5.2 mmol/L   Chloride 101 96 - 106 mmol/L   CO2 25 20 - 29 mmol/L   Calcium 9.4 8.6 - 10.2 mg/dL  Hepatic Function Panel  Result Value Ref Range   Total Protein 6.9 6.0 - 8.5 g/dL   Albumin 4.5 3.9 - 4.9 g/dL   Bilirubin Total 0.3 0.0 - 1.2 mg/dL   Bilirubin, Direct <1.02 0.00 - 0.40 mg/dL   Alkaline Phosphatase 92 44 - 121 IU/L   AST 15 0 - 40 IU/L   ALT 21 0 - 44 IU/L  Hemoglobin A1c  Result Value Ref Range   Hgb A1c MFr Bld 6.7 (H) 4.8 - 5.6 %   Est. average glucose Bld gHb Est-mCnc 146 mg/dL  Lipid panel  Result Value Ref Range   Cholesterol, Total 188 100 - 199 mg/dL   Triglycerides 725 0 - 149 mg/dL   HDL 53 >36 mg/dL   VLDL Cholesterol Cal 25 5 - 40 mg/dL   LDL Chol Calc (NIH) 644 (H) 0 - 99 mg/dL   Chol/HDL Ratio 3.5 0.0 - 5.0 ratio  Hepatic function panel  Result Value Ref Range   Total Protein 7.0 6.0 - 8.5 g/dL   Albumin 4.5 3.9 - 4.9 g/dL   Bilirubin Total 0.4 0.0 - 1.2 mg/dL   Bilirubin, Direct 0.34 0.00 - 0.40 mg/dL   Alkaline Phosphatase 94 44 - 121  IU/L   AST 21 0 - 40 IU/L   ALT 24 0 - 44 IU/L    We discussed his lab work in detail A1c higher than what it needs to be He states he has been drinking a lot of soda and drinking some alcohol as well we discussed trying to balance out carbohydrates and limiting sugary drinks and carbohydrates in the drinks We also discussed regular physical activity portion control try to bring weight down In addition to this LDL higher than what it needs to be patient would like to have 3 months to work hard on things then we look at the A1c at that time if it is not doing better he will go on medicine    Review of Systems     Objective:   Physical Exam General-in no acute distress Eyes-no discharge Lungs-respiratory rate normal, CTA CV-no murmurs,RRR Extremities skin warm dry no edema Neuro  grossly normal Behavior normal, alert        Assessment & Plan:  1. Diabetes mellitus without complication Very important for patient to get his diet under better control limiting sugary drinks fit in more walking and exercise minimize starches check A1c again in 3 months time if it is not improved add medicine Follow-up in the fall for his standard wellness lab work and overview - Hemoglobin A1c  2. Other hyperlipidemia Continue medication  Follow-up in the fall

## 2022-08-12 ENCOUNTER — Other Ambulatory Visit: Payer: Self-pay | Admitting: Family Medicine

## 2022-10-15 DIAGNOSIS — E119 Type 2 diabetes mellitus without complications: Secondary | ICD-10-CM | POA: Diagnosis not present

## 2022-10-21 ENCOUNTER — Encounter: Payer: Self-pay | Admitting: *Deleted

## 2022-10-30 ENCOUNTER — Other Ambulatory Visit: Payer: Self-pay | Admitting: Family Medicine

## 2022-12-03 ENCOUNTER — Other Ambulatory Visit: Payer: Self-pay | Admitting: Family Medicine

## 2022-12-11 DIAGNOSIS — H01004 Unspecified blepharitis left upper eyelid: Secondary | ICD-10-CM | POA: Diagnosis not present

## 2022-12-11 DIAGNOSIS — H01002 Unspecified blepharitis right lower eyelid: Secondary | ICD-10-CM | POA: Diagnosis not present

## 2022-12-11 DIAGNOSIS — H401121 Primary open-angle glaucoma, left eye, mild stage: Secondary | ICD-10-CM | POA: Diagnosis not present

## 2022-12-11 DIAGNOSIS — H01001 Unspecified blepharitis right upper eyelid: Secondary | ICD-10-CM | POA: Diagnosis not present

## 2023-01-08 ENCOUNTER — Encounter: Payer: BC Managed Care – PPO | Admitting: Family Medicine

## 2023-01-11 IMAGING — US US AORTA
1 series · 14 of 25 positions shown · non-contrast
Comparison: CT pelvis 02/17/2020.  Abdominal ultrasound 01/14/2013.

CLINICAL DATA: AAA screening.

EXAM:
ULTRASOUND OF ABDOMINAL AORTA
TECHNIQUE: Ultrasound examination of the abdominal aorta and proximal common
iliac arteries was performed to evaluate for aneurysm. Additional
color and Doppler images of the distal aorta were obtained to
document patency.

[Series 1: us aorta · 14 of 27 slices shown]
[im 1/27]
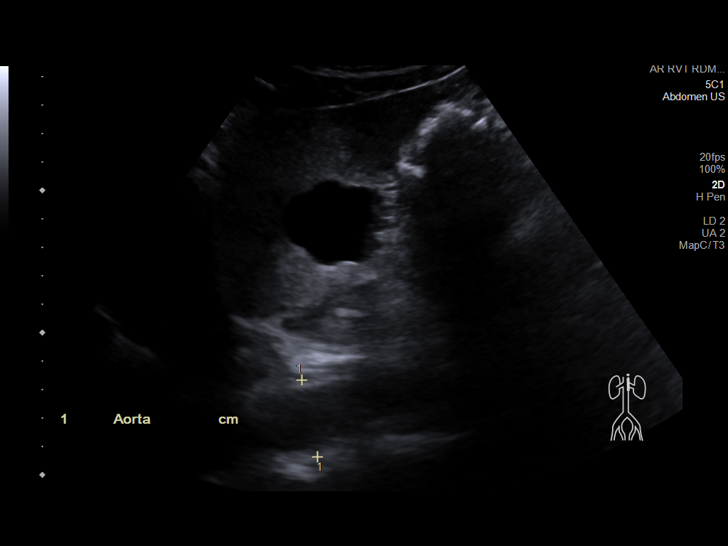
[im 3/27]
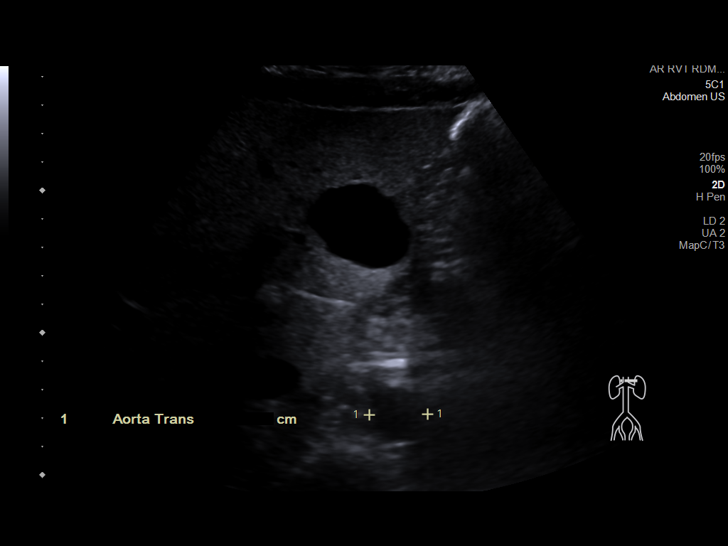
[im 5/27]
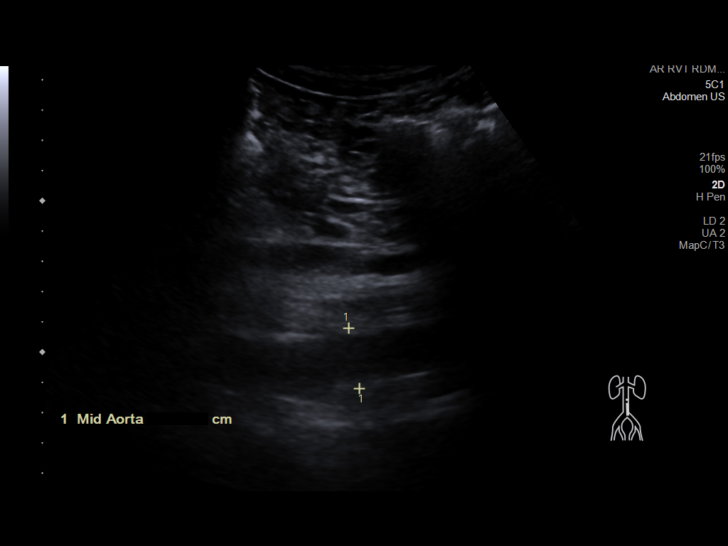
[im 7/27]
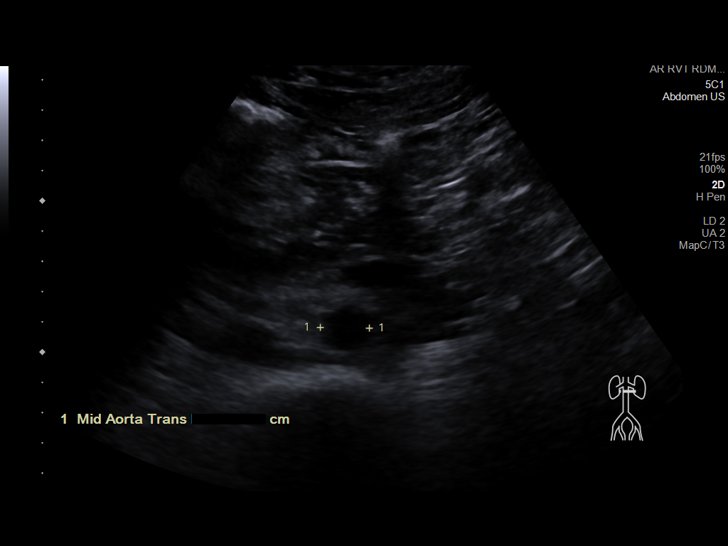
[im 9/27]
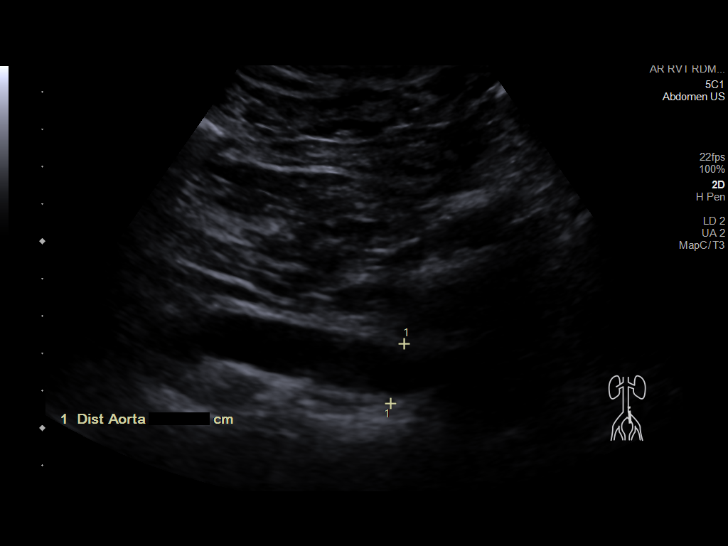
[im 10/27]
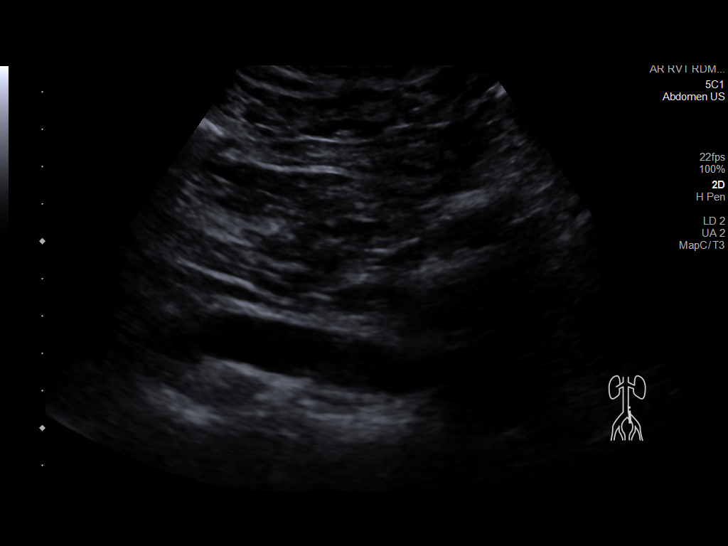
[im 12/27]
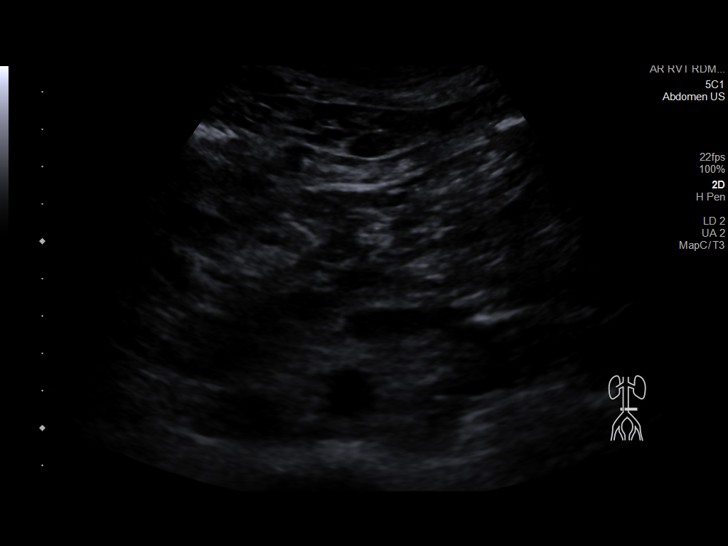
[im 15/27]
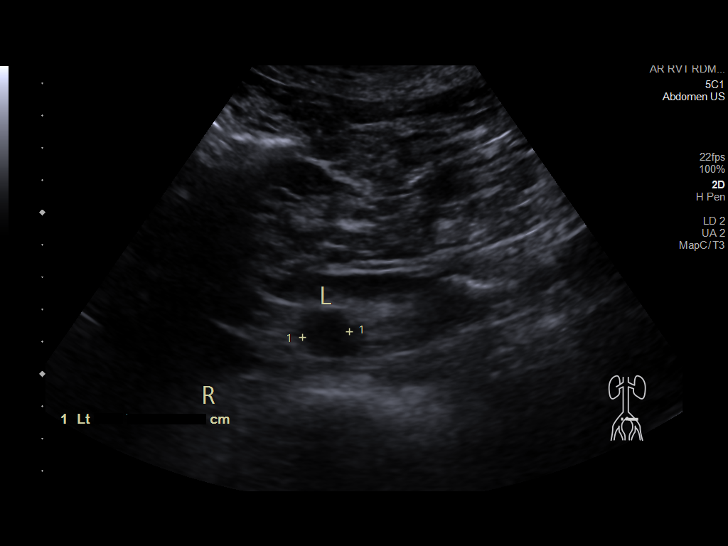
[im 17/27]
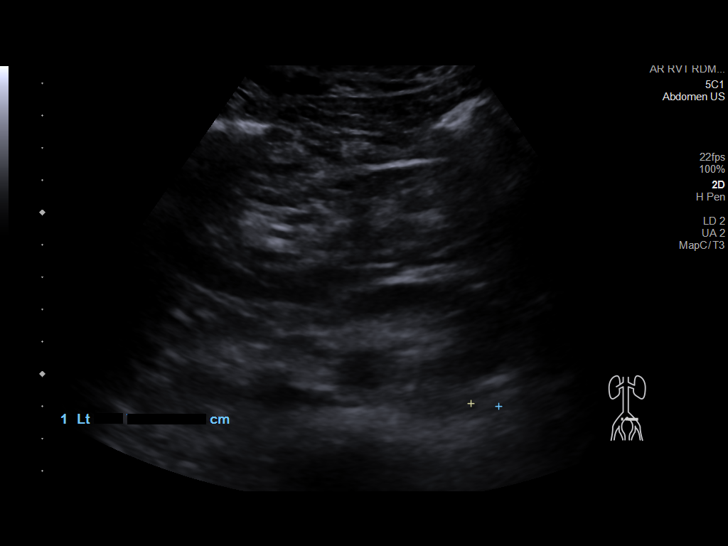
[im 18/27]
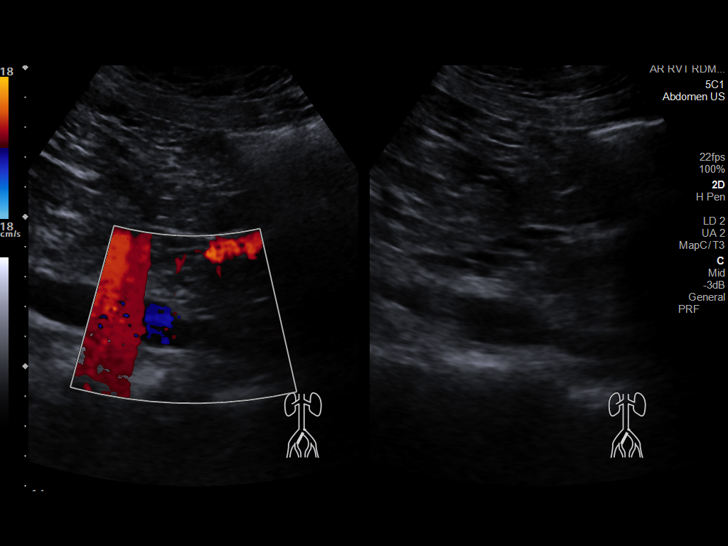
[im 20/27]
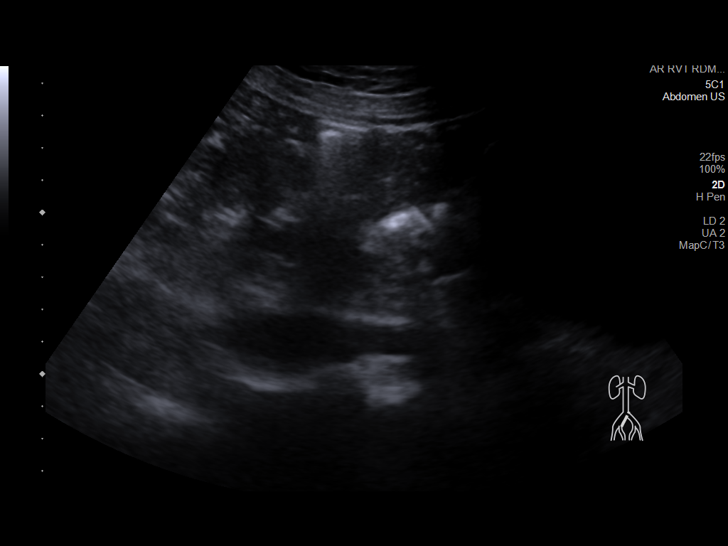
[im 22/27]
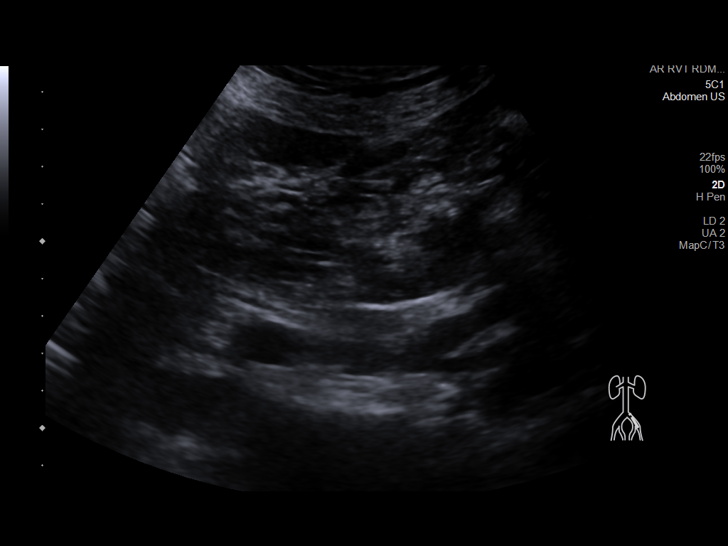
[im 24/27]
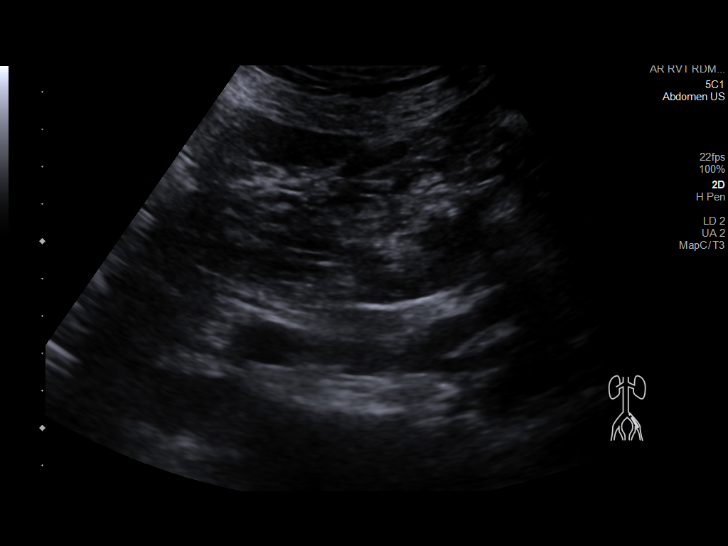
[im 27/27]
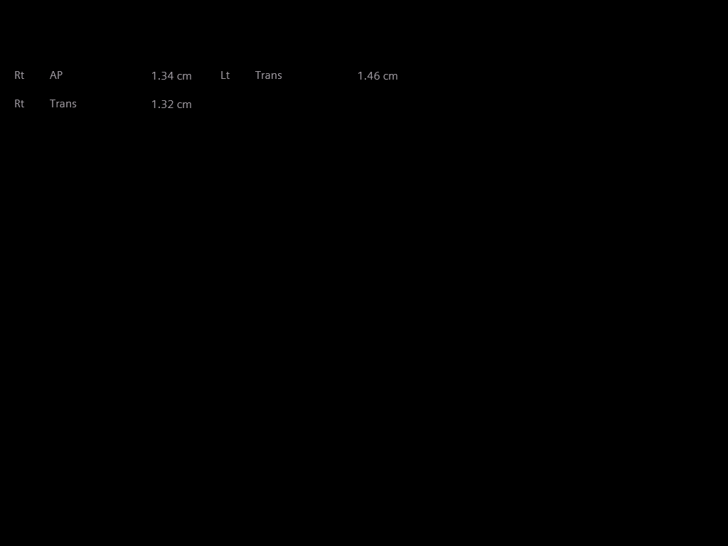

[14 of 25 positions shown; findings below may reference images not displayed]

FINDINGS: Abdominal aortic measurements as follows:

Proximal:  2.8 cm cm

Mid:  2.0 cm cm

Distal:  1.6 cm cm
Patent: Yes, peak systolic velocity is 93 cm/s

Right common iliac artery: 1.3 cm

Left common iliac artery: 1.5 cm

Benign-appearing hepatic cyst again noted.
IMPRESSION: 1. Proximal abdominal aortic ectasia at 2.8 cm. Ectatic abdominal
aorta at risk for aneurysm development. Recommend follow up by US in
5 years. This recommendation follows ACR consensus guidelines: White
Paper of the ACR Incidental Findings Committee II on Vascular
Findings. [HOSPITAL] 2390; [DATE].

2.  Bilateral common iliac artery ectasia.

## 2023-01-11 IMAGING — US US CAROTID DUPLEX BILAT
1 series · 13 of 24 positions shown · non-contrast
Comparison: 08/24/2019

CLINICAL DATA: 69-year-old male with a history of carotid
atherosclerosis

EXAM:
BILATERAL CAROTID DUPLEX ULTRASOUND
TECHNIQUE: Gray scale imaging, color Doppler and duplex ultrasound were
performed of bilateral carotid and vertebral arteries in the neck.

[Series 1: us carotid bilateral · 13 of 68 slices shown]
[im 1/68]
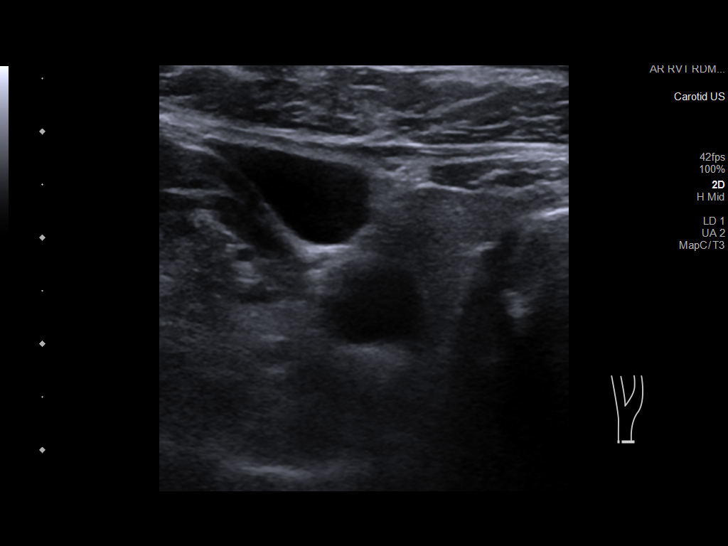
[im 6/68]
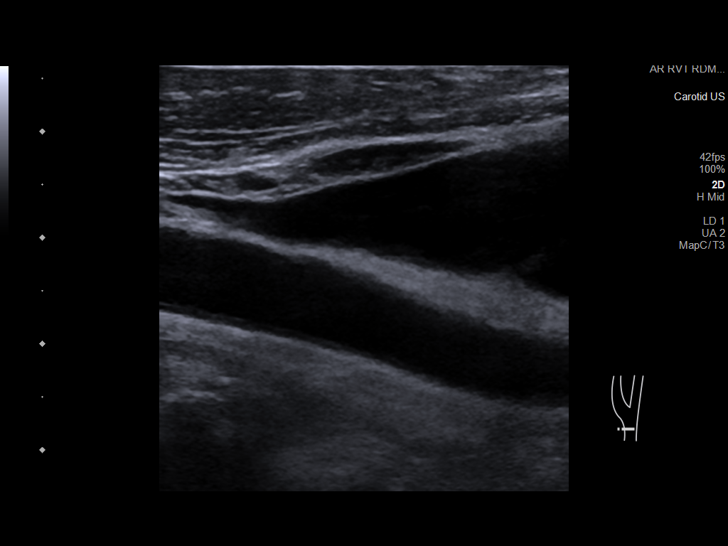
[im 12/68]
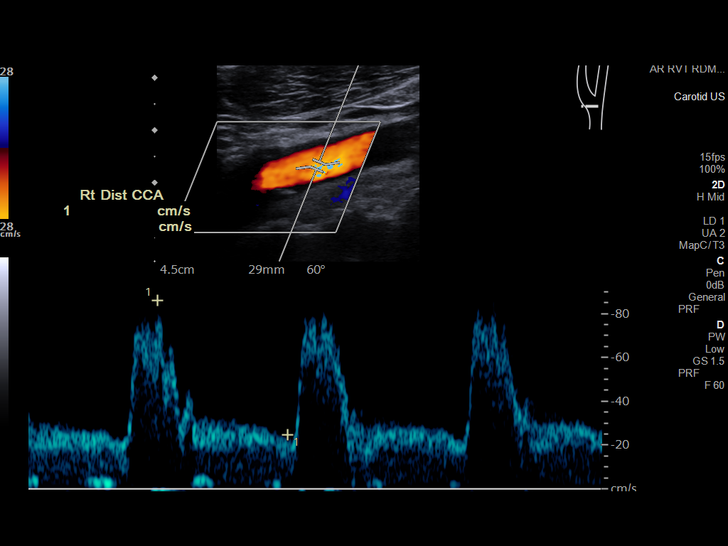
[im 18/68]
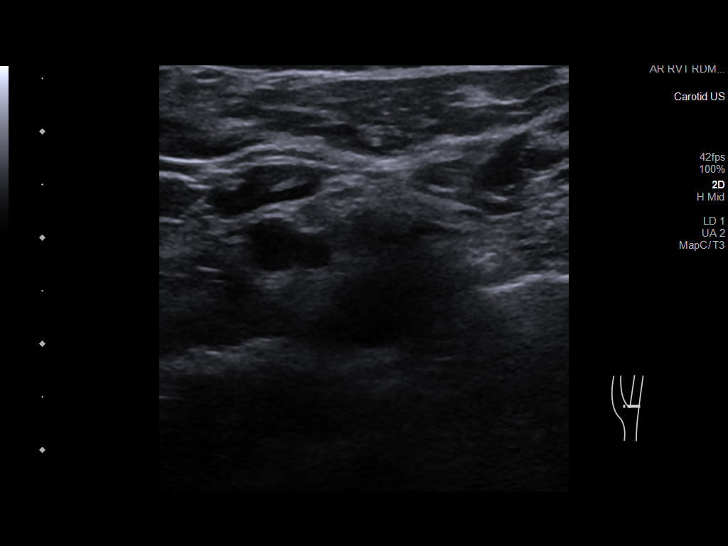
[im 24/68]
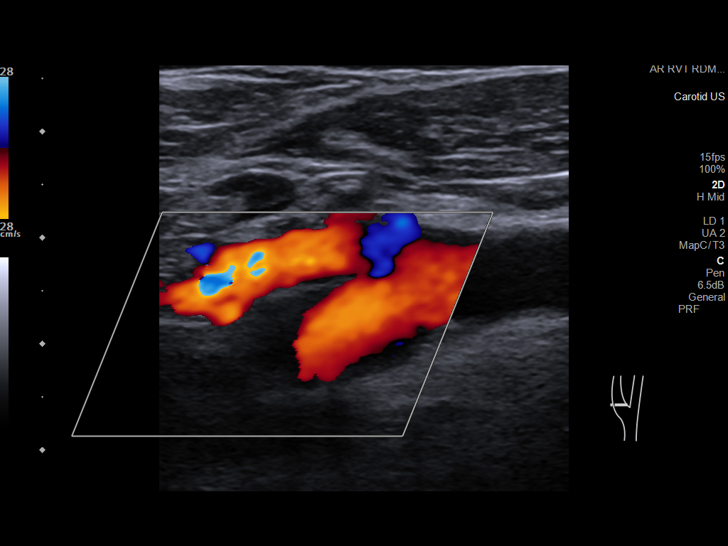
[im 30/68]
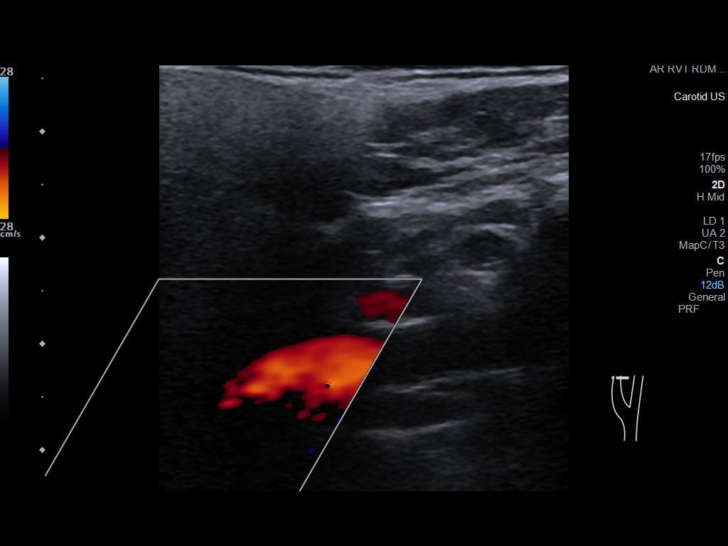
[im 35/68]
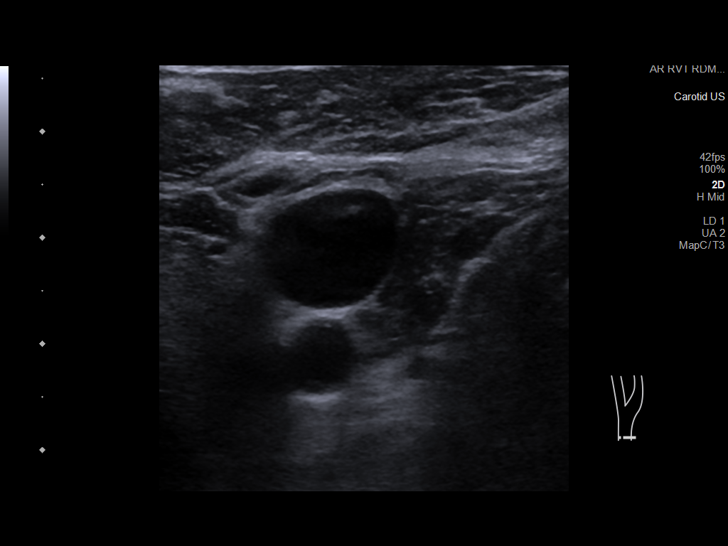
[im 38/68]
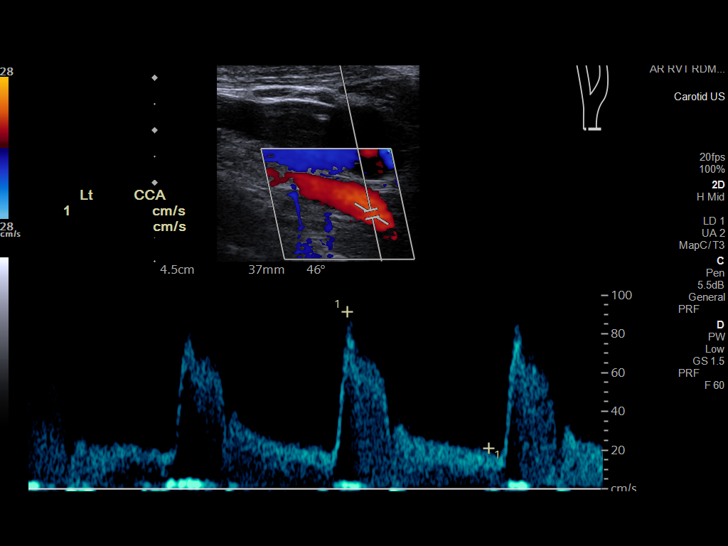
[im 44/68]
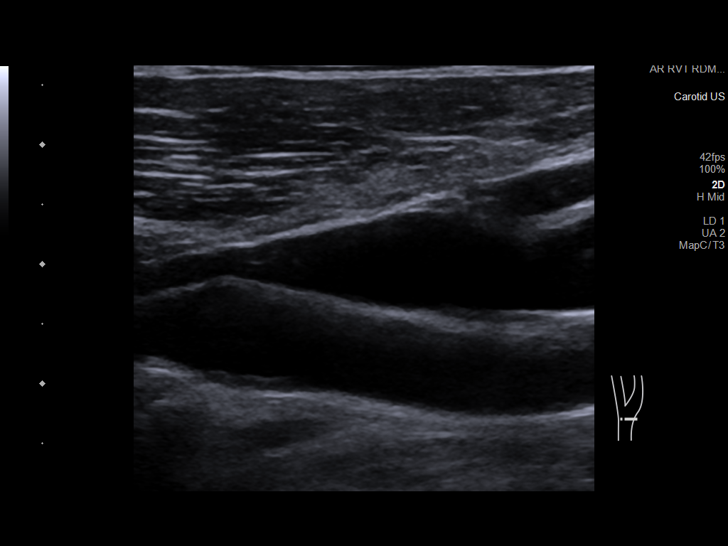
[im 50/68]
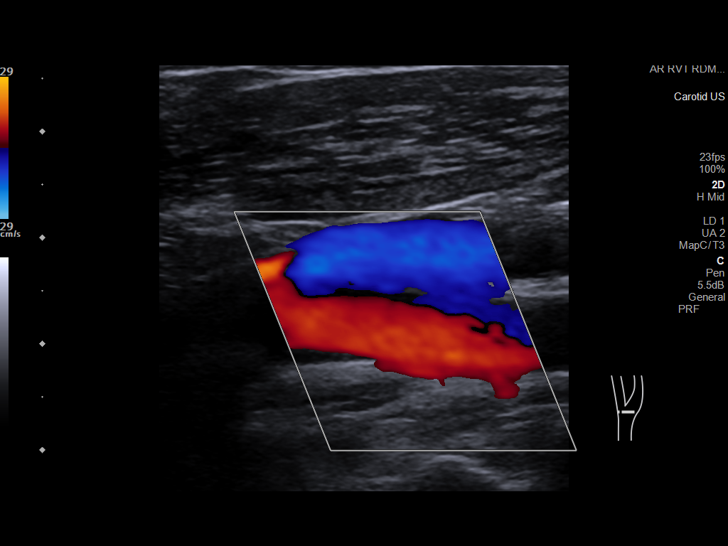
[im 56/68]
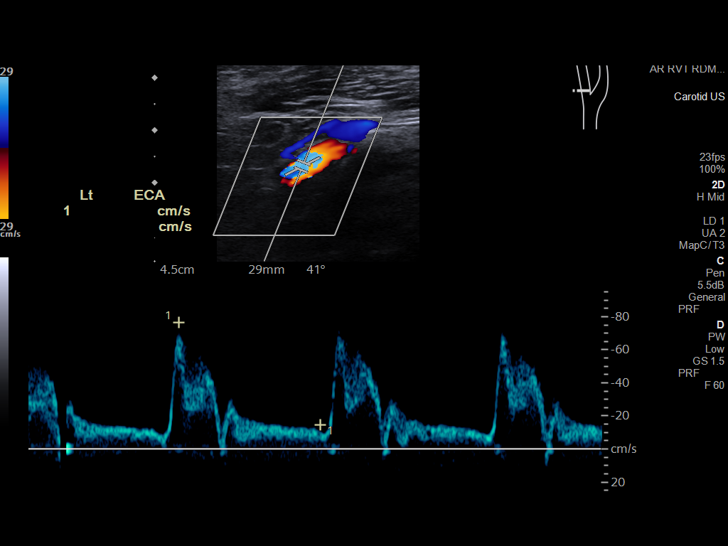
[im 62/68]
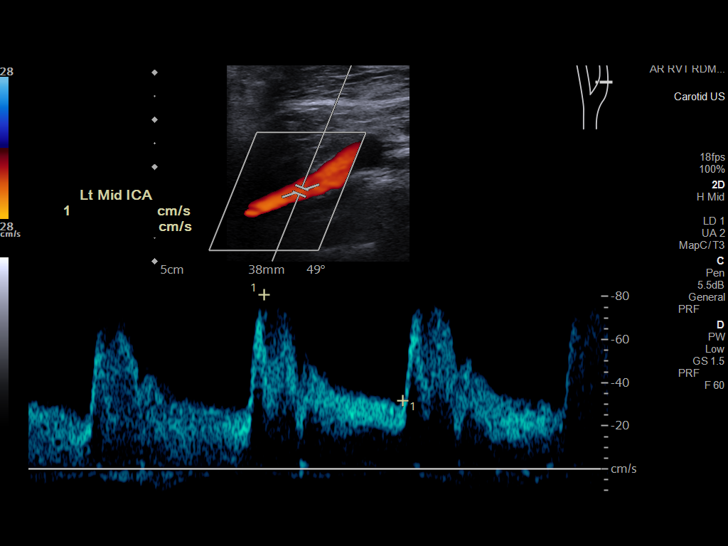
[im 68/68]
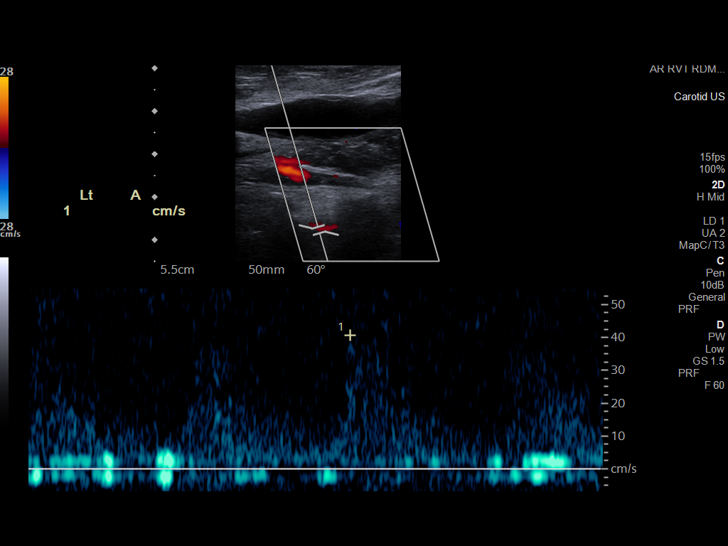

[13 of 24 positions shown; findings below may reference images not displayed]

FINDINGS: Criteria: Quantification of carotid stenosis is based on velocity
parameters that correlate the residual internal carotid diameter
with NASCET-based stenosis levels, using the diameter of the distal
internal carotid lumen as the denominator for stenosis measurement.

The following velocity measurements were obtained:

RIGHT

ICA:  Systolic 103 cm/sec, Diastolic 40 cm/sec

CCA:  97 cm/sec

SYSTOLIC ICA/CCA RATIO:

ECA:  85 cm/sec

LEFT

ICA:  Systolic 91 cm/sec, Diastolic 31 cm/sec

CCA:  84 cm/sec

SYSTOLIC ICA/CCA RATIO:

ECA:  77 cm/sec

Right Brachial SBP: Not acquired

Left Brachial SBP: Not acquired

RIGHT CAROTID ARTERY: No significant calcified disease of the right
common carotid artery. Intermediate waveform maintained.
Heterogeneous plaque without significant calcifications at the right
carotid bifurcation. Low resistance waveform of the right ICA. No
significant tortuosity.

RIGHT VERTEBRAL ARTERY: Antegrade flow with low resistance waveform.

LEFT CAROTID ARTERY: No significant calcified disease of the left
common carotid artery. Intermediate waveform maintained.
Heterogeneous plaque at the left carotid bifurcation without
significant calcifications. Low resistance waveform of the left ICA.

LEFT VERTEBRAL ARTERY:  Antegrade flow with low resistance waveform.
IMPRESSION: Color duplex indicates minimal heterogeneous plaque, with no
hemodynamically significant stenosis by duplex criteria in the
extracranial cerebrovascular circulation.

## 2023-01-13 ENCOUNTER — Ambulatory Visit (INDEPENDENT_AMBULATORY_CARE_PROVIDER_SITE_OTHER): Payer: BC Managed Care – PPO | Admitting: Family Medicine

## 2023-01-13 ENCOUNTER — Encounter: Payer: Self-pay | Admitting: Family Medicine

## 2023-01-13 VITALS — BP 132/84 | HR 64 | Temp 97.2°F | Ht 63.75 in | Wt 164.8 lb

## 2023-01-13 DIAGNOSIS — Z23 Encounter for immunization: Secondary | ICD-10-CM

## 2023-01-13 DIAGNOSIS — Z0001 Encounter for general adult medical examination with abnormal findings: Secondary | ICD-10-CM | POA: Diagnosis not present

## 2023-01-13 DIAGNOSIS — E7849 Other hyperlipidemia: Secondary | ICD-10-CM

## 2023-01-13 DIAGNOSIS — Z125 Encounter for screening for malignant neoplasm of prostate: Secondary | ICD-10-CM | POA: Diagnosis not present

## 2023-01-13 DIAGNOSIS — Z79899 Other long term (current) drug therapy: Secondary | ICD-10-CM

## 2023-01-13 DIAGNOSIS — E119 Type 2 diabetes mellitus without complications: Secondary | ICD-10-CM | POA: Diagnosis not present

## 2023-01-13 DIAGNOSIS — Z0000 Encounter for general adult medical examination without abnormal findings: Secondary | ICD-10-CM

## 2023-01-13 MED ORDER — ROSUVASTATIN CALCIUM 20 MG PO TABS
ORAL_TABLET | ORAL | 1 refills | Status: DC
Start: 2023-01-13 — End: 2023-03-09

## 2023-01-13 NOTE — Progress Notes (Signed)
Subjective:    Patient ID: Ronnie Walker, male    DOB: [birth year], 72 y.o.   MRN: [id number]  HPI The patient comes in today for a wellness visit.  He had migrated for 30 years daily.  Okay  A review of their health history was completed.  A review of medications was also completed.  Any needed refills; Crestor  Eating habits: Good  Falls/  MVA accidents in past few months: No Regular exercise: Yes, ride bike and climbing at work  Specialist pt sees on regular basis: Ophthalmologist   Preventative health issues were discussed.   Additional concerns: none at this time Flu Shot  Discussed the use of AI scribe software for clinical note transcription with the patient, who gave verbal consent to proceed.  History of Present Illness   The patient, with a history of ocular hypertension and diabetes, reports overall good health. He has been managing his ocular hypertension with prescribed eye drops, resulting in a significant reduction in intraocular pressure. The patient denies any diabetes-related changes in his eyes, confirmed by a recent eye examination.  The patient's vision has slightly changed, particularly for near vision, but distance vision remains good. He has been advised that his distance vision will eventually change.  Regarding his diet, the patient has been making efforts to maintain healthy eating habits, reducing his sugar intake significantly. His primary source of hydration is water, with occasional unsweetened tea. The patient remains physically active, particularly on non-working days.  The patient has been taking rosuvastatin for cholesterol management and an eye drop for ocular hypertension. He discontinued a medication due to its side effect of diarrhea. The patient denies any breathing difficulties or chest discomfort. Urination frequency is reported to be high due to significant fluid intake, but this is not causing any distress.  The patient has a small  umbilical hernia, which occasionally causes discomfort but is not currently a significant concern. He is aware of the potential complications and understands when to seek emergency care.  The patient's sleep pattern is regular, typically going to bed around 9:30 PM and waking up at 4:30 AM. He reports no significant hearing loss and is managing well with his current hearing ability.      Review of Systems     Objective:   Physical Exam General-in no acute distress Eyes-no discharge Lungs-respiratory rate normal, CTA CV-no murmurs,RRR Extremities skin warm dry no edema Neuro grossly normal Behavior normal, alert  Prostate exam normal      Assessment & Plan:  Assessment and Plan    Ocular Hypertension Improved intraocular pressure in the left eye with daily eye drops. No changes related to diabetes observed in the recent eye exam. Vision changes noted for near vision. -Continue daily eye drops as prescribed by ophthalmologist.  Type 2 Diabetes No new symptoms reported. Patient maintains a diet with limited sugar intake and stays physically active. -Continue current management plan.  Hyperlipidemia Patient is compliant with daily rosuvastatin. -Refill rosuvastatin prescription.  Umbilical Hernia Occasional pain reported. No signs of strangulation. -Continue monitoring. Seek immediate medical attention if severe pain and vomiting occur.  Hypertension Blood pressure slightly elevated at 132/84. Patient reports high salt intake from recent meal. -Continue monitoring blood pressure. Encourage low-salt diet.  General Health Maintenance -Order blood work to check sugars, cholesterol, and PSA. -Consider getting shingles vaccine at the pharmacy. -Colonoscopy due in 2027. -Ultrasound of the aorta due in 2027. -Administer flu vaccine today.      1.  Immunization due Today - Flu Vaccine Trivalent High Dose (Fluad)  2. Well adult exam Adult wellness-complete.wellness  physical was conducted today. Importance of diet and exercise were discussed in detail.  Importance of stress reduction and healthy living were discussed.  In addition to this a discussion regarding safety was also covered.  We also reviewed over immunizations and gave recommendations regarding current immunization needed for age.   In addition to this additional areas were also touched on including: Preventative health exams needed:  Colonoscopy 2027  Patient was advised yearly wellness exam   3. Other hyperlipidemia Continue statin - Lipid Panel  4. Diabetes mellitus without complication (HCC) Keep A1c under good control with dietary measures if it goes up will need to be on medication - Hemoglobin A1c - Microalbumin/Creatinine Ratio, Urine  5. Screening PSA (prostate specific antigen) Screening PSA - PSA  6. High risk medication use Labs ordered - Hepatic Function Panel

## 2023-01-13 NOTE — Patient Instructions (Signed)

## 2023-01-30 DIAGNOSIS — E119 Type 2 diabetes mellitus without complications: Secondary | ICD-10-CM | POA: Diagnosis not present

## 2023-01-30 DIAGNOSIS — E7849 Other hyperlipidemia: Secondary | ICD-10-CM | POA: Diagnosis not present

## 2023-01-30 DIAGNOSIS — Z79899 Other long term (current) drug therapy: Secondary | ICD-10-CM | POA: Diagnosis not present

## 2023-01-30 DIAGNOSIS — Z125 Encounter for screening for malignant neoplasm of prostate: Secondary | ICD-10-CM | POA: Diagnosis not present

## 2023-01-31 LAB — HEMOGLOBIN A1C
Est. average glucose Bld gHb Est-mCnc: 146 mg/dL
Hgb A1c MFr Bld: 6.7 % — ABNORMAL HIGH (ref 4.8–5.6)

## 2023-01-31 LAB — HEPATIC FUNCTION PANEL
ALT: 17 [IU]/L (ref 0–44)
AST: 17 [IU]/L (ref 0–40)
Albumin: 4.4 g/dL (ref 3.9–4.9)
Alkaline Phosphatase: 96 [IU]/L (ref 44–121)
Bilirubin Total: 0.3 mg/dL (ref 0.0–1.2)
Bilirubin, Direct: 0.1 mg/dL (ref 0.00–0.40)
Total Protein: 6.8 g/dL (ref 6.0–8.5)

## 2023-01-31 LAB — MICROALBUMIN / CREATININE URINE RATIO
Creatinine, Urine: 69.1 mg/dL
Microalb/Creat Ratio: <4 mg/g{creat} (ref 0–29)
Microalbumin, Urine: <3 ug/mL

## 2023-01-31 LAB — LIPID PANEL
Chol/HDL Ratio: 3.5 ratio (ref 0.0–5.0)
Cholesterol, Total: 190 mg/dL (ref 100–199)
HDL: 54 mg/dL (ref >39)
LDL Chol Calc (NIH): 116 mg/dL — ABNORMAL HIGH (ref 0–99)
Triglycerides: 114 mg/dL (ref 0–149)
VLDL Cholesterol Cal: 20 mg/dL (ref 5–40)

## 2023-01-31 LAB — PSA: Prostate Specific Ag, Serum: 0.5 ng/mL (ref 0.0–4.0)

## 2023-02-24 ENCOUNTER — Encounter: Payer: Self-pay | Admitting: *Deleted

## 2023-03-09 ENCOUNTER — Other Ambulatory Visit: Payer: Self-pay | Admitting: Family Medicine

## 2023-04-07 ENCOUNTER — Ambulatory Visit (HOSPITAL_COMMUNITY)
Admission: RE | Admit: 2023-04-07 | Discharge: 2023-04-07 | Disposition: A | Discharge status: Home / Self Care | Payer: BC Managed Care – PPO | Source: Ambulatory Visit | Attending: Nurse Practitioner | Admitting: Nurse Practitioner

## 2023-04-07 ENCOUNTER — Ambulatory Visit: Payer: BC Managed Care – PPO | Admitting: Nurse Practitioner

## 2023-04-07 ENCOUNTER — Encounter: Payer: Self-pay | Admitting: Nurse Practitioner

## 2023-04-07 VITALS — BP 166/102 | HR 64 | Temp 97.2°F | Ht 63.75 in | Wt 166.0 lb

## 2023-04-07 DIAGNOSIS — M25511 Pain in right shoulder: Secondary | ICD-10-CM | POA: Diagnosis not present

## 2023-04-07 DIAGNOSIS — R030 Elevated blood-pressure reading, without diagnosis of hypertension: Secondary | ICD-10-CM

## 2023-04-07 NOTE — Progress Notes (Signed)
   Subjective:    Patient ID: Ronnie Walker, male    DOB: 1950/07/19, 73 y.o.   MRN: [id number]  HPI Presents today with right shoulder pain. He was lifting something or over his head about two months ago, and felt a strain in his right shoulder. Describes the pain as aching and does not radiate. Only has the pain with use of shoulder when lifting. Has improved since initial injury, but is still occurring. Denies numbness or tingling in extremities. Endorses a "popping" sensation with use of right shoulder. Has not tried ice or heat therapy. No tylenol or ibuprofen use.    Review of Systems  Respiratory:  Negative for cough, chest tightness, shortness of breath and wheezing.   Musculoskeletal:        Positive for right shoulder pain with lifting and ROM.       Objective:   Physical Exam Constitutional:      General: He is not in acute distress.    Appearance: He is not ill-appearing.  Cardiovascular:     Rate and Rhythm: Normal rate and regular rhythm.     Heart sounds: Normal heart sounds, S1 normal and S2 normal. No murmur heard. Pulmonary:     Effort: Pulmonary effort is normal. No respiratory distress.     Breath sounds: No wheezing.  Musculoskeletal:     Cervical back: Normal range of motion.     Comments: No bruising, ecchymosis or obvious abnormalities noted of the right shoulder. No tenderness with palpation. Pain on the glenohumeral joint with passive ROM. Audible popping noted with ROM. Normal strength and ROM of bilateral shoulders and arms.   Neurological:     Mental Status: He is alert.    Vitals:   04/07/23 1049 04/07/23 1124  BP: (!) 171/90 (!) 166/102  Pulse: 64   Temp: (!) 97.2 F (36.2 C)   Height: 5' 3.75" (1.619 m)   Weight: 166 lb (75.3 kg)   SpO2: 97%   BMI (Calculated): 28.73        Assessment & Plan:  1. Right shoulder pain, unspecified chronicity (Primary) - DG Shoulder Right; Future  -Provided range of motion exercises for the  shoulder. -Advised alternating heat and ice therapy. -Recommended topical biofreeze or Voltaren gel for pain.  -If no improvement, recommend referral to orthopedic specialist.   2. Elevated BP Monitor BP outside of the office and contact us if remains above 140/90. Note that all the BPs in our office have been normal for over a year.  Follow up with Dr. Lorin Picket in May as planned.   Return if symptoms worsen or fail to improve.  I have seen and examined this patient alongside the NP student. I have reviewed and verified the student note and agree with the assessment and plan.  Sherie Don, FNP

## 2023-04-10 ENCOUNTER — Other Ambulatory Visit: Payer: Self-pay | Admitting: Family Medicine

## 2023-04-10 ENCOUNTER — Encounter: Payer: Self-pay | Admitting: Nurse Practitioner

## 2023-04-14 ENCOUNTER — Encounter: Payer: Self-pay | Admitting: Nurse Practitioner

## 2023-06-25 DIAGNOSIS — H01001 Unspecified blepharitis right upper eyelid: Secondary | ICD-10-CM | POA: Diagnosis not present

## 2023-06-25 DIAGNOSIS — H01004 Unspecified blepharitis left upper eyelid: Secondary | ICD-10-CM | POA: Diagnosis not present

## 2023-06-25 DIAGNOSIS — H401121 Primary open-angle glaucoma, left eye, mild stage: Secondary | ICD-10-CM | POA: Diagnosis not present

## 2023-06-25 DIAGNOSIS — H01002 Unspecified blepharitis right lower eyelid: Secondary | ICD-10-CM | POA: Diagnosis not present

## 2023-07-16 ENCOUNTER — Ambulatory Visit: Payer: BC Managed Care – PPO | Admitting: Family Medicine

## 2023-07-17 ENCOUNTER — Ambulatory Visit: Payer: BC Managed Care – PPO | Admitting: Family Medicine

## 2023-07-17 ENCOUNTER — Encounter: Payer: Self-pay | Admitting: Family Medicine

## 2023-07-17 VITALS — BP 136/84 | HR 52 | Temp 97.5°F | Ht 63.75 in | Wt 167.6 lb

## 2023-07-17 DIAGNOSIS — E7849 Other hyperlipidemia: Secondary | ICD-10-CM | POA: Diagnosis not present

## 2023-07-17 DIAGNOSIS — E1169 Type 2 diabetes mellitus with other specified complication: Secondary | ICD-10-CM | POA: Diagnosis not present

## 2023-07-17 DIAGNOSIS — M25511 Pain in right shoulder: Secondary | ICD-10-CM

## 2023-07-17 DIAGNOSIS — Z79899 Other long term (current) drug therapy: Secondary | ICD-10-CM | POA: Diagnosis not present

## 2023-07-17 DIAGNOSIS — E119 Type 2 diabetes mellitus without complications: Secondary | ICD-10-CM

## 2023-07-17 NOTE — Progress Notes (Signed)
   Subjective:    Patient ID: Ronnie Walker, male    DOB: 08/29/1950, 73 y.o.   MRN: [id number]  HPI  Shoulder pain -pain with ROM  6  month f/u for chronic conditions:  Hypertension  Dm2  Hyperlipidemia  Discussed the use of AI scribe software for clinical note transcription with the patient, who gave verbal consent to proceed.  History of Present Illness   Ronnie Walker is a 73 year old male with hypertension who presents with shoulder pain and hypertension management.  He experiences shoulder pain, particularly when lifting objects or performing certain movements such as lifting a pitcher of tea. The pain is located in the shoulder area with occasional popping sensations. It is not constant but occurs with specific activities. He has been performing exercises to improve the condition, which have been somewhat difficult, and notes that the shoulder is better but not where he thinks it should be. The pain started after a specific movement involving lifting and rotating his arm.  He monitors his blood pressure at home, with typical readings around 130/70 mmHg, but notes a recent high reading of 180 mmHg. He describes the process of how his blood pressure was checked, noting that his arm was not resting properly during the measurement. He is trying to eat healthily but occasionally indulges in foods like pizza and snacks such as 'nutty buddies'.  He is currently taking rosuvastatin  (Crestor ) every morning for cholesterol management. He also uses Tamaril eye drops once daily for his left eye. He had an eye checkup recently, and his vision has not changed. His eye care providers are aware of his diabetes.  He has a history of diabetes, as indicated by the mention of monitoring his A1c levels. He had blood work done in November and is due for another check to assess his A1c and cholesterol levels.        Review of Systems     Objective:   Physical Exam General-in no acute  distress Eyes-no discharge Lungs-respiratory rate normal, CTA CV-no murmurs,RRR Extremities skin warm dry no edema Neuro grossly normal Behavior normal, alert Patient with decreased range of motion of the right shoulder with some evidence of impingement on the right side possible rotator cuff partial tear hard to tell       Assessment & Plan:   1. Right shoulder pain, unspecified chronicity (Primary) Recommend referral to Dr.Cairns for further evaluation - Ambulatory referral to Orthopedic Surgery  2. Diabetes mellitus without complication (HCC) Portion control regular physical activity check A1c patient in the past is wanted to control his diabetes with diet and activity - Basic metabolic panel with GFR - Hemoglobin A1c  3. Other hyperlipidemia Check lipid profile continue medication keep LDL below 70 - Lipid panel  4. High risk medication use Labs ordered - Hepatic function panel  Follow-up 6 months

## 2023-07-31 DIAGNOSIS — Z79899 Other long term (current) drug therapy: Secondary | ICD-10-CM | POA: Diagnosis not present

## 2023-07-31 DIAGNOSIS — E119 Type 2 diabetes mellitus without complications: Secondary | ICD-10-CM | POA: Diagnosis not present

## 2023-07-31 DIAGNOSIS — E7849 Other hyperlipidemia: Secondary | ICD-10-CM | POA: Diagnosis not present

## 2023-08-01 LAB — BASIC METABOLIC PANEL WITH GFR
BUN/Creatinine Ratio: 11 (ref 10–24)
BUN: 10 mg/dL (ref 8–27)
CO2: 23 mmol/L (ref 20–29)
Calcium: 9.4 mg/dL (ref 8.6–10.2)
Chloride: 100 mmol/L (ref 96–106)
Creatinine, Ser: 0.87 mg/dL (ref 0.76–1.27)
Glucose: 124 mg/dL — ABNORMAL HIGH (ref 70–99)
Potassium: 4.7 mmol/L (ref 3.5–5.2)
Sodium: 137 mmol/L (ref 134–144)
eGFR: 92 mL/min/{1.73_m2} (ref >59)

## 2023-08-01 LAB — LIPID PANEL
Chol/HDL Ratio: 3.7 ratio (ref 0.0–5.0)
Cholesterol, Total: 208 mg/dL — ABNORMAL HIGH (ref 100–199)
HDL: 56 mg/dL (ref >39)
LDL Chol Calc (NIH): 113 mg/dL — ABNORMAL HIGH (ref 0–99)
Triglycerides: 223 mg/dL — ABNORMAL HIGH (ref 0–149)
VLDL Cholesterol Cal: 39 mg/dL (ref 5–40)

## 2023-08-01 LAB — HEPATIC FUNCTION PANEL
ALT: 28 IU/L (ref 0–44)
AST: 20 IU/L (ref 0–40)
Albumin: 4.5 g/dL (ref 3.8–4.8)
Alkaline Phosphatase: 95 IU/L (ref 44–121)
Bilirubin Total: 0.4 mg/dL (ref 0.0–1.2)
Bilirubin, Direct: 0.11 mg/dL (ref 0.00–0.40)
Total Protein: 7.1 g/dL (ref 6.0–8.5)

## 2023-08-01 LAB — HEMOGLOBIN A1C
Est. average glucose Bld gHb Est-mCnc: 143 mg/dL
Hgb A1c MFr Bld: 6.6 % — ABNORMAL HIGH (ref 4.8–5.6)

## 2023-08-03 ENCOUNTER — Ambulatory Visit: Payer: Self-pay | Admitting: Family Medicine

## 2023-08-05 ENCOUNTER — Other Ambulatory Visit: Payer: Self-pay | Admitting: Family Medicine

## 2023-08-05 ENCOUNTER — Telehealth: Payer: Self-pay | Admitting: *Deleted

## 2023-08-05 MED ORDER — ROSUVASTATIN CALCIUM 40 MG PO TABS: 40.0000 mg | ORAL_TABLET | Freq: Every day | ORAL | 5 refills | Status: DC

## 2023-08-05 NOTE — Telephone Encounter (Signed)
 Copied from CRM [id number]. Topic: Clinical - Lab/Test Results >> Aug 05, 2023  9:17 AM Opal Bill wrote: Reason for CRM: Patient called back and results were relayed. Patient is willing to increase his dose of rosuvastatin . Please inform him once the rx has been sent.

## 2023-08-05 NOTE — Telephone Encounter (Signed)
 His LDL was above 100 we recommended to increase rosuvastatin  to get LDL below 70 if possible Patient gave consent 40 mg Crestor  each evening 30-day supply with 5 refills was sent into [facility]  Patient to do A1c, lipid, liver, metabolic 7, PSA, urine ACR he can do this lab work 1 to 2 weeks before his physical in November Diagnosis diabetes, hyperlipidemia, high risk medication, wellness, prostate cancer screening

## 2023-08-05 NOTE — Progress Notes (Signed)
 His previous LDL was above 100 the goal is to get below 70 patient gave consent to increase the dose new dose was sent in  We will do follow-up lab work again before his next visit in November

## 2023-08-06 ENCOUNTER — Other Ambulatory Visit: Payer: Self-pay

## 2023-08-06 DIAGNOSIS — E119 Type 2 diabetes mellitus without complications: Secondary | ICD-10-CM

## 2023-08-06 DIAGNOSIS — Z79899 Other long term (current) drug therapy: Secondary | ICD-10-CM

## 2023-08-06 DIAGNOSIS — Z125 Encounter for screening for malignant neoplasm of prostate: Secondary | ICD-10-CM

## 2023-08-06 DIAGNOSIS — R739 Hyperglycemia, unspecified: Secondary | ICD-10-CM

## 2023-08-06 DIAGNOSIS — E7849 Other hyperlipidemia: Secondary | ICD-10-CM

## 2023-08-06 MED ORDER — ROSUVASTATIN CALCIUM 40 MG PO TABS: 40.0000 mg | ORAL_TABLET | Freq: Every day | ORAL | 5 refills | Status: DC

## 2023-09-02 ENCOUNTER — Encounter: Payer: Self-pay | Admitting: Orthopaedic Surgery

## 2023-09-02 ENCOUNTER — Ambulatory Visit: Admitting: Orthopaedic Surgery

## 2023-09-02 VITALS — BP 144/80 | HR 51 | Ht 65.0 in | Wt 162.0 lb

## 2023-09-02 DIAGNOSIS — M25511 Pain in right shoulder: Secondary | ICD-10-CM

## 2023-09-02 DIAGNOSIS — G8929 Other chronic pain: Secondary | ICD-10-CM

## 2023-09-02 NOTE — Progress Notes (Signed)
 Subjective:    Patient ID: Ronnie Walker, male    DOB: 1951/02/16, 73 y.o.   MRN: [id number]  HPI He hurt his right shoulder late September, early October 2024 while lifting an object.  He could hardly move it for several days.  Then it slowly got better but after Christmas 2024 he had more pain in the cold weather.  He was seen at Dr. Sherri Doffing office in January 2025 for this.  He had X-rays on 04-12-23 and was given medicine for the shoulder.  It helped only slightly.  He saw Dr. Fairy Homer on 07-17-23 and told about the pain still continued.  He has pain laying on it at night and lifting above his head.  He has no new trauma.  Dr. Fairy Homer asked he come in here today.  Tylenol  does not help.  I have independently reviewed and interpreted x-rays of this patient done at another site by another physician or qualified health professional.    Review of Systems  Constitutional:  Positive for activity change.  Musculoskeletal:  Positive for arthralgias and back pain.  All other systems reviewed and are negative. For Review of Systems, all other systems reviewed and are negative.  The following is a summary of the past history medically, past history surgically, known current medicines, social history and family history.  This information is gathered electronically by the computer from prior information and documentation.  I review this each visit and have found including this information at this point in the chart is beneficial and informative.   Past Medical History:  Diagnosis Date   CVA (cerebral vascular accident) (HCC)    08/2019   Hyperlipidemia     Past Surgical History:  Procedure Laterality Date   BACK SURGERY     COLONOSCOPY WITH PROPOFOL  N/A 03/27/2020   Procedure: COLONOSCOPY WITH PROPOFOL ;  Surgeon: Vinetta Greening, DO;  Location: [facility];  Service: Endoscopy;  Laterality: N/A;  12:45pm   HYDROCELE EXCISION Right 05/24/2020   Procedure: HYDROCELECTOMY ADULT;  Surgeon:  Marco Severs, MD;  Location: [facility];  Service: Urology;  Laterality: Right;   INGUINAL HERNIA REPAIR Right    POLYPECTOMY  03/27/2020   Procedure: POLYPECTOMY INTESTINAL;  Surgeon: Vinetta Greening, DO;  Location: [facility];  Service: Endoscopy;;   VASECTOMY      Current Outpatient Medications on File Prior to Visit  Medication Sig Dispense Refill   aspirin  EC 81 MG tablet Take 81 mg by mouth daily. Swallow whole.     PRESCRIPTION MEDICATION Eye drop for increased pressure in eye     rosuvastatin  (CRESTOR ) 40 MG tablet Take 1 tablet (40 mg total) by mouth daily. 30 tablet 5   timolol (TIMOPTIC) 0.5 % ophthalmic solution Place 1 drop into the left eye every morning.     No current facility-administered medications on file prior to visit.    Social History   Socioeconomic History   Marital status: Married    Spouse name: Not on file   Number of children: 2   Years of education: Not on file   Highest education level: Not on file  Occupational History   Occupation: Curator  Tobacco Use   Smoking status: Former   Smokeless tobacco: Former    Quit date: 02/05/2003  Vaping Use   Vaping status: Never Used  Substance and Sexual Activity   Alcohol use: Yes    Comment: 2-3 beer/day   Drug use: Never   Sexual activity: Not  on file  Other Topics Concern   Not on file  Social History Narrative   Not on file   Social Drivers of Health   Financial Resource Strain: Not on file  Food Insecurity: Not on file  Transportation Needs: Not on file  Physical Activity: Not on file  Stress: Not on file  Social Connections: Not on file  Intimate Partner Violence: Not on file    Family History  Problem Relation Age of Onset   Cancer Mother        breast   Diabetes Mother    Hypertension Father    Heart disease Father    COPD Father    Stroke Father    Colon cancer Neg Hx     BP (!) 144/80   Pulse (!) 51   Ht 5' 5 (1.651 m)   Wt 162 lb (73.5 kg)   BMI 26.96 kg/m    Body mass index is 26.96 kg/m.      Objective:   Physical Exam Vitals and nursing note reviewed. Exam conducted with a chaperone present.  Constitutional:      Appearance: He is well-developed.  HENT:     Head: Normocephalic and atraumatic.   Eyes:     Conjunctiva/sclera: Conjunctivae normal.     Pupils: Pupils are equal, round, and reactive to light.    Cardiovascular:     Rate and Rhythm: Normal rate and regular rhythm.  Pulmonary:     Effort: Pulmonary effort is normal.  Abdominal:     Palpations: Abdomen is soft.   Musculoskeletal:       Arms:     Cervical back: Normal range of motion and neck supple.   Skin:    General: Skin is warm and dry.   Neurological:     Mental Status: He is alert and oriented to person, place, and time.     Cranial Nerves: No cranial nerve deficit.     Motor: No abnormal muscle tone.     Coordination: Coordination normal.     Deep Tendon Reflexes: Reflexes are normal and symmetric. Reflexes normal.   Psychiatric:        Behavior: Behavior normal.        Thought Content: Thought content normal.        Judgment: Judgment normal.           Assessment & Plan:   Encounter Diagnosis  Name Primary?   Chronic right shoulder pain Yes   I am concerned about a rotator cuff injury.  He has not improved with conservative treatment over the last eight to nine months.  I would like to get a MRI.  Return in two weeks.  Call if any problem.  Precautions discussed.  Electronically Signed Pleasant Brilliant, MD 6/18/202511:04 AM

## 2023-09-02 NOTE — Patient Instructions (Signed)
 Follow up with Dr,Keeling in 2 weeks. MRI has been placed at [facility]. Please call to schedule appt if has not heard within a week. [phone].

## 2023-09-03 ENCOUNTER — Ambulatory Visit (HOSPITAL_COMMUNITY)

## 2023-09-03 ENCOUNTER — Telehealth: Payer: Self-pay | Admitting: Orthopaedic Surgery

## 2023-09-03 NOTE — Telephone Encounter (Signed)
 Order ID: [id number]       Completed   Approval Valid Through: 09/03/2023 - 10/02/2023

## 2023-09-03 NOTE — Telephone Encounter (Signed)
 Dr. Vicente Graham pt - Ronnie Walker w/[facility] [phone] ext [phone], stated that the pt was at [facility] right now for his MRI, his insurance requires an authorization and there's not one on file.  She stated that she was also going to email Sabrina.

## 2023-09-03 NOTE — Telephone Encounter (Signed)
 Dr. Vicente Graham pt - spoke w/the pt as well, he is still at [facility].  He is going to rs his MRI and would like to get the authorization piece straightened out tomorrow.

## 2023-09-04 ENCOUNTER — Other Ambulatory Visit: Payer: Self-pay

## 2023-09-07 ENCOUNTER — Ambulatory Visit (HOSPITAL_COMMUNITY)
Admission: RE | Admit: 2023-09-07 | Discharge: 2023-09-07 | Disposition: A | Discharge status: Home / Self Care | Source: Ambulatory Visit | Attending: Orthopaedic Surgery | Admitting: Orthopaedic Surgery

## 2023-09-07 DIAGNOSIS — S46811A Strain of other muscles, fascia and tendons at shoulder and upper arm level, right arm, initial encounter: Secondary | ICD-10-CM | POA: Diagnosis not present

## 2023-09-07 DIAGNOSIS — G8929 Other chronic pain: Secondary | ICD-10-CM | POA: Insufficient documentation

## 2023-09-07 DIAGNOSIS — M25511 Pain in right shoulder: Secondary | ICD-10-CM | POA: Diagnosis not present

## 2023-09-07 DIAGNOSIS — M75121 Complete rotator cuff tear or rupture of right shoulder, not specified as traumatic: Secondary | ICD-10-CM | POA: Diagnosis not present

## 2023-09-07 DIAGNOSIS — M7580 Other shoulder lesions, unspecified shoulder: Secondary | ICD-10-CM | POA: Diagnosis not present

## 2023-09-07 DIAGNOSIS — M75101 Unspecified rotator cuff tear or rupture of right shoulder, not specified as traumatic: Secondary | ICD-10-CM | POA: Diagnosis not present

## 2023-09-16 ENCOUNTER — Ambulatory Visit: Admitting: Orthopaedic Surgery

## 2023-09-16 ENCOUNTER — Encounter: Payer: Self-pay | Admitting: Orthopaedic Surgery

## 2023-09-16 VITALS — BP 176/90 | HR 80 | Ht 65.0 in | Wt 162.0 lb

## 2023-09-16 DIAGNOSIS — M25511 Pain in right shoulder: Secondary | ICD-10-CM | POA: Diagnosis not present

## 2023-09-16 DIAGNOSIS — G8929 Other chronic pain: Secondary | ICD-10-CM | POA: Diagnosis not present

## 2023-09-16 NOTE — Patient Instructions (Signed)
 Referral has been placed to see Dr. Onesimo to discuss shoulder surgery. Someone will schedule and let you know of appt.

## 2023-09-16 NOTE — Progress Notes (Signed)
 My shoulder hurts.  He had MRI of the right shoulder showing: IMPRESSION: AC joint arthrosis and superior migration of the humeral head.   Full-thickness supraspinatus and infraspinatus tendon tear with retraction and fatty atrophy of the muscles.   Attenuated biceps tendon which could reflect a high-grade partial tear with minimal intact fibers. Correlation for biceps tendon symptoms. The intra-articular segment is not well seen. There is blunting and abnormal signal of the superior labrum and posterior labrum consistent with chronic degenerative changes. No displaced labral tear is identified.  I have explained the findings to him.  I will have him see Dr. Onesimo.  I have independently reviewed the MRI.    He likes to bass fish.  He is planning retirement in November or December.  He is learning positions to avoid.  ROM of the shoulder is good with pain in the extremes.  Internal rotation against resistance is good but very poor against external rotation with resistance.  NV intact.  He has no swelling or redness.  Grips are normal.  Encounter Diagnosis  Name Primary?   Chronic right shoulder pain Yes   I will have him see Dr. Onesimo.  Call if any problem.  Precautions discussed.  Electronically Signed Lemond Stable, MD 7/2/20253:24 PM

## 2023-10-06 ENCOUNTER — Encounter: Payer: Self-pay | Admitting: Orthopedic Surgery

## 2023-10-06 ENCOUNTER — Ambulatory Visit: Admitting: Orthopedic Surgery

## 2023-10-06 VITALS — BP 177/100 | HR 54 | Ht 65.0 in | Wt 166.0 lb

## 2023-10-06 DIAGNOSIS — M75121 Complete rotator cuff tear or rupture of right shoulder, not specified as traumatic: Secondary | ICD-10-CM

## 2023-10-06 DIAGNOSIS — G8929 Other chronic pain: Secondary | ICD-10-CM | POA: Diagnosis not present

## 2023-10-06 DIAGNOSIS — M25511 Pain in right shoulder: Secondary | ICD-10-CM | POA: Diagnosis not present

## 2023-10-06 NOTE — Progress Notes (Signed)
 New Patient Visit  Assessment: Ronnie Walker is a 73 y.o. male with the following: 1. Chronic right shoulder pain 2. Complete tear of right rotator cuff, unspecified whether traumatic  Plan: Ronnie Walker has pain and slightly restricted range of motion of the right shoulder.  This is been ongoing for about 6 months.  MRI demonstrates full-thickness tears of the supraspinatus and infraspinatus with atrophy and retraction.  I think he would benefit from reverse shoulder arthroplasty.  This was discussed with the patient.  He is interested in proceeding.  Procedure has been discussed.  I have provided pamphlets.  Will need to obtain medical clearance  We will need to obtain a CT scan for preoperative planning.  Once these are available, we will work to finalize a surgical date.  He will obtain documents for work in preparation for his time off.  Risks and benefits of the surgery, including, but not limited to infection, bleeding, persistent pain, need for further surgery, location, implant loosening, stiffness and more severe complications associated with anesthesia were discussed with the patient.  The patient has elected to proceed.   Follow-up: Return for After medical clearance for OR.  Subjective:  Chief Complaint  Patient presents with   Shoulder Pain    R shoulder     History of Present Illness: Ronnie Walker is a 73 y.o. male who presents for evaluation of right shoulder pain.  He states has had pain in the right shoulder for about 6 months.  He was lifting an object, and went to lift it over his head.  He started to have immediate pain.  His motion gradually improved, but he continued to have pain.  At this point, his motion is much better, but he does have pain.  He has been evaluated by Dr. Brenna.  He has obtained an MRI.  He has not had an injection.  He continues to work.  He is planning to retire within the next 6-8 months.   Review of Systems: No fevers or  chills No numbness or tingling No chest pain No shortness of breath No bowel or bladder dysfunction No GI distress No headaches   Medical History:  Past Medical History:  Diagnosis Date   CVA (cerebral vascular accident) (HCC)    08/2019   Hyperlipidemia     Past Surgical History:  Procedure Laterality Date   BACK SURGERY     COLONOSCOPY WITH PROPOFOL  N/A 03/27/2020   Procedure: COLONOSCOPY WITH PROPOFOL ;  Surgeon: Cindie Carlin POUR, DO;  Location: [facility];  Service: Endoscopy;  Laterality: N/A;  12:45pm   HYDROCELE EXCISION Right 05/24/2020   Procedure: HYDROCELECTOMY ADULT;  Surgeon: Sherrilee Belvie CROME, MD;  Location: [facility];  Service: Urology;  Laterality: Right;   INGUINAL HERNIA REPAIR Right    POLYPECTOMY  03/27/2020   Procedure: POLYPECTOMY INTESTINAL;  Surgeon: Cindie Carlin POUR, DO;  Location: [facility];  Service: Endoscopy;;   VASECTOMY      Family History  Problem Relation Age of Onset   Cancer Mother        breast   Diabetes Mother    Hypertension Father    Heart disease Father    COPD Father    Stroke Father    Colon cancer Neg Hx    Social History   Tobacco Use   Smoking status: Former   Smokeless tobacco: Former    Quit date: 02/05/2003  Vaping Use   Vaping status: Never Used  Substance  Use Topics   Alcohol use: Yes    Comment: 2-3 beer/day   Drug use: Never    No Known Allergies  Current Meds  Medication Sig   aspirin  EC 81 MG tablet Take 81 mg by mouth daily. Swallow whole.   PRESCRIPTION MEDICATION Eye drop for increased pressure in eye   rosuvastatin  (CRESTOR ) 40 MG tablet Take 1 tablet (40 mg total) by mouth daily.   timolol (TIMOPTIC) 0.5 % ophthalmic solution Place 1 drop into the left eye every morning.    Objective: BP (!) 177/100   Pulse (!) 54   Ht 5' 5 (1.651 m)   Wt 166 lb (75.3 kg)   BMI 27.62 kg/m   Physical Exam:  General: Alert and oriented. and No acute distress. Gait: Normal gait.  Right shoulder  without deformity.  Sensation intact in the axillary nerve distribution.  He has near full range of motion, with some discomfort.  Some pain with strength testing.  Fingers warm well-perfused.   IMAGING: I personally reviewed images previously obtained in clinic  IMPRESSION: AC joint arthrosis and superior migration of the humeral head.   Full-thickness supraspinatus and infraspinatus tendon tear with retraction and fatty atrophy of the muscles.   Attenuated biceps tendon which could reflect a high-grade partial tear with minimal intact fibers. Correlation for biceps tendon symptoms. The intra-articular segment is not well seen. There is blunting and abnormal signal of the superior labrum and posterior labrum consistent with chronic degenerative changes. No displaced labral tear is identified.   New Medications:  No orders of the defined types were placed in this encounter.     Ronnie DELENA Horde, MD  10/06/2023 9:53 AM

## 2023-10-06 NOTE — Patient Instructions (Signed)
 Preoperative Instructions  Your surgery will be at [facility], scheduled with Dr Oneil Horde   The hospital will contact you with a preoperative appointment to discuss Anesthesia. The phone number is [phone]   Please bring your medications with you for the appointment.  They will tell you the arrival time and medication instructions when you have your preoperative evaluation.  Do not wear nail polish the day of your surgery and if you take Phentermine you need to stop this medication ONE WEEK prior to your surgery.    If you take an blood thinning medication, we will need to stop this prior to surgery.  Typically, we stop this medicine at least 5 days prior to surgery.  We will need to confirm this with the doctor who prescribes this medication.  If you are taking medications or an injection for diabetes, or for weight management, this medicine will need to be stopped at least 7 days prior to surgery.     Surgery will be scheduled pending authorization by your insurance company.  Will obtain a CT scan of your right shoulder.  We will need to confirm clearance for surgery with your primary care provider.   Pain medicine policy:  Per [facility] clinic policy, our goal is ensure optimal postoperative pain control with a multimodal pain management strategy.   For all [facility] patients, our goal is to wean post-operative narcotic medications by 6 weeks post-operatively.   If this is not possible due to utilization of pain medication prior to surgery, your [facility] doctor will support your acute post-operative pain control for the first 6 weeks postoperatively, with a plan to transition you back to your primary pain team following that.   Maralee will work to ensure a Therapist, occupational.

## 2023-10-06 NOTE — Addendum Note (Signed)
 Addended by: VICENTA EMMIE HERO on: 10/06/2023 10:06 AM   Modules accepted: Orders

## 2023-10-23 ENCOUNTER — Ambulatory Visit (HOSPITAL_COMMUNITY)
Admission: RE | Admit: 2023-10-23 | Discharge: 2023-10-23 | Disposition: A | Discharge status: Home / Self Care | Source: Ambulatory Visit | Attending: Orthopedic Surgery | Admitting: Orthopedic Surgery

## 2023-10-23 DIAGNOSIS — M19011 Primary osteoarthritis, right shoulder: Secondary | ICD-10-CM | POA: Diagnosis not present

## 2023-10-23 DIAGNOSIS — G8929 Other chronic pain: Secondary | ICD-10-CM | POA: Insufficient documentation

## 2023-10-23 DIAGNOSIS — M75121 Complete rotator cuff tear or rupture of right shoulder, not specified as traumatic: Secondary | ICD-10-CM | POA: Insufficient documentation

## 2023-10-23 DIAGNOSIS — M25511 Pain in right shoulder: Secondary | ICD-10-CM | POA: Diagnosis not present

## 2023-10-29 ENCOUNTER — Ambulatory Visit: Admitting: Nurse Practitioner

## 2023-10-29 ENCOUNTER — Encounter: Payer: Self-pay | Admitting: Nurse Practitioner

## 2023-10-29 VITALS — BP 150/80 | HR 58 | Temp 97.8°F | Ht 65.0 in | Wt 166.4 lb

## 2023-10-29 DIAGNOSIS — Z01818 Encounter for other preprocedural examination: Secondary | ICD-10-CM | POA: Diagnosis not present

## 2023-10-29 DIAGNOSIS — E7849 Other hyperlipidemia: Secondary | ICD-10-CM

## 2023-10-29 DIAGNOSIS — R030 Elevated blood-pressure reading, without diagnosis of hypertension: Secondary | ICD-10-CM

## 2023-10-29 NOTE — Progress Notes (Addendum)
 Subjective:    Patient ID: Ronnie Walker, male    DOB: 02/28/1951, 73 y.o.   MRN: [id number]  HPI Discussed the use of AI scribe software for clinical note transcription with the patient, who gave verbal consent to proceed.  History of Present Illness Ronnie Walker is a 73 year old male who presents for preoperative evaluation for shoulder replacement surgery.  He is scheduled for shoulder replacement surgery, which will involve changing the structure of the cuff and inserting a new component.  He has a history of ischemic stroke, which he describes as a 'light stroke.' Since the event, he has not experienced any further stroke symptoms such as difficulty speaking, swallowing, or numbness and weakness in the face, arms, or legs. He does not take daily aspirin  consistently.  He monitors his blood pressure at home and at work, with readings around 125-130/60-70 mmHg. However, he notes elevated blood pressure during office visits, particularly since July, with readings in the 140s. He attributes recent spikes to dietary choices, including consuming bacon and beer.  He has a history of elevated cholesterol and triglycerides, with recent lab work showing triglycerides above 200 mg/dL for the first time in three years. He attributes this to dietary factors, including soft drinks, and has since reduced his intake of sugary beverages. His HDL cholesterol remains above 50 mg/dL.  No current symptoms such as chest pain, shortness of breath, swelling in the legs, or visual changes. He reports a recent change in his eyeglass prescription but no loss of vision.  He remains active and continues to work, mentioning interactions with coworkers and family members at his workplace. He does not smoke or use marijuana and consumes alcohol occasionally, noting recent beer consumption.   Review of Systems  Respiratory:  Negative for cough, chest tightness and shortness of breath.   Cardiovascular:  Negative  for chest pain and leg swelling.  Neurological:  Negative for syncope, facial asymmetry, speech difficulty, weakness, light-headedness, numbness and headaches.      10/29/2023   10:16 AM  Depression screen PHQ 2/9  Decreased Interest 0  Down, Depressed, Hopeless 0  PHQ - 2 Score 0  Altered sleeping 0  Tired, decreased energy 0  Change in appetite 0  Feeling bad or failure about yourself  0  Trouble concentrating 0  Moving slowly or fidgety/restless 0  Suicidal thoughts 0  PHQ-9 Score 0  Difficult doing work/chores Not difficult at all      10/29/2023   10:17 AM 04/07/2023   10:58 AM 07/09/2022    8:33 AM  GAD 7 : Generalized Anxiety Score  Nervous, Anxious, on Edge 0 0 0  Control/stop worrying 0 0 0  Worry too much - different things 0 0 0  Trouble relaxing 0 0 0  Restless 0 0 0  Easily annoyed or irritable 0 0 0  Afraid - awful might happen 0 0 0  Total GAD 7 Score 0 0 0  Anxiety Difficulty Not difficult at all Not difficult at all     Social History   Tobacco Use   Smoking status: Former   Smokeless tobacco: Former    Quit date: 02/05/2003  Vaping Use   Vaping status: Never Used  Substance Use Topics   Alcohol use: Yes    Comment: 2-3 beer/day   Drug use: Never        Objective:   Physical Exam Vitals and nursing note reviewed.  Cardiovascular:     Rate and  Rhythm: Normal rate and regular rhythm.     Heart sounds: Normal heart sounds.     Comments: Carotids no bruits or thrills. Pulmonary:     Effort: Pulmonary effort is normal.     Breath sounds: Normal breath sounds.  Neurological:     Mental Status: He is alert and oriented to person, place, and time.  Psychiatric:        Mood and Affect: Mood normal.        Behavior: Behavior normal.        Thought Content: Thought content normal.        Judgment: Judgment normal.    Today's Vitals   10/29/23 1008 10/29/23 1035  BP: (!) 170/82 (!) 150/80  Pulse: (!) 58   Temp: 97.8 F (36.6 C)   SpO2:  98%   Weight: 166 lb 6 oz (75.5 kg)   Height: 5' 5 (1.651 m)    Body mass index is 27.69 kg/m.        Assessment & Plan:  Assessment and Plan Assessment & Plan Problem List Items Addressed This Visit       Other   Elevated blood pressure reading   Hyperlipidemia   Other Visit Diagnoses       Pre-op examination    -  Primary         Preoperative evaluation for right shoulder arthroplasty Scheduled for right shoulder arthroplasty with anticipated improvement. - Send preoperative clearance note to the surgeon.  Elevated BP Office blood pressure elevated; home/work readings normal. Recent diet may contribute. - Advise dietary modifications to reduce sodium and alcohol intake.   Hyperlipidemia Elevated cholesterol and triglycerides, likely dietary. Favorable HDL. Follow-up with Doctor Glendia planned. - Advise dietary modifications to improve lipid profile.  Return for Follow up 11/4 as planned.

## 2023-10-30 ENCOUNTER — Encounter: Payer: Self-pay | Admitting: Nurse Practitioner

## 2023-11-06 ENCOUNTER — Encounter: Payer: Self-pay | Admitting: Radiology

## 2023-11-23 ENCOUNTER — Telehealth: Payer: Self-pay | Admitting: Family Medicine

## 2023-11-23 DIAGNOSIS — M25511 Pain in right shoulder: Secondary | ICD-10-CM

## 2023-11-23 NOTE — Telephone Encounter (Signed)
 Patient sent message through call center stating he need a referral to orthopedic in [place] where his insurance will cover . He states can't see Dr. Danne his insurance doesn't cover him. I sent message to Charmaine Penton ,she sent back stating need new referral put in for [facility] orthopedic.

## 2023-11-23 NOTE — Telephone Encounter (Signed)
 May have referral for right shoulder pain recommend orthopedist that is covered under his insurance

## 2023-11-24 NOTE — Telephone Encounter (Signed)
New ortho referral placed.

## 2023-12-09 ENCOUNTER — Telehealth: Payer: Self-pay | Admitting: Orthopedic Surgery

## 2023-12-09 NOTE — Telephone Encounter (Signed)
 Spoke w/the pt this morning, he stated that he is not going to be able to let Dr. JAYSON do his surgery bc he is out of network for his ins.  He is going to get a list from his ins of providers in network and bring by for Dr. C to review and maybe suggest one from the list.

## 2023-12-13 ENCOUNTER — Telehealth: Payer: Self-pay | Admitting: Orthopedic Surgery

## 2023-12-13 NOTE — Telephone Encounter (Signed)
 This patient presented to the office on 12/10/23 stating that Dr. Onesimo was not listed as in network for his [employer].  He brought a list of doctors that are in his network and wanted Dr. JAYSON to review and recommend.  I left this for Dr. JAYSON and he stated:  Medicare Part A is primary coverage BCBS is secondary coverage I saw him in clinic on 10/06/23; was that visit not covered? Why are MD's at [facility] on te list, but I am not?  Mark  Help, I have no idea.

## 2023-12-14 ENCOUNTER — Telehealth: Payer: Self-pay | Admitting: Orthopedic Surgery

## 2023-12-14 NOTE — Telephone Encounter (Signed)
 Dr. Onesimo pt - spoke w/the pt, he contacted [employer] again, they error was they said our charges were billed under ENT and not Ortho, per the pt, that has been corrected.  Dr. Onesimo is in network for the pt's insurance.  He would like to setup a surgery date.

## 2023-12-23 ENCOUNTER — Ambulatory Visit: Admitting: Orthopedic Surgery

## 2023-12-23 ENCOUNTER — Encounter: Payer: Self-pay | Admitting: Orthopedic Surgery

## 2023-12-23 VITALS — BP 125/66 | Ht 65.0 in | Wt 166.0 lb

## 2023-12-23 DIAGNOSIS — M75121 Complete rotator cuff tear or rupture of right shoulder, not specified as traumatic: Secondary | ICD-10-CM | POA: Diagnosis not present

## 2023-12-23 NOTE — Progress Notes (Unsigned)
 New Patient Visit  Assessment: Ronnie Walker is a 73 y.o. male with the following: 1. Chronic right shoulder pain 2. Complete tear of right rotator cuff, unspecified whether traumatic  Plan: Ronnie Walker has pain and slightly restricted range of motion of the right shoulder.  This is been ongoing for about 6 months.  MRI demonstrates full-thickness tears of the supraspinatus and infraspinatus with atrophy and retraction.  I think Ronnie Walker would benefit from reverse shoulder arthroplasty.  This was discussed with the patient.  Ronnie Walker is interested in proceeding.  Procedure has been discussed.  I have provided pamphlets.  Will need to obtain medical clearance  We will need to obtain a CT scan for preoperative planning.  Once these are available, we will work to finalize a surgical date.  Ronnie Walker will obtain documents for work in preparation for his time off.  Risks and benefits of the surgery, including, but not limited to infection, bleeding, persistent pain, need for further surgery, location, implant loosening, stiffness and more severe complications associated with anesthesia were discussed with the patient.  The patient has elected to proceed.   Follow-up: No follow-ups on file.  Subjective:  Chief Complaint  Patient presents with   Shoulder Pain    Right / had a CT scan of shoulder / ready to proceed with surgery     History of Present Illness: Ronnie Walker is a 73 y.o. male who presents for evaluation of right shoulder pain.  Ronnie Walker states has had pain in the right shoulder for about 6 months.  Ronnie Walker was lifting an object, and went to lift it over his head.  Ronnie Walker started to have immediate pain.  His motion gradually improved, but Ronnie Walker continued to have pain.  At this point, his motion is much better, but Ronnie Walker does have pain.  Ronnie Walker has been evaluated by Dr. Brenna.  Ronnie Walker has obtained an MRI.  Ronnie Walker has not had an injection.  Ronnie Walker continues to work.  Ronnie Walker is planning to retire within the next 6-8 months.   Review  of Systems: No fevers or chills No numbness or tingling No chest pain No shortness of breath No bowel or bladder dysfunction No GI distress No headaches   Medical History:  Past Medical History:  Diagnosis Date   CVA (cerebral vascular accident) (HCC)    08/2019   Hyperlipidemia     Past Surgical History:  Procedure Laterality Date   BACK SURGERY     COLONOSCOPY WITH PROPOFOL  N/A 03/27/2020   Procedure: COLONOSCOPY WITH PROPOFOL ;  Surgeon: Cindie Carlin POUR, DO;  [facility]: [facility];  Service: Endoscopy;  Laterality: N/A;  12:45pm   HYDROCELE EXCISION Right 05/24/2020   Procedure: HYDROCELECTOMY ADULT;  Surgeon: Sherrilee Belvie CROME, MD;  Location: [facility];  Service: Urology;  Laterality: Right;   INGUINAL HERNIA REPAIR Right    POLYPECTOMY  03/27/2020   Procedure: POLYPECTOMY INTESTINAL;  Surgeon: Cindie Carlin POUR, DO;  Location: [facility];  Service: Endoscopy;;   VASECTOMY      Family History  Problem Relation Age of Onset   Cancer Mother        breast   Diabetes Mother    Hypertension Father    Heart disease Father    COPD Father    Stroke Father    Colon cancer Neg Hx    Social History   Tobacco Use   Smoking status: Former   Smokeless tobacco: Former    Quit date: 02/05/2003  Vaping  Use   Vaping status: Never Used  Substance Use Topics   Alcohol use: Yes    Comment: 2-3 beer/day   Drug use: Never    No Known Allergies  No outpatient medications have been marked as taking for the 12/23/23 encounter (Office Visit) with Onesimo Oneil LABOR, MD.    Objective: BP 125/66 Comment: at home this morning  Ht 5' 5 (1.651 m)   Wt 166 lb (75.3 kg)   BMI 27.62 kg/m   Physical Exam:  General: Alert and oriented. and No acute distress. Gait: Normal gait.  Right shoulder without deformity.  Sensation intact in the axillary nerve distribution.  Ronnie Walker has near full range of motion, with some discomfort.  Some pain with strength testing.  Fingers warm  well-perfused.   IMAGING: I personally reviewed images previously obtained in clinic  IMPRESSION: AC joint arthrosis and superior migration of the humeral head.   Full-thickness supraspinatus and infraspinatus tendon tear with retraction and fatty atrophy of the muscles.   Attenuated biceps tendon which could reflect a high-grade partial tear with minimal intact fibers. Correlation for biceps tendon symptoms. The intra-articular segment is not well seen. There is blunting and abnormal signal of the superior labrum and posterior labrum consistent with chronic degenerative changes. No displaced labral tear is identified.   New Medications:  No orders of the defined types were placed in this encounter.     Oneil LABOR Onesimo, MD  12/23/2023 2:09 PM

## 2023-12-23 NOTE — H&P (View-Only) (Signed)
 New Patient Visit  Assessment: AKUL LEGGETTE is a 73 y.o. male with the following: 1. Chronic right shoulder pain 2. Complete tear of right rotator cuff, unspecified whether traumatic  Plan: Dasie JONELLE Dustman has pain and slightly restricted range of motion of the right shoulder.  This is been ongoing for about 6 months.  MRI demonstrates full-thickness tears of the supraspinatus and infraspinatus with atrophy and retraction.  I think he would benefit from reverse shoulder arthroplasty.  This was discussed with the patient.  He is interested in proceeding.  Procedure has been discussed.  I have provided pamphlets.  Will need to obtain medical clearance  We will need to obtain a CT scan for preoperative planning.  Once these are available, we will work to finalize a surgical date.  He will obtain documents for work in preparation for his time off.  Risks and benefits of the surgery, including, but not limited to infection, bleeding, persistent pain, need for further surgery, location, implant loosening, stiffness and more severe complications associated with anesthesia were discussed with the patient.  The patient has elected to proceed.   Follow-up: No follow-ups on file.  Subjective:  Chief Complaint  Patient presents with   Shoulder Pain    Right / had a CT scan of shoulder / ready to proceed with surgery     History of Present Illness: KIPP SHANK is a 73 y.o. male who presents for evaluation of right shoulder pain.  He states has had pain in the right shoulder for about 6 months.  He was lifting an object, and went to lift it over his head.  He started to have immediate pain.  His motion gradually improved, but he continued to have pain.  At this point, his motion is much better, but he does have pain.  He has been evaluated by Dr. Brenna.  He has obtained an MRI.  He has not had an injection.  He continues to work.  He is planning to retire within the next 6-8 months.   Review  of Systems: No fevers or chills No numbness or tingling No chest pain No shortness of breath No bowel or bladder dysfunction No GI distress No headaches   Medical History:  Past Medical History:  Diagnosis Date   CVA (cerebral vascular accident) (HCC)    08/2019   Hyperlipidemia     Past Surgical History:  Procedure Laterality Date   BACK SURGERY     COLONOSCOPY WITH PROPOFOL  N/A 03/27/2020   Procedure: COLONOSCOPY WITH PROPOFOL ;  Surgeon: Cindie Carlin POUR, DO;  [facility]: [facility];  Service: Endoscopy;  Laterality: N/A;  12:45pm   HYDROCELE EXCISION Right 05/24/2020   Procedure: HYDROCELECTOMY ADULT;  Surgeon: Sherrilee Belvie CROME, MD;  Location: [facility];  Service: Urology;  Laterality: Right;   INGUINAL HERNIA REPAIR Right    POLYPECTOMY  03/27/2020   Procedure: POLYPECTOMY INTESTINAL;  Surgeon: Cindie Carlin POUR, DO;  [facility];  Service: Endoscopy;;   VASECTOMY      Family History  Problem Relation Age of Onset   Cancer Mother        breast   Diabetes Mother    Hypertension Father    Heart disease Father    COPD Father    Stroke Father    Colon cancer Neg Hx    Social History   Tobacco Use   Smoking status: Former   Smokeless tobacco: Former    Quit date: 02/05/2003  Vaping  Use   Vaping status: Never Used  Substance Use Topics   Alcohol use: Yes    Comment: 2-3 beer/day   Drug use: Never    No Known Allergies  No outpatient medications have been marked as taking for the 12/23/23 encounter (Office Visit) with Onesimo Oneil LABOR, MD.    Objective: BP 125/66 Comment: at home this morning  Ht 5' 5 (1.651 m)   Wt 166 lb (75.3 kg)   BMI 27.62 kg/m   Physical Exam:  General: Alert and oriented. and No acute distress. Gait: Normal gait.  Right shoulder without deformity.  Sensation intact in the axillary nerve distribution.  He has near full range of motion, with some discomfort.  Some pain with strength testing.  Fingers warm  well-perfused.   IMAGING: I personally reviewed images previously obtained in clinic  IMPRESSION: AC joint arthrosis and superior migration of the humeral head.   Full-thickness supraspinatus and infraspinatus tendon tear with retraction and fatty atrophy of the muscles.   Attenuated biceps tendon which could reflect a high-grade partial tear with minimal intact fibers. Correlation for biceps tendon symptoms. The intra-articular segment is not well seen. There is blunting and abnormal signal of the superior labrum and posterior labrum consistent with chronic degenerative changes. No displaced labral tear is identified.   New Medications:  No orders of the defined types were placed in this encounter.     Oneil LABOR Onesimo, MD  12/23/2023 2:09 PM

## 2023-12-23 NOTE — Patient Instructions (Signed)
 Preoperative Instructions  Your surgery will be at [facility], scheduled with Dr Oneil Horde   The hospital will contact you with a preoperative appointment to discuss Anesthesia. The phone number is [phone]   Please bring your medications with you for the appointment.  They will tell you the arrival time and medication instructions when you have your preoperative evaluation.  Do not wear nail polish the day of your surgery and if you take Phentermine you need to stop this medication ONE WEEK prior to your surgery.    If you take an blood thinning medication, we will need to stop this prior to surgery.  Typically, we stop this medicine at least 5 days prior to surgery.  We will need to confirm this with the doctor who prescribes this medication.  If you are taking medications or an injection for diabetes, or for weight management, this medicine will need to be stopped at least 7 days prior to surgery.     Surgery will be scheduled for 01/14/2024 pending authorization by your insurance company.   Pain medicine policy:  Per [facility] clinic policy, our goal is ensure optimal postoperative pain control with a multimodal pain management strategy.   For all [facility] patients, our goal is to wean post-operative narcotic medications by 6 weeks post-operatively.   If this is not possible due to utilization of pain medication prior to surgery, your [facility] doctor will support your acute post-operative pain control for the first 6 weeks postoperatively, with a plan to transition you back to your primary pain team following that.   Maralee will work to ensure a Therapist, occupational.

## 2023-12-28 ENCOUNTER — Encounter: Payer: Self-pay | Admitting: Nurse Practitioner

## 2023-12-28 ENCOUNTER — Ambulatory Visit (INDEPENDENT_AMBULATORY_CARE_PROVIDER_SITE_OTHER): Admitting: Nurse Practitioner

## 2023-12-28 VITALS — BP 139/83 | HR 57 | Temp 97.3°F | Ht 65.0 in | Wt 164.0 lb

## 2023-12-28 DIAGNOSIS — Z23 Encounter for immunization: Secondary | ICD-10-CM | POA: Diagnosis not present

## 2023-12-28 DIAGNOSIS — Z01818 Encounter for other preprocedural examination: Secondary | ICD-10-CM | POA: Diagnosis not present

## 2023-12-28 DIAGNOSIS — R030 Elevated blood-pressure reading, without diagnosis of hypertension: Secondary | ICD-10-CM | POA: Diagnosis not present

## 2023-12-28 NOTE — Progress Notes (Signed)
 Subjective:    Patient ID: Ronnie Walker, male    DOB: [birth year], 73 y.o.   MRN: [id number]  HPI Discussed the use of AI scribe software for clinical note transcription with the patient, who gave verbal consent to proceed.  History of Present Illness Ronnie Walker is a 73 year old male with a history of acute ischemic stroke who presents for preoperative clearance.  Recent blood pressure readings include 147/70, 152/72, and 145/69 at home recently. He notes that his blood pressure tends to increase slightly before medical visits, possibly due to anxiety, but at home, readings are sometimes as low as 125/60.  He has a history of an acute ischemic stroke several years ago and is currently on cholesterol medication. He does not take aspirin  daily due to easy bruising.  He does not smoke or use nicotine products. His diet includes processed foods and drinks like Sprite Zero, which may contain sodium. He is aware that sodium intake, stress, and lack of sleep can affect his blood pressure.  No chest pain, shortness of breath, or swelling in his legs. He reports occasional sinus drainage and a runny nostril, which he attributes to eye drops or weather changes. No recent fever, severe symptoms, or changes in vision, speech, or swallowing. He occasionally experiences a cough when clearing his throat but denies consistent coughing or wheezing.    Review of Systems  HENT:  Positive for congestion and rhinorrhea. Negative for ear pain and sore throat.   Respiratory:  Negative for cough, chest tightness, shortness of breath and wheezing.   Cardiovascular:  Negative for chest pain and leg swelling.  Neurological:  Negative for syncope, facial asymmetry, speech difficulty, weakness and numbness.      12/28/2023   10:58 AM  Depression screen PHQ 2/9  Decreased Interest 0  Down, Depressed, Hopeless 0  PHQ - 2 Score 0  Altered sleeping 0  Tired, decreased energy 0  Change in appetite 0  Feeling  bad or failure about yourself  0  Trouble concentrating 0  Moving slowly or fidgety/restless 0  Suicidal thoughts 0  PHQ-9 Score 0  Difficult doing work/chores Not difficult at all      10/29/2023   10:17 AM 04/07/2023   10:58 AM 07/09/2022    8:33 AM  GAD 7 : Generalized Anxiety Score  Nervous, Anxious, on Edge 0 0 0  Control/stop worrying 0 0 0  Worry too much - different things 0 0 0  Trouble relaxing 0 0 0  Restless 0 0 0  Easily annoyed or irritable 0 0 0  Afraid - awful might happen 0 0 0  Total GAD 7 Score 0 0 0  Anxiety Difficulty Not difficult at all Not difficult at all     Social History   Tobacco Use   Smoking status: Former   Smokeless tobacco: Former    Quit date: 02/05/2003  Vaping Use   Vaping status: Never Used  Substance Use Topics   Alcohol use: Yes    Comment: 2-3 beer/day   Drug use: Never        Objective:   Physical Exam Vitals and nursing note reviewed.  Constitutional:      General: He is not in acute distress. HENT:     Ears:     Comments: TMs mild clear effusion, no erythema.     Mouth/Throat:     Mouth: Mucous membranes are moist.     Pharynx: Oropharynx is clear.  Neck:  Comments: Mild non tender anterior cervical adenopathy.  Cardiovascular:     Rate and Rhythm: Normal rate and regular rhythm.     Heart sounds: Normal heart sounds.     Comments: Carotids no bruits or thrills. Pulmonary:     Effort: Pulmonary effort is normal.     Breath sounds: Normal breath sounds.  Musculoskeletal:     Cervical back: Neck supple.     Right lower leg: No edema.     Left lower leg: No edema.  Lymphadenopathy:     Cervical: Cervical adenopathy present.  Neurological:     Mental Status: He is alert and oriented to person, place, and time.  Psychiatric:        Mood and Affect: Mood normal.        Behavior: Behavior normal.        Thought Content: Thought content normal.        Judgment: Judgment normal.    Today's Vitals    12/28/23 1005 12/28/23 1036  BP: (!) 160/80 139/83  Pulse: (!) 57   Temp: (!) 97.3 F (36.3 C)   SpO2: 100%   Weight: 164 lb (74.4 kg)   Height: 5' 5 (1.651 m)    Body mass index is 27.29 kg/m.         Assessment & Plan:  1. Pre-op examination (Primary) Cleared for shoulder surgery. Pre op form will be sent to his surgeon Recommend OTC products such as Flonase and Mucinex DM for congestion. Call back if worsens or persists.   2. Immunization due  - Flu vaccine HIGH DOSE PF(Fluzone Trivalent)  3. Elevated blood pressure reading Blood pressure elevated, systolic 140-152 mmHg. Contributing factors: anxiety, lifestyle, hereditary, aging. Goal: reduce systolic to =130 mmHg to prevent complications. - Consider low-dose ARB like valsartan for kidney protection. - Monitor blood pressure at various times, report readings. - Reduce sodium intake, avoid processed foods and high-sodium beverages.  Return for Follow up with Dr. Glendia as planned.

## 2023-12-29 ENCOUNTER — Encounter: Payer: Self-pay | Admitting: Nurse Practitioner

## 2024-01-08 NOTE — Patient Instructions (Signed)
 Ronnie Walker  01/08/2024     @PREFPERIOPPHARMACY @   Your procedure is scheduled on  01/14/2024.   Report to [facility] at 0600 A.M.   Call this number if you have problems the morning of surgery:  [phone]  If you experience any cold or flu symptoms such as cough, fever, chills, shortness of breath, etc. between now and your scheduled surgery, please notify us  at the above number.   Remember:  Do not eat after midnight.   You may drink clear liquids until  0330 am on 01/14/2024.       Clear liquids allowed are:                    Water, Juice (No red color; non-citric and without pulp; diabetics please choose diet or no sugar options), Carbonated beverages (diabetics please choose diet or no sugar options), Clear Tea (No creamer, milk, or cream, including half & half and powdered creamer), Black Coffee Only (No creamer, milk or cream, including half & half and powdered creamer), and Clear Sports drink (No red color; diabetics please choose diet or no sugar options)          At 0330 am on 01/14/2024 drink your carb drink. You can have nothing else after this.   Take these medicines the morning of surgery with A SIP OF WATER                                                         None.         Do not wear jewelry, make-up or nail polish, including gel polish,  artificial nails, or any other type of covering on natural nails (fingers and  toes).  Do not wear lotions, powders, or perfumes, or deodorant.  Do not shave 48 hours prior to surgery.  Men may shave face and neck.  Do not bring valuables to the hospital.  [facility] is not responsible for any belongings or valuables.  Contacts, dentures or bridgework may not be worn into surgery.  Leave your suitcase in the car.  After surgery it may be brought to your room.  For patients admitted to the hospital, discharge time will be determined by your treatment team.  Patients discharged the day of surgery will  not be allowed to drive home and must have someone with them for 24 hours.    Special instructions:   DO NOT smoke tobacco or vape for 24 hours before your procedure.  Please read over the following fact sheets that you were given. Coughing and Deep Breathing, Surgical Site Infection Prevention, Anesthesia Post-op Instructions, and Care and Recovery After Surgery       Reverse Total Shoulder Replacement Surgery: What to Know After After a reverse total shoulder replacement surgery, it's common to have pain and stiffness in your shoulder and arm. Follow these instructions at home: Medicines Take your medicines only as told. If you were given antibiotics, take them as told. Do not stop taking them even if you start to feel better. You may need to take steps to help treat or prevent trouble pooping (constipation), such as: Taking medicines to help you poop. Eating foods high in fiber, like beans, whole grains, and fresh fruits and vegetables. Drinking more fluids  as told. Ask your health care provider if it's safe to drive or use machines while taking your medicine. If you have a sling or immobilizer:  Wear the sling or immobilizer as told. Take it off only if your provider says you can. Check the skin around it every day. Tell your provider if you see problems. Loosen the sling if your fingers tingle, are numb, or turn cold and blue. Keep the sling or immobilizer clean and dry. Bathing Do not take baths, swim, or use a hot tub until you're told it's OK. Ask if you can shower. If your sling or immobilizer isn't waterproof: Do not let it get wet. Cover it when you take a bath or a shower. Use a cover that doesn't let any water in. Keep your bandage dry until your provider says you can take it off. Caring for your cut from surgery  Take care of your cut from surgery as told. Make sure you: Wash your hands with soap and water for at least 20 seconds before and after you change your  bandage. If you can't use soap and water, use hand sanitizer. Change your bandage. Leave stitches or skin glue alone. Leave tape strips alone unless you're told to take them off. You may trim the edges of the tape strips if they curl up. If you have a tube called a drain to remove extra fluid, care for it as told. Check the area around your cut every day for signs of infection. Check for: More redness, swelling, or pain. Fluid or blood. Warmth. Pus or a bad smell. Managing pain, stiffness, and swelling  Use ice or an ice pack as told. If you have a sling or immobilizer that you can take off, remove it only as told. Place a towel between your skin and the ice. Leave the ice on for 20 minutes, 2-3 times a day. If your skin turns red, take off the ice right away to prevent skin damage. The risk of damage is higher if you can't feel pain, heat, or cold. Move your fingers and hand often to reduce stiffness and swelling. Driving If you were given a sedative, do not drive or use machines until you're told it's safe. A sedative can make you sleepy. Ask when it's safe to drive if you have a sling or immobilizer on your arm. Activity Rest as told. Get up to take short walks many times during the day. This helps you breathe better and keeps your blood flowing. Ask for help if you feel weak or unsteady. Do not use your arm to push yourself up in bed or from a chair. Ask if it's OK for you to lift. Do not lift anything heavier than a cup of coffee for the first 6 weeks with the arm you had surgery on, or as told by your provider. Exercise as told. Try not to overuse your shoulder. Early overuse of the shoulder may result in later problems. Do not play contact sports. Ask what things are safe for you to do at home. Ask when you can go back to work or school. General instructions Do not smoke, vape, or use nicotine or tobacco. Do not have dental work or cleanings for at least 3 months. Ask your  provider if you need to take antibiotics before you have dental work or have your teeth cleaned. Tell your dentist about your new joint. Wear compression stockings to reduce swelling and prevent blood clots in your legs. Keep all follow-up  visits. Your provider will make sure you're healing. Contact a health care provider if: You have a fever. Your arm tingles or feels numb. Your pain gets worse, even after you take pain medicine. You have any signs of infection. Get help right away if: You have redness, swelling, pain, or warmth in your leg or arm. You have chest pain or shortness of breath. Your shoulder joint moves out of place. The edges of your cut break open after the stitches have been taken out. These symptoms may be an emergency. Call 911 right away. Do not wait to see if the symptoms will go away. Do not drive yourself to the hospital. This information is not intended to replace advice given to you by your health care provider. Make sure you discuss any questions you have with your health care provider. Document Revised: 02/16/2023 Document Reviewed: 02/16/2023 Elsevier Patient Education  2025 [employer]General Anesthesia, Adult, Care After The following information offers guidance on how to care for yourself after your procedure. Your health care provider may also give you more specific instructions. If you have problems or questions, contact your health care provider. What can I expect after the procedure? After the procedure, it is common for people to: Have pain or discomfort at the IV site. Have nausea or vomiting. Have a sore throat or hoarseness. Have trouble concentrating. Feel cold or chills. Feel weak, sleepy, or tired (fatigue). Have soreness and body aches. These can affect parts of the body that were not involved in surgery. Follow these instructions at home: For the time period you were told by your health care provider:  Rest. Do not participate in  activities where you could fall or become injured. Do not drive or use machinery. Do not drink alcohol. Do not take sleeping pills or medicines that cause drowsiness. Do not make important decisions or sign legal documents. Do not take care of children on your own. General instructions Drink enough fluid to keep your urine pale yellow. If you have sleep apnea, surgery and certain medicines can increase your risk for breathing problems. Follow instructions from your health care provider about wearing your sleep device: Anytime you are sleeping, including during daytime naps. While taking prescription pain medicines, sleeping medicines, or medicines that make you drowsy. Return to your normal activities as told by your health care provider. Ask your health care provider what activities are safe for you. Take over-the-counter and prescription medicines only as told by your health care provider. Do not use any products that contain nicotine or tobacco. These products include cigarettes, chewing tobacco, and vaping devices, such as e-cigarettes. These can delay incision healing after surgery. If you need help quitting, ask your health care provider. Contact a health care provider if: You have nausea or vomiting that does not get better with medicine. You vomit every time you eat or drink. You have pain that does not get better with medicine. You cannot urinate or have bloody urine. You develop a skin rash. You have a fever. Get help right away if: You have trouble breathing. You have chest pain. You vomit blood. These symptoms may be an emergency. Get help right away. Call 911. Do not wait to see if the symptoms will go away. Do not drive yourself to the hospital. Summary After the procedure, it is common to have a sore throat, hoarseness, nausea, vomiting, or to feel weak, sleepy, or fatigue. For the time period you were told by your health care provider, do not  drive or use machinery. Get  help right away if you have difficulty breathing, have chest pain, or vomit blood. These symptoms may be an emergency. This information is not intended to replace advice given to you by your health care provider. Make sure you discuss any questions you have with your health care provider. Document Revised: 05/31/2021 Document Reviewed: 05/31/2021 Elsevier Patient Education  2024 [employer].How to Use an Incentive Spirometer An incentive spirometer is a tool that measures how well you are filling your lungs with each breath. Learning to take long, deep breaths using this tool can help you keep your lungs clear and active. This may help to reverse or lessen your chance of developing breathing (pulmonary) problems, especially infection. You may be asked to use a spirometer: After a surgery. If you have a lung problem or a history of smoking. After a long period of time when you have been unable to move or be active. If the spirometer includes an indicator to show the highest number that you have reached, your health care provider or respiratory therapist will help you set a goal. Keep a log of your progress as told by your health care provider. What are the risks? Breathing too quickly may cause dizziness or cause you to pass out. Take your time so you do not get dizzy or light-headed. If you are in pain, you may need to take pain medicine before doing incentive spirometry. It is harder to take a deep breath if you are having pain. How to use your incentive spirometer  Sit up on the edge of your bed or on a chair. Hold the incentive spirometer so that it is in an upright position. Before you use the spirometer, breathe out normally. Place the mouthpiece in your mouth. Make sure your lips are closed tightly around it. Breathe in slowly and as deeply as you can through your mouth, causing the piston or the ball to rise toward the top of the chamber. Hold your breath for 3-5 seconds, or for as long  as possible. If the spirometer includes a coach indicator, use this to guide you in breathing. Slow down your breathing if the indicator goes above the marked areas. Remove the mouthpiece from your mouth and breathe out normally. The piston or ball will return to the bottom of the chamber. Rest for a few seconds, then repeat the steps 10 or more times. Take your time and take a few normal breaths between deep breaths so that you do not get dizzy or light-headed. Do this every 1-2 hours when you are awake. If the spirometer includes a goal marker to show the highest number you have reached (best effort), use this as a goal to work toward during each repetition. After each set of 10 deep breaths, cough a few times. This will help to make sure that your lungs are clear. If you have an incision on your chest or abdomen from surgery, place a pillow or a rolled-up towel firmly against the incision when you cough. This can help to reduce pain while taking deep breaths and coughing. General tips When you are able to get out of bed: Walk around often. Continue to take deep breaths and cough in order to clear your lungs. Keep using the incentive spirometer until your health care provider says it is okay to stop using it. If you have been in the hospital, you may be told to keep using the spirometer at home. Contact a health care provider  if: You are having difficulty using the spirometer. You have trouble using the spirometer as often as instructed. Your pain medicine is not giving enough relief for you to use the spirometer as told. You have a fever. Get help right away if: You develop shortness of breath. You develop a cough with bloody mucus from the lungs. You have fluid or blood coming from an incision site after you cough. Summary An incentive spirometer is a tool that can help you learn to take long, deep breaths to keep your lungs clear and active. You may be asked to use a spirometer after a  surgery, if you have a lung problem or a history of smoking, or if you have been inactive for a long period of time. Use your incentive spirometer as instructed every 1-2 hours while you are awake. If you have an incision on your chest or abdomen, place a pillow or a rolled-up towel firmly against your incision when you cough. This will help to reduce pain. Get help right away if you have shortness of breath, you cough up bloody mucus, or blood comes from your incision when you cough. This information is not intended to replace advice given to you by your health care provider. Make sure you discuss any questions you have with your health care provider. Document Revised: 01/09/2023 Document Reviewed: 01/09/2023 Elsevier Patient Education  2024 [employer].

## 2024-01-11 ENCOUNTER — Other Ambulatory Visit: Payer: Self-pay

## 2024-01-11 ENCOUNTER — Encounter (HOSPITAL_COMMUNITY)
Admission: RE | Admit: 2024-01-11 | Discharge: 2024-01-11 | Disposition: A | Discharge status: Home / Self Care | Source: Ambulatory Visit | Attending: Orthopedic Surgery | Admitting: Orthopedic Surgery

## 2024-01-11 ENCOUNTER — Encounter (HOSPITAL_COMMUNITY): Payer: Self-pay

## 2024-01-11 VITALS — BP 139/83 | HR 57 | Resp 18 | Ht 65.0 in | Wt 164.0 lb

## 2024-01-11 DIAGNOSIS — R030 Elevated blood-pressure reading, without diagnosis of hypertension: Secondary | ICD-10-CM | POA: Insufficient documentation

## 2024-01-11 DIAGNOSIS — R739 Hyperglycemia, unspecified: Secondary | ICD-10-CM | POA: Insufficient documentation

## 2024-01-11 DIAGNOSIS — Z01818 Encounter for other preprocedural examination: Secondary | ICD-10-CM | POA: Diagnosis not present

## 2024-01-11 DIAGNOSIS — Z8673 Personal history of transient ischemic attack (TIA), and cerebral infarction without residual deficits: Secondary | ICD-10-CM | POA: Insufficient documentation

## 2024-01-11 DIAGNOSIS — F1011 Alcohol abuse, in remission: Secondary | ICD-10-CM | POA: Diagnosis not present

## 2024-01-11 HISTORY — DX: Unspecified osteoarthritis, unspecified site: M19.90

## 2024-01-11 HISTORY — DX: Unspecified glaucoma: H40.9

## 2024-01-11 LAB — COMPREHENSIVE METABOLIC PANEL WITH GFR
ALT: 40 U/L (ref 0–44)
AST: 25 U/L (ref 15–41)
Albumin: 4.5 g/dL (ref 3.5–5.0)
Alkaline Phosphatase: 82 U/L (ref 38–126)
Anion gap: 10 (ref 5–15)
BUN: 12 mg/dL (ref 8–23)
CO2: 27 mmol/L (ref 22–32)
Calcium: 9.5 mg/dL (ref 8.9–10.3)
Chloride: 102 mmol/L (ref 98–111)
Creatinine, Ser: 0.84 mg/dL (ref 0.61–1.24)
GFR, Estimated: >60 mL/min (ref >60)
Glucose, Bld: 112 mg/dL — ABNORMAL HIGH (ref 70–99)
Potassium: 4.4 mmol/L (ref 3.5–5.1)
Sodium: 139 mmol/L (ref 135–145)
Total Bilirubin: 0.4 mg/dL (ref 0.0–1.2)
Total Protein: 7.4 g/dL (ref 6.5–8.1)

## 2024-01-11 LAB — SURGICAL PCR SCREEN
MRSA, PCR: NEGATIVE
Staphylococcus aureus: NEGATIVE

## 2024-01-11 LAB — CBC WITH DIFFERENTIAL/PLATELET
Abs Immature Granulocytes: 0.01 K/uL (ref 0.00–0.07)
Basophils Absolute: 0 K/uL (ref 0.0–0.1)
Basophils Relative: 1 %
Eosinophils Absolute: 0.2 K/uL (ref 0.0–0.5)
Eosinophils Relative: 2 %
HCT: 44.6 % (ref 39.0–52.0)
Hemoglobin: 14.9 g/dL (ref 13.0–17.0)
Immature Granulocytes: 0 %
Lymphocytes Relative: 26 %
Lymphs Abs: 1.6 K/uL (ref 0.7–4.0)
MCH: 30 pg (ref 26.0–34.0)
MCHC: 33.4 g/dL (ref 30.0–36.0)
MCV: 89.9 fL (ref 80.0–100.0)
Monocytes Absolute: 0.7 K/uL (ref 0.1–1.0)
Monocytes Relative: 11 %
Neutro Abs: 3.7 K/uL (ref 1.7–7.7)
Neutrophils Relative %: 60 %
Platelets: 200 K/uL (ref 150–400)
RBC: 4.96 MIL/uL (ref 4.22–5.81)
RDW: 12.9 % (ref 11.5–15.5)
WBC: 6.2 K/uL (ref 4.0–10.5)
nRBC: 0 % (ref 0.0–0.2)

## 2024-01-14 ENCOUNTER — Ambulatory Visit (HOSPITAL_COMMUNITY): Payer: Self-pay | Admitting: Certified Registered"

## 2024-01-14 ENCOUNTER — Observation Stay (HOSPITAL_COMMUNITY)
Admission: RE | Admit: 2024-01-14 | Discharge: 2024-01-15 | Disposition: A | Discharge status: Home / Self Care | Attending: Orthopedic Surgery | Admitting: Orthopedic Surgery

## 2024-01-14 ENCOUNTER — Other Ambulatory Visit: Payer: Self-pay

## 2024-01-14 ENCOUNTER — Ambulatory Visit (HOSPITAL_COMMUNITY)

## 2024-01-14 ENCOUNTER — Encounter (HOSPITAL_COMMUNITY): Payer: Self-pay | Admitting: Orthopedic Surgery

## 2024-01-14 ENCOUNTER — Encounter (HOSPITAL_COMMUNITY)
Admission: RE | Disposition: A | Discharge status: Home / Self Care | Payer: Self-pay | Source: Home / Self Care | Attending: Orthopedic Surgery

## 2024-01-14 DIAGNOSIS — M12811 Other specific arthropathies, not elsewhere classified, right shoulder: Secondary | ICD-10-CM | POA: Diagnosis not present

## 2024-01-14 DIAGNOSIS — Z87891 Personal history of nicotine dependence: Secondary | ICD-10-CM | POA: Insufficient documentation

## 2024-01-14 DIAGNOSIS — M75121 Complete rotator cuff tear or rupture of right shoulder, not specified as traumatic: Secondary | ICD-10-CM | POA: Diagnosis not present

## 2024-01-14 DIAGNOSIS — Z8673 Personal history of transient ischemic attack (TIA), and cerebral infarction without residual deficits: Secondary | ICD-10-CM | POA: Insufficient documentation

## 2024-01-14 DIAGNOSIS — Z7982 Long term (current) use of aspirin: Secondary | ICD-10-CM | POA: Diagnosis not present

## 2024-01-14 DIAGNOSIS — Z79899 Other long term (current) drug therapy: Secondary | ICD-10-CM | POA: Diagnosis not present

## 2024-01-14 DIAGNOSIS — Z96611 Presence of right artificial shoulder joint: Secondary | ICD-10-CM | POA: Diagnosis not present

## 2024-01-14 DIAGNOSIS — G8918 Other acute postprocedural pain: Secondary | ICD-10-CM | POA: Diagnosis not present

## 2024-01-14 DIAGNOSIS — Z471 Aftercare following joint replacement surgery: Secondary | ICD-10-CM | POA: Diagnosis not present

## 2024-01-14 DIAGNOSIS — M19011 Primary osteoarthritis, right shoulder: Secondary | ICD-10-CM | POA: Diagnosis not present

## 2024-01-14 HISTORY — PX: REVERSE SHOULDER ARTHROPLASTY: SHX5054

## 2024-01-14 LAB — HEMOGLOBIN AND HEMATOCRIT, BLOOD
HCT: 40.4 % (ref 39.0–52.0)
Hemoglobin: 13.7 g/dL (ref 13.0–17.0)

## 2024-01-14 SURGERY — ARTHROPLASTY, SHOULDER, TOTAL, REVERSE
Anesthesia: Regional | Site: Shoulder | Laterality: Right

## 2024-01-14 MED ORDER — MIDAZOLAM HCL 2 MG/2ML IJ SOLN
INTRAMUSCULAR | Status: AC
  Filled 2024-01-14: qty 2

## 2024-01-14 MED ORDER — ORAL CARE MOUTH RINSE: 15.0000 mL | Freq: Once | OROMUCOSAL | Status: AC

## 2024-01-14 MED ORDER — FENTANYL CITRATE (PF) 100 MCG/2ML IJ SOLN
INTRAMUSCULAR | Status: DC | PRN
  Administered 2024-01-14 (×2): 50 ug via INTRAVENOUS

## 2024-01-14 MED ORDER — MORPHINE SULFATE (PF) 2 MG/ML IV SOLN: 1.0000 mg | INTRAVENOUS | Status: DC | PRN

## 2024-01-14 MED ORDER — ONDANSETRON HCL 4 MG PO TABS: 4.0000 mg | ORAL_TABLET | Freq: Four times a day (QID) | ORAL | Status: DC | PRN

## 2024-01-14 MED ORDER — ROCURONIUM BROMIDE 10 MG/ML (PF) SYRINGE
PREFILLED_SYRINGE | INTRAVENOUS | Status: DC | PRN
  Administered 2024-01-14: 20 mg via INTRAVENOUS
  Administered 2024-01-14: 100 mg via INTRAVENOUS
  Administered 2024-01-14: 10 mg via INTRAVENOUS

## 2024-01-14 MED ORDER — CEFAZOLIN SODIUM-DEXTROSE 2-4 GM/100ML-% IV SOLN
2.0000 g | INTRAVENOUS | Status: AC
  Administered 2024-01-14: 2 g via INTRAVENOUS
  Filled 2024-01-14: qty 100

## 2024-01-14 MED ORDER — FENTANYL CITRATE (PF) 100 MCG/2ML IJ SOLN
INTRAMUSCULAR | Status: AC
  Filled 2024-01-14: qty 2

## 2024-01-14 MED ORDER — ONDANSETRON HCL 4 MG/2ML IJ SOLN
INTRAMUSCULAR | Status: AC
Start: 2024-01-14 — End: 2024-01-14
  Filled 2024-01-14: qty 2

## 2024-01-14 MED ORDER — LIDOCAINE 2% (20 MG/ML) 5 ML SYRINGE
INTRAMUSCULAR | Status: AC
  Filled 2024-01-14: qty 5

## 2024-01-14 MED ORDER — DIPHENHYDRAMINE HCL 12.5 MG/5ML PO ELIX: 12.5000 mg | ORAL_SOLUTION | ORAL | Status: DC | PRN

## 2024-01-14 MED ORDER — ACETAMINOPHEN 500 MG PO TABS
1000.0000 mg | ORAL_TABLET | Freq: Three times a day (TID) | ORAL | Status: DC
  Administered 2024-01-14 – 2024-01-15 (×4): 1000 mg via ORAL
  Filled 2024-01-14 (×3): qty 2

## 2024-01-14 MED ORDER — PHENYLEPHRINE HCL-NACL 20-0.9 MG/250ML-% IV SOLN
INTRAVENOUS | Status: DC | PRN
Start: 2024-01-14 — End: 2024-01-14
  Administered 2024-01-14: 40 ug/min via INTRAVENOUS

## 2024-01-14 MED ORDER — OXYCODONE HCL 5 MG PO TABS
ORAL_TABLET | ORAL | Status: AC
  Filled 2024-01-14: qty 1

## 2024-01-14 MED ORDER — CEFAZOLIN SODIUM-DEXTROSE 2-4 GM/100ML-% IV SOLN: 2.0000 g | Freq: Three times a day (TID) | INTRAVENOUS | Status: DC

## 2024-01-14 MED ORDER — DEXMEDETOMIDINE HCL IN NACL 80 MCG/20ML IV SOLN
INTRAVENOUS | Status: AC
  Filled 2024-01-14: qty 20

## 2024-01-14 MED ORDER — ROPIVACAINE HCL 5 MG/ML IJ SOLN
INTRAMUSCULAR | Status: DC | PRN
Start: 2024-01-14 — End: 2024-01-14
  Administered 2024-01-14 (×2): 15 mL via PERINEURAL

## 2024-01-14 MED ORDER — ROCURONIUM BROMIDE 10 MG/ML (PF) SYRINGE
PREFILLED_SYRINGE | INTRAVENOUS | Status: AC
  Filled 2024-01-14: qty 10

## 2024-01-14 MED ORDER — BUPIVACAINE-EPINEPHRINE (PF) 0.5% -1:200000 IJ SOLN
INTRAMUSCULAR | Status: AC
  Filled 2024-01-14: qty 30

## 2024-01-14 MED ORDER — CHLORHEXIDINE GLUCONATE 0.12 % MT SOLN
15.0000 mL | Freq: Once | OROMUCOSAL | Status: AC
  Administered 2024-01-14: 15 mL via OROMUCOSAL

## 2024-01-14 MED ORDER — DEXMEDETOMIDINE HCL IN NACL 80 MCG/20ML IV SOLN
INTRAVENOUS | Status: DC | PRN
  Administered 2024-01-14: 20 ug via INTRAVENOUS

## 2024-01-14 MED ORDER — TRANEXAMIC ACID-NACL 1000-0.7 MG/100ML-% IV SOLN
1000.0000 mg | INTRAVENOUS | Status: AC
  Administered 2024-01-14: 1000 mg via INTRAVENOUS
  Filled 2024-01-14: qty 100

## 2024-01-14 MED ORDER — PROPOFOL 10 MG/ML IV BOLUS
INTRAVENOUS | Status: AC
  Filled 2024-01-14: qty 20

## 2024-01-14 MED ORDER — MIDAZOLAM HCL (PF) 2 MG/2ML IJ SOLN
INTRAMUSCULAR | Status: DC | PRN
  Administered 2024-01-14: 2 mg via INTRAVENOUS

## 2024-01-14 MED ORDER — DEXAMETHASONE SOD PHOSPHATE PF 10 MG/ML IJ SOLN
INTRAMUSCULAR | Status: DC | PRN
  Administered 2024-01-14: 10 mg via INTRAVENOUS

## 2024-01-14 MED ORDER — LIDOCAINE HCL (CARDIAC) PF 100 MG/5ML IV SOSY
PREFILLED_SYRINGE | INTRAVENOUS | Status: DC | PRN
  Administered 2024-01-14: 1 mL via INTRATRACHEAL
  Administered 2024-01-14: 30 mg via INTRATRACHEAL

## 2024-01-14 MED ORDER — SUGAMMADEX SODIUM 200 MG/2ML IV SOLN
INTRAVENOUS | Status: DC | PRN
  Administered 2024-01-14: 200 mg via INTRAVENOUS

## 2024-01-14 MED ORDER — CELECOXIB 100 MG PO CAPS
100.0000 mg | ORAL_CAPSULE | Freq: Every day | ORAL | Status: DC
  Administered 2024-01-14 – 2024-01-15 (×2): 100 mg via ORAL
  Filled 2024-01-14: qty 1

## 2024-01-14 MED ORDER — ACETAMINOPHEN 500 MG PO TABS
ORAL_TABLET | ORAL | Status: AC
  Filled 2024-01-14: qty 2

## 2024-01-14 MED ORDER — ONDANSETRON HCL 4 MG/2ML IJ SOLN
INTRAMUSCULAR | Status: DC | PRN
  Administered 2024-01-14: 4 mg via INTRAVENOUS

## 2024-01-14 MED ORDER — CELECOXIB 400 MG PO CAPS
ORAL_CAPSULE | ORAL | Status: AC
  Filled 2024-01-14: qty 1

## 2024-01-14 MED ORDER — CEFAZOLIN SODIUM-DEXTROSE 2-4 GM/100ML-% IV SOLN
2.0000 g | Freq: Three times a day (TID) | INTRAVENOUS | Status: AC
Start: 2024-01-14 — End: 2024-01-15
  Administered 2024-01-14 – 2024-01-15 (×3): 2 g via INTRAVENOUS
  Filled 2024-01-14 (×3): qty 100

## 2024-01-14 MED ORDER — VANCOMYCIN HCL 1000 MG IV SOLR
INTRAVENOUS | Status: AC
  Filled 2024-01-14: qty 20

## 2024-01-14 MED ORDER — TIMOLOL MALEATE 0.5 % OP SOLN
1.0000 [drp] | Freq: Every morning | OPHTHALMIC | Status: DC
  Administered 2024-01-14: 1 [drp] via OPHTHALMIC
  Filled 2024-01-14: qty 5

## 2024-01-14 MED ORDER — ONDANSETRON HCL 4 MG/2ML IJ SOLN: 4.0000 mg | Freq: Four times a day (QID) | INTRAMUSCULAR | Status: DC | PRN

## 2024-01-14 MED ORDER — VANCOMYCIN HCL 1000 MG IV SOLR
INTRAVENOUS | Status: DC | PRN
  Administered 2024-01-14: 1000 mg

## 2024-01-14 MED ORDER — SODIUM CHLORIDE 0.9 % IR SOLN
Status: DC | PRN
  Administered 2024-01-14: 1000 mL
  Administered 2024-01-14: 3000 mL

## 2024-01-14 MED ORDER — OXYCODONE HCL 5 MG PO TABS: 10.0000 mg | ORAL_TABLET | ORAL | Status: DC | PRN

## 2024-01-14 MED ORDER — LACTATED RINGERS IV SOLN: INTRAVENOUS | Status: DC

## 2024-01-14 MED ORDER — OXYCODONE HCL 5 MG PO TABS
5.0000 mg | ORAL_TABLET | ORAL | Status: DC | PRN
  Administered 2024-01-14: 5 mg via ORAL

## 2024-01-14 MED ORDER — ASPIRIN 81 MG PO CHEW
81.0000 mg | CHEWABLE_TABLET | Freq: Two times a day (BID) | ORAL | Status: DC
  Administered 2024-01-15: 81 mg via ORAL
  Filled 2024-01-14: qty 1

## 2024-01-14 MED ORDER — STERILE WATER FOR IRRIGATION IR SOLN
Status: DC | PRN
  Administered 2024-01-14: 1000 mL

## 2024-01-14 MED ORDER — ROPIVACAINE HCL 5 MG/ML IJ SOLN
INTRAMUSCULAR | Status: AC
  Filled 2024-01-14: qty 30

## 2024-01-14 MED ORDER — PROPOFOL 10 MG/ML IV BOLUS
INTRAVENOUS | Status: DC | PRN
  Administered 2024-01-14: 200 mg via INTRAVENOUS

## 2024-01-14 MED ORDER — ROSUVASTATIN CALCIUM 20 MG PO TABS
40.0000 mg | ORAL_TABLET | Freq: Every day | ORAL | Status: DC
  Administered 2024-01-14 – 2024-01-15 (×2): 40 mg via ORAL
  Filled 2024-01-14 (×2): qty 2

## 2024-01-14 SURGICAL SUPPLY — 64 items
ANCHOR SUT MAYO CAT SZ1 1/2 CI (Anchor) IMPLANT
BASEPLATE AUG FULL 24 10D (Plate) IMPLANT
BIT DRILL FLUTED 3.0 STRL (BIT) IMPLANT
BLADE SAW SGTL 83.5X18.5 (BLADE) ×2 IMPLANT
BNDG GAUZE DERMACEA FLUFF 4 (GAUZE/BANDAGES/DRESSINGS) ×2 IMPLANT
CALIBRATOR GLENOID VIP 5-D (SYSTAGENIX WOUND MANAGEMENT) IMPLANT
CEMENT HV SMART SET (Cement) IMPLANT
CHLORAPREP W/TINT 26 (MISCELLANEOUS) ×4 IMPLANT
CLOTH BEACON ORANGE TIMEOUT ST (SAFETY) ×2 IMPLANT
COOLER ICEMAN CLASSIC (MISCELLANEOUS) ×2 IMPLANT
COUNTER NDL 20CT MAGNET RED (NEEDLE) ×2 IMPLANT
COVER LIGHT HANDLE STERIS (MISCELLANEOUS) ×4 IMPLANT
CUP SUT UNIV REVERS 36+2 RT (Cup) IMPLANT
DRAPE INCISE IOBAN 44X35 STRL (DRAPES) ×2 IMPLANT
DRAPE SHOULDER BEACH CHAIR (DRAPES) ×2 IMPLANT
DRAPE U-SHAPE 47X51 STRL (DRAPES) ×2 IMPLANT
DRAPE UTILITY W/TAPE 26X15 (DRAPES) IMPLANT
DRSG AQUACEL AG ADV 3.5X10 (GAUZE/BANDAGES/DRESSINGS) ×2 IMPLANT
ELECT BLADE 6 FLAT ULTRCLN (ELECTRODE) ×2 IMPLANT
ELECTRODE REM PT RTRN 9FT ADLT (ELECTROSURGICAL) ×2 IMPLANT
GLENOSPHERE 36 +4 LAT/24 (Joint) IMPLANT
GLOVE BIO SURGEON STRL SZ8 (GLOVE) ×6 IMPLANT
GLOVE BIO SURGEON STRL SZ8.5 (GLOVE) IMPLANT
GLOVE BIOGEL PI IND STRL 7.0 (GLOVE) ×4 IMPLANT
GLOVE BIOGEL PI IND STRL 8 (GLOVE) ×2 IMPLANT
GOWN STRL REUS W/TWL LRG LVL3 (GOWN DISPOSABLE) ×4 IMPLANT
GOWN STRL REUS W/TWL XL LVL3 (GOWN DISPOSABLE) ×2 IMPLANT
HOOD PEEL AWAY T7 (MISCELLANEOUS) ×6 IMPLANT
KIT POSITION SHOULDER SCHLEI (MISCELLANEOUS) ×2 IMPLANT
KIT STABILIZATION SHOULDER (MISCELLANEOUS) ×2 IMPLANT
KIT TURNOVER KIT A (KITS) ×2 IMPLANT
LINER HUMERAL 36 +3MM SM (Shoulder) IMPLANT
MANIFOLD NEPTUNE II (INSTRUMENTS) ×2 IMPLANT
MARKER SKIN DUAL TIP RULER LAB (MISCELLANEOUS) ×2 IMPLANT
NDL TAPERED W/ NITINOL LOOP (MISCELLANEOUS) IMPLANT
NEEDLE TAPERED W/ NITINOL LOOP (MISCELLANEOUS) ×1 IMPLANT
PACK SRG BSC III STRL LF ECLPS (CUSTOM PROCEDURE TRAY) ×2 IMPLANT
PACK TOTAL JOINT (CUSTOM PROCEDURE TRAY) ×2 IMPLANT
PAD COLD SHLDR SM WRAP-ON (PAD) ×2 IMPLANT
PENCIL SMOKE EVACUATOR (MISCELLANEOUS) ×2 IMPLANT
PIN NITINOL TARGETER 2.8 (PIN) IMPLANT
POST MODULAR MGS BASEPLATE 25 (Post) IMPLANT
REAMER VIP AUGMENTED MGS LOCK (SUTURE) IMPLANT
SCREW PERI LOCK 5.5X32 (Screw) IMPLANT
SCREW PERI NL (Screw) IMPLANT
SCREW PERIPHERAL 5.5X20 LOCK (Screw) IMPLANT
SET BASIN LINEN APH (SET/KITS/TRAYS/PACK) ×2 IMPLANT
SET HNDPC FAN SPRY TIP SCT (DISPOSABLE) IMPLANT
SLING ULTRA II L (ORTHOPEDIC SUPPLIES) IMPLANT
SOL .9 NS 3000ML IRR UROMATIC (IV SOLUTION) ×2 IMPLANT
SOLN 0.9% NACL POUR BTL 1000ML (IV SOLUTION) ×2 IMPLANT
SOLN STERILE WATER BTL 1000 ML (IV SOLUTION) ×2 IMPLANT
STEM HUM UNIV REV 9 (Stem) IMPLANT
STRIP CLOSURE SKIN 1/2X4 (GAUZE/BANDAGES/DRESSINGS) ×4 IMPLANT
SUT MNCRL AB 4-0 PS2 18 (SUTURE) ×2 IMPLANT
SUT MON AB 2-0 CT1 36 (SUTURE) ×2 IMPLANT
SUT VIC AB 1 CT1 27XBRD ANTBC (SUTURE) ×2 IMPLANT
SUTURE TAPE 1.3 40 TPR END (SUTURE) IMPLANT
SUTURETAPE 1.3 40 W/NDL BLK/WH (SUTURE) ×4 IMPLANT
SYR BULB IRRIG 60ML STRL (SYRINGE) ×2 IMPLANT
TOWEL OR 17X26 4PK STRL BLUE (TOWEL DISPOSABLE) ×2 IMPLANT
TRAY FOLEY W/BAG SLVR 16FR ST (SET/KITS/TRAYS/PACK) ×2 IMPLANT
TUBE CONNECTING 12X1/4 (SUCTIONS) ×2 IMPLANT
YANKAUER SUCT 12FT TUBE ARGYLE (SUCTIONS) ×2 IMPLANT

## 2024-01-14 NOTE — Transfer of Care (Signed)
 Immediate Anesthesia Transfer of Care Note  Patient: Ronnie Walker  Procedure(s) Performed: ARTHROPLASTY, SHOULDER, TOTAL, REVERSE (Right: Shoulder)  Patient Location: PACU  Anesthesia Type:General and Regional  Level of Consciousness: drowsy, patient cooperative, and responds to stimulation  Airway & Oxygen Therapy: Patient Spontanous Breathing and Patient connected to face mask oxygen  Post-op Assessment: Report given to RN and Post -op Vital signs reviewed and stable  Post vital signs: Reviewed and stable  Last Vitals:  Vitals Value Taken Time  BP 124/76   Temp 98.9   Pulse 67 01/14/24 10:41  Resp 18 01/14/24 10:41  SpO2 100 % 01/14/24 10:41  Vitals shown include unfiled device data.  Last Pain:  Vitals:   01/14/24 0649  TempSrc: Oral  PainSc: 0-No pain      Patients Stated Pain Goal: 5 (01/14/24 9350)  Complications: No notable events documented.

## 2024-01-14 NOTE — Plan of Care (Signed)

## 2024-01-14 NOTE — Progress Notes (Signed)
 Patient in room 3 of post op awaiting inpatient bed assignment. Wife at bedside, VS and neurovascular checks done, patient given a drink, does not want any food at this time. Wife given warm blankets

## 2024-01-14 NOTE — Interval H&P Note (Signed)
 History and Physical Interval Note:  01/14/2024 7:47 AM  Ronnie Walker  has presented today for surgery, with the diagnosis of Right rotator cuff tear.  The various methods of treatment have been discussed with the patient and family. After consideration of risks, benefits and other options for treatment, the patient has consented to  Procedure(s): ARTHROPLASTY, SHOULDER, TOTAL, REVERSE (Right) as a surgical intervention.  The patient's history has been reviewed, patient examined, no change in status, stable for surgery.  I have reviewed the patient's chart and labs.  Questions were answered to the patient's satisfaction.     Ronnie Walker

## 2024-01-14 NOTE — Progress Notes (Signed)
 Pt has ambulated in hallway >250 ft with standby assist, tolerated well, gait steady, no c/o pain, SOB or dizziness.Urinating without difficulty. Has tolerated po food and fluid with no n/v. IceMan pack intact to arm/shoulder. Pt with good movement and sensation of right arm/hand, warm with brisk cap refill. Still c/o tingling numbness in posterior thumb and forefinger but not as numb as on arrival to unit. Advised to call for needs, call bell in reach, states understanding.

## 2024-01-14 NOTE — Op Note (Signed)
 Orthopaedic Surgery Operative Note (CSN: [id number])  Ronnie Walker  [birth year] Date of Surgery: 01/14/2024   Diagnoses:  Right rotator cuff tear Right rotator cuff tear arthropathy   Procedure: Right reverse shoulder arthroplasty   Operative Finding Successful completion of the planned procedure.    Post-Op Diagnosis: Same Surgeons:Primary: Ronnie Walker LABOR, MD Assistants:  Ronnie Walker Location: [facility] OR ROOM 4 Anesthesia: General with local anesthesia Antibiotics: Ancef  2 g with local vancomycin powder 1 g at the surgical site Tourniquet time: N/A Estimated Blood Loss: 250 cc Complications: None Specimens: None  Implants: Implant Name Type Inv. Item Serial No. Manufacturer Lot No. LRB No. Used Action  POST MODULAR MGS BASEPLATE 25 - [id number] Post POST MODULAR MGS BASEPLATE 25  [employer] [id number] Right 1 Implanted  BASEPLATE AUG FULL 24 10D - [id number] Plate BASEPLATE AUG FULL 24 10D  [employer] [id number] Right 1 Implanted  SCREW PERI NL - [id number] Screw SCREW PERI NL  [employer] [id number] Right 1 Implanted  SCREW PERI LOCK 5.5X32 - [id number] Screw SCREW PERI LOCK 5.5X32  [employer] [id number] Right 1 Implanted  SCREW PERIPHERAL 5.5X20 LOCK - [id number] Screw SCREW PERIPHERAL 5.5X20 LOCK  [employer] [id number] Right 1 Implanted  GLENOSPHERE 36 +4 LAT/24 - [id number] Joint GLENOSPHERE 36 +4 LAT/24  [employer] [id number] Right 1 Implanted  LINER HUMERAL 36 +3MM SM - [id number] Shoulder LINER HUMERAL 36 +3MM SM  [employer] [id number] Right 1 Implanted  STEM HUM UNIV REV 9 - [id number] Stem STEM HUM UNIV REV 9  [employer] [id number] Right 1 Implanted  CUP SUT UNIV REVERS 36+2 RT - [id number] Cup CUP SUT UNIV REVERS 36+2 RT  [employer] [id number] Right 1 Implanted    Indications for Surgery:   Ronnie Walker is a 73 y.o. male with an irreparable right rotator cuff tear, consistent with rotator cuff arthropathy, who has failed nonoperative management.  He continues to have pain  and limited function.  As a result, I recommended a reverse shoulder arthroplasty.  Benefits and risks of operative and nonoperative management were discussed prior to surgery with the patient and informed consent form was completed.  Specific risks including, but not limited to, infection, need for additional surgery, bleeding, persistent pain, non-union, implant loosening, malunion, stiffness, dislocation and more severe complications associated with anesthesia were discussed with the patient.  The patient has elected to proceed.  All questions have been answered.  Surgical consent has been finalized.    Procedure:   The patient was identified properly. Informed consent was obtained and the surgical site was marked. The patient was taken up to operating room where general anesthesia was induced.  The patient was positioned in beach chair position.  The right shoulder was prepped and draped in the usual sterile fashion.  Timeout was performed before the beginning of the case.  The patient received appropriate antibiotics prior to making incision.  In addition, the patient received 1 g TXA prior to starting.  This was confirmed during the preincision timeout.  We made an incision for the standard deltopectoral approach was performed with a #10 blade. We dissected down through the subcutaneous tissues and the cephalic vein was taken laterally with the deltoid. The clavipectoral fascia was incised in line with the incision. Deep retractors were placed. The long of the biceps tendon was identified and there was significant tenosynovitis present.  A biceps tenodesis was performed to the pectoralis tendon with #2 Fiberwire. The remaining biceps was followed up into  the rotator interval where it was released.   The subscapularis was taken down in a full thickness layer with capsule along the humeral neck extending inferiorly around the humeral head. We continued releasing the capsule directly off of the  osteophytes inferiorly all the way around the corner. This allowed us  to dislocate the humeral head. Multiple tag stitches were placed in the subscapularis tendon to maintain control during the release of the tendon, and for the remainder of the case.   The rotator cuff was carefully examined and noted to be irreperably torn.  The decision was confirmed that a reverse total shoulder was indicated for this patient.  There were osteophytes along the inferior humeral neck. The osteophytes were removed using an osteotome and a rongeur.  At this point, the anatomic neck was well visualized.      A humeral osteotomy was performed with an oscillating saw. The head fragment was passed off to the back table, and used to estimate the humeral head size for our implant.  A cut protector was placed.  The humerus was retracted posteriorly and we turned our attention to glenoid exposure.  The subscapularis was again identified and we took care to palpate the axillary nerve anteriorly and verify its position with gentle palpation as well as the tug test.  We then released the SGHL with bovie cautery prior to placing a curved mayo at the junction of the anterior glenoid well above the axillary nerve and bluntly dissecting the subscapularis from the capsule.  We then carefully protected the axillary nerve as we gently released the inferior capsule to fully mobilize the subscapularis.  An anterior deltoid retractor was then placed as well as a small Hohmann retractor superiorly.   The glenoid demonstrated very little wear.  Cartilage was intact.  We used a curette and scraper to remove the remaining cartilage.  The remaining labrum was removed circumferentially taking great care not to disrupt the posterior capsule.   Based on preoperative templating, the drill guide was assembled on the back table.  The glenoid face was then reamed eccentrically over the guide wire. The center hole was drilled over the guidepin in a near  anatomic angle of version. Next the glenoid vault was drilled back to a depth of 25 mm.  We then placed a 36 mm size baseplate with 4 mm lateralization was selected with a 25 mm length central post.  The base plate was press-fit into the glenoid vault obtaining secure fixation. We next placed an inferior nonlocking screw, followed by superior and anterior locking screws for additional fixation.  Next a 36 mm glenosphere was selected and impacted onto the baseplate. The center screw was tightened.       We turned attention back to the humeral side. The cut protector was removed. A humeral cutting guide with a version rod was secured to the anterior aspect of the humeral head. The version was set at 20 of retroversion.  A starter awl was used to open the humeral canal. We next used T-handle straight reamers to ream up to an appropriate fit. We then broached starting with a size 5 broach and broaching up to a 9 which obtained an appropriate fit. The broach handle was removed.  We trialed with multiple size tray and polyethylene options and selected a 3 mm which provided good stability and range of motion without excess soft tissue tension. Posterior offset tray was used. The shoulder was trialed.  There was good ROM in all planes  and the shoulder was stable with no inferior translation.     The real humeral implants were opened after again confirming sizes.  The trial was removed. The humeral component was press-fit obtaining a secure fit. A 36 mm posterior offset tray was selected and impacted onto the stem.  A 36+3 polyethylene liner was impacted onto the stem.  The joint was reduced and thoroughly irrigated with pulsatile lavage. Subscap was repaired back with #2 Fiberwire sutures through eyelets on the stem. Hemostasis was obtained. The deltopectoral interval was reapproximated with #1 Vicryl. The subcutaneous tissues were closed with 2-0 monocryl and the skin was closed with running monocryl.    The  wounds were cleaned and dried and an Aquacel dressing was placed. The drapes taken down. The arm was placed into sling with abduction pillow. Patient was awakened, extubated, and transferred to the recovery room in stable condition. There were no intraoperative complications. The sponge, needle, and attention counts were  correct at the end of the case.   Post-operative plan:  The patient will be admitted for over night observation.   We have placed a referral for PT to begin 1-2 weeks postop, prior to the first postoperative clinic visit.  DVT prophylaxis Aspirin  81 mg twice daily for 6 weeks.    Pain control with PRN pain medication preferring oral medicines.   Follow up plan will be scheduled in approximately 10-14 days for incision check and XR.

## 2024-01-14 NOTE — Anesthesia Procedure Notes (Signed)
 Anesthesia Regional Block: Interscalene brachial plexus block   Pre-Anesthetic Checklist: , timeout performed,  Correct Patient, Correct Site, Correct Laterality,  Correct Procedure, Correct Position, site marked,  Risks and benefits discussed,  Surgical consent,  Pre-op evaluation,  At surgeon's request and post-op pain management  Laterality: Right  Prep: chloraprep       Needles:  Injection technique: Single-shot  Needle Type: Stimiplex     Needle Length: 4cm  Needle Gauge: 22     Additional Needles:   Procedures:, nerve stimulator,,, ultrasound used (permanent image in chart),,    Narrative:  Start time: 01/14/2024 7:22 AM End time: 01/14/2024 7:33 AM Injection made incrementally with aspirations every 5 mL. Anesthesiologist: Herschell Hollering, MD CRNA: Pheobe Adine CROME, CRNA

## 2024-01-14 NOTE — Anesthesia Procedure Notes (Signed)
 Procedure Name: Intubation Date/Time: 01/14/2024 7:58 AM  Performed by: Pheobe Adine CROME, CRNAPre-anesthesia Checklist: Patient identified, Emergency Drugs available, Suction available, Timeout performed and Patient being monitored Patient Re-evaluated:Patient Re-evaluated prior to induction Oxygen Delivery Method: Circle system utilized Preoxygenation: Pre-oxygenation with 100% oxygen Induction Type: IV induction Ventilation: Mask ventilation without difficulty Laryngoscope Size: Mac and 3 Grade View: Grade I Tube type: Oral Number of attempts: 1 Airway Equipment and Method: Video-laryngoscopy Placement Confirmation: ETT inserted through vocal cords under direct vision, positive ETCO2, CO2 detector and breath sounds checked- equal and bilateral Secured at: 23 cm Tube secured with: Tape Dental Injury: Teeth and Oropharynx as per pre-operative assessment

## 2024-01-14 NOTE — Anesthesia Postprocedure Evaluation (Signed)
 Anesthesia Post Note  Patient: Ronnie Walker  Procedure(s) Performed: ARTHROPLASTY, SHOULDER, TOTAL, REVERSE (Right: Shoulder)  Patient location during evaluation: PACU Anesthesia Type: Regional Level of consciousness: awake and alert Pain management: pain level controlled Vital Signs Assessment: post-procedure vital signs reviewed and stable Respiratory status: spontaneous breathing, nonlabored ventilation, respiratory function stable and patient connected to nasal cannula oxygen Cardiovascular status: blood pressure returned to baseline and stable Postop Assessment: no apparent nausea or vomiting Anesthetic complications: no   No notable events documented.   Last Vitals:  Vitals:   01/14/24 0745 01/14/24 1042  BP: 115/88   Pulse: 70 63  Resp: 16 14  Temp:  36.6 C  SpO2: 99% 95%    Last Pain:  Vitals:   01/14/24 0649  TempSrc: Oral  PainSc: 0-No pain                 Andrea Limes

## 2024-01-14 NOTE — Anesthesia Preprocedure Evaluation (Addendum)
 Anesthesia Evaluation  Patient identified by MRN, date of birth, ID band Patient awake    Reviewed: Allergy & Precautions, H&P , NPO status , Patient's Chart, lab work & pertinent test results  Airway Mallampati: II  TM Distance: >3 FB Neck ROM: Full    Dental no notable dental hx.    Pulmonary former smoker   Pulmonary exam normal breath sounds clear to auscultation       Cardiovascular negative cardio ROS Normal cardiovascular exam Rhythm:Regular Rate:Normal     Neuro/Psych CVA  negative psych ROS   GI/Hepatic ,GERD  ,,(+)     substance abuse  alcohol usePt had 5 beers yesterday finishing last one at 1700 States he drinks 12-16 units/week  on average   Endo/Other  negative endocrine ROS    Renal/GU negative Renal ROS  negative genitourinary   Musculoskeletal  (+) Arthritis ,    Abdominal   Peds negative pediatric ROS (+)  Hematology negative hematology ROS (+)   Anesthesia Other Findings   Reproductive/Obstetrics negative OB ROS                              Anesthesia Physical Anesthesia Plan  ASA: 2  Anesthesia Plan: General and Regional   Post-op Pain Management: Regional block*   Induction: Intravenous  PONV Risk Score and Plan:   Airway Management Planned: Oral ETT  Additional Equipment:   Intra-op Plan:   Post-operative Plan: Extubation in OR  Informed Consent: I have reviewed the patients History and Physical, chart, labs and discussed the procedure including the risks, benefits and alternatives for the proposed anesthesia with the patient or authorized representative who has indicated his/her understanding and acceptance.     Dental advisory given  Plan Discussed with: CRNA  Anesthesia Plan Comments:          Anesthesia Quick Evaluation

## 2024-01-14 NOTE — OR Nursing (Signed)
 Dr. Onesimo in to talk with patient at 100.

## 2024-01-15 DIAGNOSIS — Z87891 Personal history of nicotine dependence: Secondary | ICD-10-CM | POA: Diagnosis not present

## 2024-01-15 DIAGNOSIS — M75121 Complete rotator cuff tear or rupture of right shoulder, not specified as traumatic: Secondary | ICD-10-CM | POA: Diagnosis not present

## 2024-01-15 DIAGNOSIS — Z7982 Long term (current) use of aspirin: Secondary | ICD-10-CM | POA: Diagnosis not present

## 2024-01-15 DIAGNOSIS — Z79899 Other long term (current) drug therapy: Secondary | ICD-10-CM | POA: Diagnosis not present

## 2024-01-15 DIAGNOSIS — Z8673 Personal history of transient ischemic attack (TIA), and cerebral infarction without residual deficits: Secondary | ICD-10-CM | POA: Diagnosis not present

## 2024-01-15 MED ORDER — CELECOXIB 100 MG PO CAPS: 100.0000 mg | ORAL_CAPSULE | Freq: Every day | ORAL | 0 refills | Status: AC

## 2024-01-15 MED ORDER — OXYCODONE HCL 5 MG PO TABS: 5.0000 mg | ORAL_TABLET | ORAL | 0 refills | Status: AC | PRN

## 2024-01-15 MED ORDER — ACETAMINOPHEN 500 MG PO TABS: 1000.0000 mg | ORAL_TABLET | Freq: Three times a day (TID) | ORAL | 0 refills | Status: AC

## 2024-01-15 MED ORDER — ASPIRIN 81 MG PO TBEC: 81.0000 mg | DELAYED_RELEASE_TABLET | Freq: Two times a day (BID) | ORAL | 0 refills | Status: AC

## 2024-01-15 NOTE — Discharge Instructions (Signed)
 Ronnie Dunlap [name], MD MS [facility] [address] [place],  [place]  [address] Phone: [phone] Fax: [phone]    POST-OPERATIVE INSTRUCTIONS - TOTAL SHOULDER REPLACEMENT    WOUND CARE You may leave the operative dressing in place until your follow-up appointment. KEEP THE INCISIONS CLEAN AND DRY. There may be a small amount of fluid/bleeding leaking at the surgical site. This is normal after surgery.  If it fills with liquid or blood please call us  immediately to change it for you. Use the provided ice machine or Ice packs as often as possible for the first 3-4 days, then as needed for pain relief.  Keep a layer of cloth or a shirt between your skin and the cooling unit to prevent frost bite as it can get very cold.  SHOWERING: - You may shower on Post-Op Day #3.  - The dressing is water resistant but do not scrub it as it may start to peel up.   - You may remove the sling for showering, but keep a water resistant pillow under the arm to keep both the  elbow and shoulder away from the body (mimicking the abduction sling).  - Gently pat the area dry.  - Do not soak the shoulder in water. Do not go swimming in the pool or ocean until your sutures are removed. - KEEP THE INCISIONS CLEAN AND DRY.  EXERCISES Wear the sling at all times except when doing your exercises. You may remove the sling for showering, but keep the arm across the chest or in a secondary sling.    Accidental/Purposeful External Rotation and shoulder flexion (reaching behind you) is to be avoided at all costs for the first month. It is ok to come out of your sling if your are sitting and have assistance for eating.  Do not lift anything heavier than 1 pound until we discuss it further in clinic. Please perform the exercises:   Elbow / Hand / Wrist  Range of Motion Exercises Grip strengthening   REGIONAL ANESTHESIA (NERVE BLOCKS) The anesthesia team may have performed a nerve block  for you if safe in the setting of your care.  This is a great tool used to minimize pain.  Typically the block may start wearing off overnight but the long acting medicine may last for 3-4 days.  The nerve block wearing off can be a challenging period but please utilize your as needed pain medications to try and manage this period.    POST-OP MEDICATIONS- Multimodal approach to pain control In general your pain will be controlled with a combination of substances.  Prescriptions unless otherwise discussed are electronically sent to your pharmacy.  This is a carefully made plan we use to minimize narcotic use.     Meloxicam OR Celebrex - Anti-inflammatory medication taken on a scheduled basis Acetaminophen  - Non-narcotic pain medicine taken on a scheduled basis  Oxycodone  - This is a strong narcotic, to be used only on an "as needed" basis for pain. Aspirin  81mg  - This medicine is used to minimize the risk of blood clots after surgery. Zofran  -  take as needed for nausea  Meloxicam/Celebrex - these are anti-inflammatory and pain relievers.  Do not take additional ibuprofen, naproxen or other NSAID while taking this medicine.   FOLLOW-UP If you develop a Fever (>101.5), Redness or Drainage from the surgical incision site, please call our office to arrange for an evaluation. Please call the office to schedule a follow-up appointment  for a wound check, 7-10 days post-operatively.   NARCOTIC MANAGEMENT  Per [facility] clinic policy, our goal is ensure optimal postoperative pain control with a multimodal pain management strategy.  For all [facility] patients, our goal is to wean post-operative narcotic medications by 6 weeks post-operatively.   If this is not possible due to utilization of pain medication prior to surgery, your [facility] doctor will support your acute post-operative pain control for the first 6 weeks postoperatively, with a plan to transition you back to your primary pain team  following that. Ronnie Walker will work to ensure a Therapist, occupational.

## 2024-01-15 NOTE — Discharge Summary (Signed)
  Patient ID: SHAWNDALE KILPATRICK MRN: [id number] DOB/AGE: 1951-01-12 73 y.o.  Admit date: 01/14/2024 Discharge date: 01/15/2024  Admission Diagnoses: Right shoulder rotator cuff tear, irreparable  Discharge Diagnoses:  Principal Problem:   Rotator cuff arthropathy of right shoulder   Past Medical History:  Diagnosis Date   Arthritis    CVA (cerebral vascular accident) (HCC)    08/2019   Glaucoma    Hyperlipidemia      Procedures Performed: Right Reverse Shoulder Arthroplasty  Discharged Condition: good  Hospital Course: Patient brought in as an outpatient for surgery.  Tolerated procedure well.  Was kept for monitoring overnight for pain control and medical monitoring postop and was found to be stable for DC home the morning after surgery.  Patient was evaluated by OT/OT prior to discharge.  Patient was instructed on specific activity restrictions and all questions were answered.  Patient was discharged on POD#1 in stable condition.  They will contact the clinic if they have any concerns upon discharge.    Consults: None  Significant Diagnostic Studies: No additional pertinent studies  Treatments: Surgery  Discharge Exam:  Vitals:   01/14/24 2302 01/15/24 0424  BP: 120/67 136/78  Pulse: 74 71  Resp: 18 18  Temp: 98.2 F (36.8 C) 97.7 F (36.5 C)  SpO2: 95% 95%     Alert and oriented, no acute distress  Sling and ice machine fitting appropriately. Dressing is clean dry and intact Active motion intact throughout the hand Sensation intact in the axillary nerve distribution. Fingers are warm and well perfused   CBC    Latest Ref Rng & Units 01/14/2024   10:38 AM 01/11/2024    9:11 AM 08/24/2019    6:10 AM  CBC  WBC 4.0 - 10.5 K/uL  6.2  5.9   Hemoglobin 13.0 - 17.0 g/dL 86.2  85.0  85.6   Hematocrit 39.0 - 52.0 % 40.4  44.6  42.2   Platelets 150 - 400 K/uL  200  195         Disposition: Discharge disposition: 01-Home or Self  Care        Allergies as of 01/15/2024   No Known Allergies      Medication List     TAKE these medications    acetaminophen  500 MG tablet Commonly known as: TYLENOL  Take 2 tablets (1,000 mg total) by mouth every 8 (eight) hours for 14 days.   aspirin  EC 81 MG tablet Take 1 tablet (81 mg total) by mouth in the morning and at bedtime. Swallow whole. What changed: when to take this   celecoxib 100 MG capsule Commonly known as: CeleBREX Take 1 capsule (100 mg total) by mouth daily for 14 days.   oxyCODONE  5 MG immediate release tablet Commonly known as: Roxicodone  Take 1 tablet (5 mg total) by mouth every 4 (four) hours as needed for up to 7 days.   rosuvastatin  40 MG tablet Commonly known as: CRESTOR  Take 1 tablet (40 mg total) by mouth daily.   timolol 0.5 % ophthalmic solution Commonly known as: TIMOPTIC Place 1 drop into the left eye every morning.

## 2024-01-15 NOTE — Evaluation (Signed)
 Physical Therapy Evaluation Patient Details Name: Ronnie Walker MRN: [id number] DOB: [birth year] Today's Date: 01/15/2024  History of Present Illness  73 y.o. male admitted on 01/14/24 s/p R Reverse Shoulder Arthroplasty. Pt sustained an irreparable rotator cuff tear. PMH significant for Glaucoma, CVA, and arthritis.   Clinical Impression  Pt. Presented R. Shoulder RC repair, pt. Tolerated session well and was highly motivated about returning home with wife. Pt. Presented with no pain, and was able to independently perform; bed mobility, transfer from sit to stand and and normal gait speed during ambulation. Nursing staff was notified on pt. Status. Patient will benefit from continued skilled physical therapy in outpatient and recommended venue below to increase strength, balance, endurance for safe ADLs and gait.       If plan is discharge home, recommend the following: Assistance with cooking/housework;Assist for transportation   Can travel by private vehicle   Yes     Equipment Recommendations None recommended by PT  Recommendations for Other Services       Functional Status Assessment Patient has had a recent decline in their functional status and demonstrates the ability to make significant improvements in function in a reasonable and predictable amount of time.     Precautions / Restrictions Precautions Precautions: Shoulder Type of Shoulder Precautions: Reverse Shoulder Protocol Shoulder Interventions: Shoulder abduction pillow;Shoulder sling/immobilizer;Off for dressing/bathing/exercises Recall of Precautions/Restrictions: Intact Required Braces or Orthoses: Sling Restrictions Weight Bearing Restrictions Per Provider Order: Yes RUE Weight Bearing Per Provider Order: Non weight bearing      Mobility  Bed Mobility Overal bed mobility: Modified Independent, Independent             General bed mobility comments: Pt. was bale to perform bed mboility in flat bed, and  HOB elevated independently    Transfers Overall transfer level: Independent Equipment used: None               General transfer comment: WNL, given the precatuions of R shoulder    Ambulation/Gait Ambulation/Gait assistance: Independent Gait Distance (Feet): 200 Feet Assistive device: None Gait Pattern/deviations: WFL(Within Functional Limits) Gait velocity: normal     General Gait Details: Pt. was had normal gait speed with no assistance needed nor AD  Stairs            Wheelchair Mobility     Tilt Bed    Modified Rankin (Stroke Patients Only)       Balance Overall balance assessment: Independent, Modified Independent                                           Pertinent Vitals/Pain Pain Assessment Pain Assessment: No/denies pain Pain Score: 0-No pain    Home Living Family/patient expects to be discharged to:: Private residence Living Arrangements: Spouse/significant other Available Help at Discharge: Family;Available 24 hours/day Type of Home: House Home Access: Stairs to enter Entrance Stairs-Rails: Can reach both;Left;Right Entrance Stairs-Number of Steps: 3   Home Layout: Able to live on main level with bedroom/bathroom Home Equipment: Rolling Walker (2 wheels) Additional Comments: Wife can assist husband if need be    Prior Function Prior Level of Function : Independent/Modified Independent;Working/employed;Driving             Mobility Comments: No AD needed ADLs Comments: Pt works for [employer] and is Independent with all ADL's.     Extremity/Trunk Assessment   Upper Extremity Assessment  Upper Extremity Assessment: Defer to OT evaluation RUE Deficits / Details: s/p surgery in sling - No shoulder mobility per protocol RUE: Unable to fully assess due to immobilization RUE Sensation: WNL RUE Coordination: WNL    Lower Extremity Assessment Lower Extremity Assessment: Overall WFL for tasks assessed    Cervical  / Trunk Assessment Cervical / Trunk Assessment: Normal  Communication   Communication Communication: No apparent difficulties Factors Affecting Communication: Hearing impaired;Other (comment) (Hearing aides at home (mild HOH))    Cognition Arousal: Alert Behavior During Therapy: WFL for tasks assessed/performed   PT - Cognitive impairments: No apparent impairments                         Following commands: Intact       Cueing Cueing Techniques: Verbal cues     General Comments General comments (skin integrity, edema, etc.): VSS on RA, sling properly donned\    Exercises     Assessment/Plan    PT Assessment Patient needs continued PT services  PT Problem List Decreased range of motion;Decreased activity tolerance;Decreased mobility       PT Treatment Interventions DME instruction;Gait training;Stair training;Functional mobility training;Therapeutic activities;Therapeutic exercise;Balance training;Patient/family education    PT Goals (Current goals can be found in the Care Plan section)  Acute Rehab PT Goals Patient Stated Goal: pt. wants to return home with family PT Goal Formulation: With patient/family Time For Goal Achievement: 01/23/24 Potential to Achieve Goals: Good    Frequency Min 3X/week     Co-evaluation               AM-PAC PT 6 Clicks Mobility  Outcome Measure Help needed turning from your back to your side while in a flat bed without using bedrails?: None Help needed moving from lying on your back to sitting on the side of a flat bed without using bedrails?: None Help needed moving to and from a bed to a chair (including a wheelchair)?: None Help needed standing up from a chair using your arms (e.g., wheelchair or bedside chair)?: None Help needed to walk in hospital room?: None Help needed climbing 3-5 steps with a railing? : None 6 Click Score: 24    End of Session   Activity Tolerance: Patient tolerated treatment  well Patient left: in bed;with family/visitor present;with call bell/phone within reach Nurse Communication: Mobility status PT Visit Diagnosis: Pain Pain - Right/Left: Right Pain - part of body: Shoulder (Due to shoulder repair)    Time: 9178-9158 PT Time Calculation (min) (ACUTE ONLY): 20 min   Charges:   PT Evaluation $PT Eval Moderate Complexity: 1 Mod PT Treatments $Therapeutic Activity: 8-22 mins PT General Charges $$ ACUTE PT VISIT: 1 Visit         Azim Gillingham, SPT

## 2024-01-15 NOTE — Evaluation (Signed)
 Occupational Therapy Evaluation Patient Details Name: Ronnie Walker MRN: [id number] DOB: Apr 16, 1950 Today's Date: 01/15/2024   History of Present Illness   73 y.o. male admitted on 01/14/24 s/p R Reverse Shoulder Arthroplasty. Pt sustained an irreparable rotator cuff tear. PMH significant for Glaucoma, CVA, and arthritis.     Clinical Impressions Pt admitted for concerns listed above. PTA pt reported that he was independent and working up until his surgery. At this time, pt presents in shoulder immobilizer with mild pain noted. He demonstrates mod I functional mobility and reviewed compensatory strategies for all BADL's to insure highest level of independence upon returning home. Recommending Outpatient OT following MD's protocol. Pt has no further acute OT needs and will be discharged.      If plan is discharge home, recommend the following:   A lot of help with bathing/dressing/bathroom;Assistance with cooking/housework     Functional Status Assessment   Patient has had a recent decline in their functional status and demonstrates the ability to make significant improvements in function in a reasonable and predictable amount of time.     Equipment Recommendations   None recommended by OT     Recommendations for Other Services         Precautions/Restrictions   Precautions Precautions: Shoulder Type of Shoulder Precautions: Reverse Shoulder Protocol Shoulder Interventions: Shoulder abduction pillow;Shoulder sling/immobilizer;Off for dressing/bathing/exercises Recall of Precautions/Restrictions: Intact Required Braces or Orthoses: Sling Restrictions Weight Bearing Restrictions Per Provider Order: Yes RUE Weight Bearing Per Provider Order: Non weight bearing     Mobility Bed Mobility Overal bed mobility: Modified Independent             General bed mobility comments: No concerns or limitations - will be sleeping in recliner for a few weeks     Transfers Overall transfer level: Modified independent Equipment used: None               General transfer comment: No concerns      Balance Overall balance assessment: Modified Independent                                         ADL either performed or assessed with clinical judgement   ADL Overall ADL's : Needs assistance/impaired                                       General ADL Comments: Due to sling, pt will require min to mod assist with ADL's at this time, unable to complete IADL's until released by MD     Vision Baseline Vision/History: 3 Glaucoma Ability to See in Adequate Light: 0 Adequate Patient Visual Report: No change from baseline Vision Assessment?: No apparent visual deficits     Perception         Praxis         Pertinent Vitals/Pain Pain Assessment Pain Assessment: 0-10 Pain Score: 2  Pain Location: Shoulder Pain Descriptors / Indicators: Aching, Discomfort Pain Intervention(s): Limited activity within patient's tolerance, Monitored during session, Repositioned     Extremity/Trunk Assessment Upper Extremity Assessment Upper Extremity Assessment: Right hand dominant;RUE deficits/detail RUE Deficits / Details: s/p surgery in sling - No shoulder mobility per protocol RUE: Unable to fully assess due to immobilization RUE Sensation: WNL RUE Coordination: WNL   Lower Extremity Assessment Lower Extremity Assessment:  Overall [facility] for tasks assessed   Cervical / Trunk Assessment Cervical / Trunk Assessment: Normal   Communication Communication Communication: No apparent difficulties   Cognition Arousal: Alert Behavior During Therapy: WFL for tasks assessed/performed Cognition: No apparent impairments                               Following commands: Intact       Cueing  General Comments   Cueing Techniques: Verbal cues  VSS on RA, sling properly donned\   Exercises      Shoulder Instructions      Home Living Family/patient expects to be discharged to:: Private residence Living Arrangements: Spouse/significant other Available Help at Discharge: Family;Available 24 hours/day Type of Home: House Home Access: Stairs to enter [employer] of Steps: 3   Home Layout: Able to live on main level with bedroom/bathroom     Bathroom Shower/Tub: Chief Strategy Officer: Standard Bathroom Accessibility: Yes How Accessible: Accessible via walker Home Equipment: Rolling Walker (2 wheels)          Prior Functioning/Environment Prior Level of Function : Independent/Modified Independent;Driving;Working/employed             Mobility Comments: No AD needed ADLs Comments: Pt works for [employer] and is Independent with all ADL's.    OT Problem List: Decreased strength;Decreased range of motion;Decreased activity tolerance;Decreased knowledge of precautions;Pain;Impaired UE functional use   OT Treatment/Interventions:        OT Goals(Current goals can be found in the care plan section)   Acute Rehab OT Goals Patient Stated Goal: To get back to work OT Goal Formulation: With patient Time For Goal Achievement: 01/15/24 Potential to Achieve Goals: Good   OT Frequency:       Co-evaluation              AM-PAC OT 6 Clicks Daily Activity     Outcome Measure Help from another person eating meals?: None Help from another person taking care of personal grooming?: A Little Help from another person toileting, which includes using toliet, bedpan, or urinal?: A Little Help from another person bathing (including washing, rinsing, drying)?: A Lot Help from another person to put on and taking off regular upper body clothing?: A Lot Help from another person to put on and taking off regular lower body clothing?: A Lot 6 Click Score: 16   End of Session Nurse Communication: Mobility status  Activity Tolerance: Patient tolerated  treatment well Patient left: in bed;with call bell/phone within reach;with nursing/sitter in room;with family/visitor present  OT Visit Diagnosis: Muscle weakness (generalized) (M62.81);Pain Pain - Right/Left: Right Pain - part of body: Shoulder                Time: 9040-8974 OT Time Calculation (min): 26 min Charges:  OT General Charges $OT Visit: 1 Visit OT Evaluation $OT Eval Moderate Complexity: 1 Mod OT Treatments $Self Care/Home Management : 8-22 mins  Valentin Nightingale, OTR/L [facility] Acute Rehab  Jerryl Holzhauer Elane Nightingale 01/15/2024, 10:36 AM

## 2024-01-18 ENCOUNTER — Encounter: Payer: Self-pay | Admitting: Radiology

## 2024-01-19 ENCOUNTER — Encounter: Admitting: Family Medicine

## 2024-01-19 ENCOUNTER — Encounter (HOSPITAL_COMMUNITY): Payer: Self-pay | Admitting: Orthopedic Surgery

## 2024-01-26 ENCOUNTER — Encounter: Payer: Self-pay | Admitting: Orthopedic Surgery

## 2024-01-26 ENCOUNTER — Ambulatory Visit: Admitting: Orthopedic Surgery

## 2024-01-26 ENCOUNTER — Other Ambulatory Visit (INDEPENDENT_AMBULATORY_CARE_PROVIDER_SITE_OTHER): Payer: Self-pay

## 2024-01-26 DIAGNOSIS — Z96611 Presence of right artificial shoulder joint: Secondary | ICD-10-CM

## 2024-01-26 DIAGNOSIS — M75121 Complete rotator cuff tear or rupture of right shoulder, not specified as traumatic: Secondary | ICD-10-CM

## 2024-01-26 NOTE — Progress Notes (Signed)
 Orthopaedic Postop Note  Assessment: Ronnie Walker is a 73 y.o. male s/p Right Reverse Shoulder Arthroplasty  DOS: 01/14/2024  Plan: Sutures were trimmed, steri strips were placed Ok to remove the abduction pillow; can stop using the sling around 4 weeks postop Physical therapy prescription and protocol provided Anticipated progression discussed, XR reviewed in clinic Continue to monitor numbness and tingling to the thumb and index finger Follow up 4 weeks    Follow-up: Return in about 4 weeks (around 02/23/2024).  XR at next visit: Right shoulder  Subjective:  Chief Complaint  Patient presents with   Routine Post Op    R RSA DOS 01/14/24    History of Present Illness: Ronnie Walker is a 73 y.o. male who presents following the above stated procedure.  Surgery was approximately 2 weeks ago.  He is doing very well.  He states he is only taking Tylenol  at this point.  He is down to 1 Tylenol  per day.  He has not seen therapy, but this was set up today.  He has some numbness and tingling to the index finger and thumb.  Full motion in the hand otherwise.  He is using the stress ball  Review of Systems: No fevers or chills + numbness or tingling No Chest Pain No shortness of breath   Objective: There were no vitals taken for this visit.  Physical Exam:  Alert and oriented, no acute distress  Surgical incision is healing well, no surrounding erythema or drainage Sensation intact in the axillary nerve distribution Active motion intact in the hand 2+ radial pulse Sensation intact throughout the hand Tolerates gentle range of motion of the shoulder - passive forward flexion to 90, passive abduction to 90   IMAGING: I personally ordered and reviewed the following images:  XR of the Right shoulder obtained in clinic today and demonstrate Reverse Shoulder Arthroplasty  with implants in good position.  No evidence of acute injury or subsidence of implants.  Alignment remains  unchanged compared to immediate postop XR.  Impression: Right shoulder arthroplasty in good position   Oneil DELENA Horde, MD 01/26/2024 12:53 PM

## 2024-02-03 DIAGNOSIS — H401122 Primary open-angle glaucoma, left eye, moderate stage: Secondary | ICD-10-CM | POA: Diagnosis not present

## 2024-02-03 DIAGNOSIS — H01001 Unspecified blepharitis right upper eyelid: Secondary | ICD-10-CM | POA: Diagnosis not present

## 2024-02-03 DIAGNOSIS — H01002 Unspecified blepharitis right lower eyelid: Secondary | ICD-10-CM | POA: Diagnosis not present

## 2024-02-03 DIAGNOSIS — H01004 Unspecified blepharitis left upper eyelid: Secondary | ICD-10-CM | POA: Diagnosis not present

## 2024-02-09 ENCOUNTER — Encounter (HOSPITAL_COMMUNITY): Payer: Self-pay | Admitting: Occupational Therapy

## 2024-02-09 ENCOUNTER — Ambulatory Visit (HOSPITAL_COMMUNITY): Attending: Orthopedic Surgery | Admitting: Occupational Therapy

## 2024-02-09 DIAGNOSIS — M75121 Complete rotator cuff tear or rupture of right shoulder, not specified as traumatic: Secondary | ICD-10-CM | POA: Diagnosis not present

## 2024-02-09 DIAGNOSIS — M25611 Stiffness of right shoulder, not elsewhere classified: Secondary | ICD-10-CM | POA: Insufficient documentation

## 2024-02-09 DIAGNOSIS — M25511 Pain in right shoulder: Secondary | ICD-10-CM | POA: Diagnosis not present

## 2024-02-09 DIAGNOSIS — R29898 Other symptoms and signs involving the musculoskeletal system: Secondary | ICD-10-CM | POA: Insufficient documentation

## 2024-02-09 DIAGNOSIS — Z96611 Presence of right artificial shoulder joint: Secondary | ICD-10-CM | POA: Diagnosis not present

## 2024-02-09 NOTE — Patient Instructions (Signed)

## 2024-02-09 NOTE — Therapy (Signed)
 OUTPATIENT OCCUPATIONAL THERAPY ORTHO EVALUATION  Patient Name: Ronnie Walker MRN: [id number] DOB:05/12/1950, 73 y.o., male Today's Date: 02/09/2024   END OF SESSION:  OT End of Session - 02/09/24 1719     Visit Number 1    Number of Visits 16    Date for Recertification  04/15/24    Authorization Type BCBS    Authorization Time Period Requesting 16 visits    Authorization - Visit Number 1    Authorization - Number of Visits 16    OT Start Time 0909    OT Stop Time 0948    OT Time Calculation (min) 39 min    Activity Tolerance Patient tolerated treatment well    Behavior During Therapy [facility] for tasks assessed/performed          Past Medical History:  Diagnosis Date   Arthritis    CVA (cerebral vascular accident) (HCC)    08/2019   Glaucoma    Hyperlipidemia    Past Surgical History:  Procedure Laterality Date   BACK SURGERY     COLONOSCOPY WITH PROPOFOL  N/A 03/27/2020   Procedure: COLONOSCOPY WITH PROPOFOL ;  Surgeon: Cindie Carlin POUR, DO;  Location: [facility];  Service: Endoscopy;  Laterality: N/A;  12:45pm   HYDROCELE EXCISION Right 05/24/2020   Procedure: HYDROCELECTOMY ADULT;  Surgeon: Sherrilee Belvie CROME, MD;  Location: [facility] OR[facility];  Service: Urology;  Laterality: Right;   INGUINAL HERNIA REPAIR Right    POLYPECTOMY  03/27/2020   Procedure: POLYPECTOMY INTESTINAL;  Surgeon: Cindie Carlin POUR, DO;  Location: [facility];  Service: Endoscopy;;   REVERSE SHOULDER ARTHROPLASTY Right 01/14/2024   Procedure: ARTHROPLASTY, SHOULDER, TOTAL, REVERSE;  Surgeon: Onesimo Oneil LABOR, MD;  Location: [facility];  Service: Orthopedics;  Laterality: Right;   VASECTOMY     Patient Active Problem List   Diagnosis Date Noted   Rotator cuff arthropathy of right shoulder 01/14/2024   LRTI (lower respiratory tract infection) 03/29/2021   Preventative health care 02/14/2020   Encounter for screening colonoscopy 02/14/2020   Elevated blood pressure reading    Gastroesophageal reflux disease     Acute ischemic stroke (HCC) 08/23/2019   Hyperlipidemia 02/16/2013   Hyperglycemia 02/16/2013    PCP: Alphonsa Hamilton, MD REFERRING PROVIDER: Onesimo Oneil, MD  ONSET DATE: 01/14/24  REFERRING DIAG:  F24.878 (ICD-10-CM) - Complete tear of right rotator cuff, unspecified whether traumatic  Z96.611 (ICD-10-CM) - Status post reverse arthroplasty of right shoulder    THERAPY DIAG:  Acute pain of right shoulder  Shoulder stiffness, right  Other symptoms and signs involving the musculoskeletal system  Rationale for Evaluation and Treatment: Rehabilitation  SUBJECTIVE:   SUBJECTIVE STATEMENT: It hasn't been too bad Pt accompanied by: self  PERTINENT HISTORY: Pt s/p R Reverse total shoulder arthroplasty, No significant PMH  PRECAUTIONS: Shoulder  WEIGHT BEARING RESTRICTIONS: Yes >1lbs  PAIN:  Are you having pain? No  FALLS: Has patient fallen in last 6 months? No  PLOF: Independent  PATIENT GOALS: To get my arm over my head  NEXT MD VISIT: 02/23/24  OBJECTIVE:   HAND DOMINANCE: Right  ADLs: Overall ADLs: Pt unable to pull up his jeans or don a belt. He also reports only being able to use L hand for bathing. Pt unable to lift anything at this time for cooking, cleaning , and yard work.   FUNCTIONAL OUTCOME MEASURES: Upper Extremity Functional Scale (UEFS): 14/80   Extreme difficulty/unable (0), Quite a bit of difficulty (1), Moderate difficulty (2), Little difficulty (  3), No difficulty (4) Survey date:  02/09/24  Any of your usual work, household or school activities 0  2. Your usual hobbies, recreational/sport activities 0   3. Lifting a bag of groceries to waist level 0   4. Lifting a bag of groceries above your head 0  5. Grooming your hair 0  6. Pushing up on your hands (I.e. from bathtub or chair) 0  7. Preparing food (I.e. peeling/cutting) 0  8. Driving  2  9. Vacuuming, sweeping, or raking 0  10. Dressing  2  11. Doing up buttons 2  12. Using  tools/appliances 0  13. Opening doors 2  14. Cleaning  1  15. Tying or lacing shoes 0  16. Sleeping  2  17. Laundering clothes (I.e. washing, ironing, folding) 0  18. Opening a jar 3  19. Throwing a ball 0  20. Carrying a small suitcase with your affected limb.  0  Score total:  14/80     UPPER EXTREMITY ROM:       Assessed in supine, er/IR adducted  Passive ROM Right eval  Shoulder flexion 131  Shoulder abduction 124  Shoulder internal rotation 90  Shoulder external rotation 29  (Blank rows = not tested)    UPPER EXTREMITY MMT:     Assessed in seated, er/IR adducted  MMT Right eval  Shoulder flexion   Shoulder abduction   Shoulder internal rotation   Shoulder external rotation   (Blank rows = not tested)  SENSATION: Mild tingling noted periodically  EDEMA: No swelling noted at this time.   OBSERVATIONS: moderate fascial restrictions along the biceps, deltoid, axillary region, and trapezius   TODAY'S TREATMENT:                                                                                                                              DATE:   02/09/24 -Pendulums: 2x30 -Table Slides: flexion, abduction, x10    PATIENT EDUCATION: Education details: Pendulums and Table Slides Person educated: Patient Education method: Explanation, Demonstration, and Handouts Education comprehension: verbalized understanding, returned demonstration, and needs further education  HOME EXERCISE PROGRAM: 11/25: Pendulums and Table Slides  GOALS: Goals reviewed with patient? Yes   SHORT TERM GOALS: Target date: 03/10/24  Pt will be provided with and educated on HEP to improve mobility in RUE required for use during ADL completion.   Goal status: INITIAL  2.  Pt will increase RUE P/ROM by 20 degrees to improve ability to use RUE during dressing tasks with minimal compensatory techniques.   Goal status: INITIAL  3.  Pt will increase RUE strength to 3+/5 to improve  ability to reach for items at waist to chest height during bathing and grooming tasks.   Goal status: INITIAL  LONG TERM GOALS: Target date: 04/10/24  Pt will decrease pain in RUE to 3/10 or less to improve ability to sleep for 2+ consecutive hours without waking due to pain.  Goal status: INITIAL  2.  Pt will decrease RUE fascial restrictions to min amounts or less to improve mobility required for functional reaching tasks.   Goal status: INITIAL  3.  Pt will increase RUE A/ROM by 25 degrees to improve ability to use RUE when reaching overhead or behind back during dressing and bathing tasks.   Goal status: INITIAL  4.  Pt will increase RUE strength to 5/5 or greater to improve ability to use RUE when lifting or carrying items during meal preparation/housework/yardwork tasks.   Goal status: INITIAL  5.  Pt will return to highest level of function using RUE as dominant during functional task completion.   Goal status: INITIAL   ASSESSMENT:  CLINICAL IMPRESSION: Patient is a 73 y.o. male who was seen today for occupational therapy evaluation for s/p R Reverse Total Shoulder. Pt presents with increased pain and fascial restrictions, decreased ROM, strength, and functional use of the RUE.   PERFORMANCE DEFICITS: in functional skills including in functional skills including ADLs, IADLs, coordination, tone, ROM, strength, pain, fascial restrictions, muscle spasms, and UE functional use.  IMPAIRMENTS: are limiting patient from ADLs, IADLs, rest and sleep, work, leisure, and social participation.   COMORBIDITIES: has no other co-morbidities that affects occupational performance. Patient will benefit from skilled OT to address above impairments and improve overall function.  MODIFICATION OR ASSISTANCE TO COMPLETE EVALUATION: Min-Moderate modification of tasks or assist with assess necessary to complete an evaluation.  OT OCCUPATIONAL PROFILE AND HISTORY: Detailed assessment: Review of  records and additional review of physical, cognitive, psychosocial history related to current functional performance.  CLINICAL DECISION MAKING: Moderate - several treatment options, min-mod task modification necessary  REHAB POTENTIAL: Good  EVALUATION COMPLEXITY: Low      PLAN:  OT FREQUENCY: 2x/week  OT DURATION: 8 weeks  PLANNED INTERVENTIONS: 97168 OT Re-evaluation, 97535 self care/ADL training, 02889 therapeutic exercise, 97530 therapeutic activity, 97112 neuromuscular re-education, 97140 manual therapy, 97035 ultrasound, 97010 moist heat, 97032 electrical stimulation (manual), passive range of motion, functional mobility training, energy conservation, coping strategies training, patient/family education, and DME and/or AE instructions  RECOMMENDED OTHER SERVICES: N/A  CONSULTED AND AGREED WITH PLAN OF CARE: Patient  PLAN FOR NEXT SESSION: Manual Therapy, P/ROM, Wall washes, Thumb tacs  Valentin Thelbert [facility] Outpatient Rehab [phone] Valentin Jillyn Thelbert, OT 2024/02/12, 5:36 PM    Managed Medicaid Authorization Request Treatment Start Date: 2024/02/12  Visit Dx Codes: M25.511, M25.611, R29.898  Functional Tool Score: UEFS: 14/80  For all possible CPT codes, reference the Planned Interventions line above.     Check all conditions that are expected to impact treatment: {Conditions expected to impact treatment:None of these apply   If treatment provided at initial evaluation, no treatment charged due to lack of authorization.

## 2024-02-15 ENCOUNTER — Encounter (HOSPITAL_COMMUNITY): Payer: Self-pay | Admitting: Occupational Therapy

## 2024-02-15 ENCOUNTER — Ambulatory Visit (HOSPITAL_COMMUNITY): Admitting: Occupational Therapy

## 2024-02-15 DIAGNOSIS — M25611 Stiffness of right shoulder, not elsewhere classified: Secondary | ICD-10-CM | POA: Insufficient documentation

## 2024-02-15 DIAGNOSIS — R29898 Other symptoms and signs involving the musculoskeletal system: Secondary | ICD-10-CM | POA: Diagnosis not present

## 2024-02-15 DIAGNOSIS — M25511 Pain in right shoulder: Secondary | ICD-10-CM | POA: Insufficient documentation

## 2024-02-15 NOTE — Therapy (Signed)
 OUTPATIENT OCCUPATIONAL THERAPY ORTHO TREATMENT NOTE  Patient Name: Ronnie Walker MRN: [id number] DOB:1950-06-30, 73 y.o., male Today's Date: 02/15/2024   END OF SESSION:  OT End of Session - 02/15/24 1201     Visit Number 2    Number of Visits 16    Date for Recertification  04/15/24    Authorization Type BCBS    Authorization Time Period no Auth required    Authorization - Number of Visits --    OT Start Time 1053    OT Stop Time 1131    OT Time Calculation (min) 38 min    Activity Tolerance Patient tolerated treatment well    Behavior During Therapy WFL for tasks assessed/performed          Past Medical History:  Diagnosis Date   Arthritis    CVA (cerebral vascular accident) (HCC)    08/2019   Glaucoma    Hyperlipidemia    Past Surgical History:  Procedure Laterality Date   BACK SURGERY     COLONOSCOPY WITH PROPOFOL  N/A 03/27/2020   Procedure: COLONOSCOPY WITH PROPOFOL ;  Surgeon: Cindie Carlin POUR, DO;  [facility];  Service: Endoscopy;  Laterality: N/A;  12:45pm   HYDROCELE EXCISION Right 05/24/2020   Procedure: HYDROCELECTOMY ADULT;  Surgeon: Sherrilee Belvie CROME, MD;  Location: [facility];  Service: Urology;  Laterality: Right;   INGUINAL HERNIA REPAIR Right    POLYPECTOMY  03/27/2020   Procedure: POLYPECTOMY INTESTINAL;  Surgeon: Cindie Carlin POUR, DO;  [facility]: [facility];  Service: Endoscopy;;   REVERSE SHOULDER ARTHROPLASTY Right 01/14/2024   Procedure: ARTHROPLASTY, SHOULDER, TOTAL, REVERSE;  Surgeon: Onesimo Oneil LABOR, MD;  Location: [facility];  Service: Orthopedics;  Laterality: Right;   VASECTOMY     Patient Active Problem List   Diagnosis Date Noted   Rotator cuff arthropathy of right shoulder 01/14/2024   LRTI (lower respiratory tract infection) 03/29/2021   Preventative health care 02/14/2020   Encounter for screening colonoscopy 02/14/2020   Elevated blood pressure reading    Gastroesophageal reflux disease    Acute ischemic stroke (HCC)  08/23/2019   Hyperlipidemia 02/16/2013   Hyperglycemia 02/16/2013    PCP: Alphonsa Hamilton, MD REFERRING PROVIDER: Onesimo Oneil, MD  ONSET DATE: 01/14/24  REFERRING DIAG:  F24.878 (ICD-10-CM) - Complete tear of right rotator cuff, unspecified whether traumatic  Z96.611 (ICD-10-CM) - Status post reverse arthroplasty of right shoulder    THERAPY DIAG:  Acute pain of right shoulder  Shoulder stiffness, right  Other symptoms and signs involving the musculoskeletal system  Rationale for Evaluation and Treatment: Rehabilitation  SUBJECTIVE:   SUBJECTIVE STATEMENT: It's a little sore today Pt accompanied by: self  PERTINENT HISTORY: Pt s/p R Reverse total shoulder arthroplasty, No significant PMH  PRECAUTIONS: Shoulder  WEIGHT BEARING RESTRICTIONS: Yes >1lbs  PAIN:  Are you having pain? No  FALLS: Has patient fallen in last 6 months? No  PLOF: Independent  PATIENT GOALS: To get my arm over my head  NEXT MD VISIT: 02/23/24  OBJECTIVE:   HAND DOMINANCE: Right  ADLs: Overall ADLs: Pt unable to pull up his jeans or don a belt. He also reports only being able to use L hand for bathing. Pt unable to lift anything at this time for cooking, cleaning , and yard work.   FUNCTIONAL OUTCOME MEASURES: Upper Extremity Functional Scale (UEFS): 14/80   Extreme difficulty/unable (0), Quite a bit of difficulty (1), Moderate difficulty (2), Little difficulty (3), No difficulty (4) Survey date:  02/09/24  Any of your usual work, household or school activities 0  2. Your usual hobbies, recreational/sport activities 0   3. Lifting a bag of groceries to waist level 0   4. Lifting a bag of groceries above your head 0  5. Grooming your hair 0  6. Pushing up on your hands (I.e. from bathtub or chair) 0  7. Preparing food (I.e. peeling/cutting) 0  8. Driving  2  9. Vacuuming, sweeping, or raking 0  10. Dressing  2  11. Doing up buttons 2  12. Using tools/appliances 0  13. Opening  doors 2  14. Cleaning  1  15. Tying or lacing shoes 0  16. Sleeping  2  17. Laundering clothes (I.e. washing, ironing, folding) 0  18. Opening a jar 3  19. Throwing a ball 0  20. Carrying a small suitcase with your affected limb.  0  Score total:  14/80     UPPER EXTREMITY ROM:       Assessed in supine, er/IR adducted  Passive ROM Right eval  Shoulder flexion 131  Shoulder abduction 124  Shoulder internal rotation 90  Shoulder external rotation 29  (Blank rows = not tested)    UPPER EXTREMITY MMT:     Assessed in seated, er/IR adducted  MMT Right eval  Shoulder flexion   Shoulder abduction   Shoulder internal rotation   Shoulder external rotation   (Blank rows = not tested)  SENSATION: Mild tingling noted periodically  EDEMA: No swelling noted at this time.   OBSERVATIONS: moderate fascial restrictions along the biceps, deltoid, axillary region, and trapezius   TODAY'S TREATMENT:                                                                                                                              DATE:   02/15/24 -Manual Therapy: myofascial release and trigger point applied to biceps, trapezius, and scapular region in order to reduce fascial restrictions and pain, as well as improve ROM.  -P/ROM: supine -flexion, abduction, er/IR, x10 -Ball Rolls: flexion, abduction, x10 -Low level wall wash 2x60 -Thumb tacs, 1x45  02/09/24 -Pendulums: 2x30 -Table Slides: flexion, abduction, x10    PATIENT EDUCATION: Education details: Pendulums and Table Slides Person educated: Patient Education method: Explanation, Demonstration, and Handouts Education comprehension: verbalized understanding, returned demonstration, and needs further education  HOME EXERCISE PROGRAM: 11/25: Pendulums and Table Slides  GOALS: Goals reviewed with patient? Yes   SHORT TERM GOALS: Target date: 03/10/24  Pt will be provided with and educated on HEP to improve mobility in  RUE required for use during ADL completion.   Goal status: INITIAL  2.  Pt will increase RUE P/ROM by 20 degrees to improve ability to use RUE during dressing tasks with minimal compensatory techniques.   Goal status: INITIAL  3.  Pt will increase RUE strength to 3+/5 to improve ability to reach for items at waist to chest height during bathing  and grooming tasks.   Goal status: INITIAL  LONG TERM GOALS: Target date: 04/10/24  Pt will decrease pain in RUE to 3/10 or less to improve ability to sleep for 2+ consecutive hours without waking due to pain.   Goal status: INITIAL  2.  Pt will decrease RUE fascial restrictions to min amounts or less to improve mobility required for functional reaching tasks.   Goal status: INITIAL  3.  Pt will increase RUE A/ROM by 25 degrees to improve ability to use RUE when reaching overhead or behind back during dressing and bathing tasks.   Goal status: INITIAL  4.  Pt will increase RUE strength to 5/5 or greater to improve ability to use RUE when lifting or carrying items during meal preparation/housework/yardwork tasks.   Goal status: INITIAL  5.  Pt will return to highest level of function using RUE as dominant during functional task completion.   Goal status: INITIAL   ASSESSMENT:  CLINICAL IMPRESSION: Pt arrived to first OT treatment session with mild pain and severe fascial restrictions, addressed with manual therapy and passive stretching/ROM. He was able to tolerate P/ROM to 75% of full ROM with mild to moderate discomfort. OT providing verbal and tactile cuing for positioning and technique throughout session.   PERFORMANCE DEFICITS: in functional skills including in functional skills including ADLs, IADLs, coordination, tone, ROM, strength, pain, fascial restrictions, muscle spasms, and UE functional use.   PLAN:  OT FREQUENCY: 2x/week  OT DURATION: 8 weeks  PLANNED INTERVENTIONS: 97168 OT Re-evaluation, 97535 self care/ADL  training, 02889 therapeutic exercise, 97530 therapeutic activity, 97112 neuromuscular re-education, 97140 manual therapy, 97035 ultrasound, 97010 moist heat, 97032 electrical stimulation (manual), passive range of motion, functional mobility training, energy conservation, coping strategies training, patient/family education, and DME and/or AE instructions  RECOMMENDED OTHER SERVICES: N/A  CONSULTED AND AGREED WITH PLAN OF CARE: Patient  PLAN FOR NEXT SESSION: Manual Therapy, P/ROM, Wall washes, Thumb tacs  Valentin Nightingale, OTR/L [facility] [phone] Valentin Jillyn Nightingale, OT 02/15/2024, 12:03 PM    Managed Medicaid Authorization Request Treatment Start Date: 02/14/2024  Visit Dx Codes: M25.511, M25.611, R29.898  Functional Tool Score: UEFS: 14/80  For all possible CPT codes, reference the Planned Interventions line above.     Check all conditions that are expected to impact treatment: {Conditions expected to impact treatment:None of these apply   If treatment provided at initial evaluation, no treatment charged due to lack of authorization.

## 2024-02-16 ENCOUNTER — Ambulatory Visit (INDEPENDENT_AMBULATORY_CARE_PROVIDER_SITE_OTHER): Admitting: Family Medicine

## 2024-02-16 ENCOUNTER — Encounter: Payer: Self-pay | Admitting: Family Medicine

## 2024-02-16 VITALS — BP 120/68 | HR 74 | Temp 97.9°F | Ht 65.0 in | Wt 165.0 lb

## 2024-02-16 DIAGNOSIS — Z0001 Encounter for general adult medical examination with abnormal findings: Secondary | ICD-10-CM | POA: Diagnosis not present

## 2024-02-16 DIAGNOSIS — Z79899 Other long term (current) drug therapy: Secondary | ICD-10-CM

## 2024-02-16 DIAGNOSIS — Z0000 Encounter for general adult medical examination without abnormal findings: Secondary | ICD-10-CM

## 2024-02-16 DIAGNOSIS — K429 Umbilical hernia without obstruction or gangrene: Secondary | ICD-10-CM

## 2024-02-16 DIAGNOSIS — E119 Type 2 diabetes mellitus without complications: Secondary | ICD-10-CM

## 2024-02-16 DIAGNOSIS — Z125 Encounter for screening for malignant neoplasm of prostate: Secondary | ICD-10-CM

## 2024-02-16 DIAGNOSIS — E1169 Type 2 diabetes mellitus with other specified complication: Secondary | ICD-10-CM

## 2024-02-16 MED ORDER — ROSUVASTATIN CALCIUM 40 MG PO TABS: 40.0000 mg | ORAL_TABLET | Freq: Every day | ORAL | 5 refills | Status: AC

## 2024-02-16 NOTE — Progress Notes (Signed)
   Subjective:    Patient ID: Ronnie Walker, male    DOB: Jan 06, 1951, 73 y.o.   MRN: [id number]  HPI The patient comes in today for a wellness visit.  He recently had shoulder surgery Doing physical therapy currently Takes his cholesterol medicine regular basis  A review of their health history was completed.  A review of medications was also completed.  Any needed refills; cholesterol meds  Eating habits: good  Falls/  MVA accidents in past few months: none  Regular exercise: yes, walk and move around  Specialist pt sees on regular basis: none  Preventative health issues were discussed.   Additional concerns: None Occasionally gets pain from the umbilical hernia no vomiting with that Occasionally the seborrheic keratosis itches  Review of Systems     Objective:   Physical Exam General-in no acute distress Eyes-no discharge Lungs-respiratory rate normal, CTA CV-no murmurs,RRR Extremities skin warm dry no edema Neuro grossly normal Behavior normal, alert Seborrheic keratosis noted in the back of the right arm also noted several spots on his back none of them were cancerous Small umbilical hernia easily reducible  Results for orders placed or performed during the hospital encounter of 01/14/24  Hemoglobin and hematocrit, blood   Collection Time: 01/14/24 10:38 AM  Result Value Ref Range   Hemoglobin 13.7 13.0 - 17.0 g/dL   HCT 59.5 60.9 - 47.9 %   Labs were reviewed with patient in detail, additional labs ordered     Assessment & Plan:  1. Well adult exam (Primary) Adult wellness-complete.wellness physical was conducted today. Importance of diet and exercise were discussed in detail.  Importance of stress reduction and healthy living were discussed.  In addition to this a discussion regarding safety was also covered.  We also reviewed over immunizations and gave recommendations regarding current immunization needed for age.   In addition to this additional  areas were also touched on including: Preventative health exams needed:  Colonoscopy 2027  Patient was advised yearly wellness exam   2. Screening PSA (prostate specific antigen) Lab ordered - PSA  3. High risk medication use Lab ordered  4. Diabetes mellitus without complication (HCC) Healthy diet lab ordered - HgB A1c - Urine Microalbumin w/creat. ratio  5. Hyperlipidemia associated with type 2 diabetes mellitus (HCC) Goal is to get LDL below 70 and if possible below 55 lab order - rosuvastatin  (CRESTOR ) 40 MG tablet; Take 1 tablet (40 mg total) by mouth daily.  Dispense: 30 tablet; Refill: 5 - Lipid Profile  Within 6 months

## 2024-02-18 ENCOUNTER — Ambulatory Visit (HOSPITAL_COMMUNITY): Admitting: Occupational Therapy

## 2024-02-18 ENCOUNTER — Encounter (HOSPITAL_COMMUNITY): Payer: Self-pay | Admitting: Occupational Therapy

## 2024-02-18 DIAGNOSIS — M25511 Pain in right shoulder: Secondary | ICD-10-CM

## 2024-02-18 DIAGNOSIS — R29898 Other symptoms and signs involving the musculoskeletal system: Secondary | ICD-10-CM | POA: Diagnosis not present

## 2024-02-18 DIAGNOSIS — M25611 Stiffness of right shoulder, not elsewhere classified: Secondary | ICD-10-CM

## 2024-02-18 NOTE — Therapy (Signed)
 OUTPATIENT OCCUPATIONAL THERAPY ORTHO TREATMENT NOTE  Patient Name: Ronnie Walker MRN: [id number] DOB:May 21, 1950, 73 y.o., male Today's Date: 02/18/2024   END OF SESSION:  OT End of Session - 02/18/24 0947     Visit Number 3    Number of Visits 16    Date for Recertification  04/15/24    Authorization Type BCBS    Authorization Time Period no Auth required    OT Start Time 0902    OT Stop Time 0940    OT Time Calculation (min) 38 min    Activity Tolerance Patient tolerated treatment well    Behavior During Therapy [facility] for tasks assessed/performed           Past Medical History:  Diagnosis Date   Arthritis    CVA (cerebral vascular accident) (HCC)    08/2019   Glaucoma    Hyperlipidemia    Past Surgical History:  Procedure Laterality Date   BACK SURGERY     COLONOSCOPY WITH PROPOFOL  N/A 03/27/2020   Procedure: COLONOSCOPY WITH PROPOFOL ;  Surgeon: Cindie Carlin POUR, DO;  [facility];  Service: Endoscopy;  Laterality: N/A;  12:45pm   HYDROCELE EXCISION Right 05/24/2020   Procedure: HYDROCELECTOMY ADULT;  Surgeon: Sherrilee Belvie CROME, MD;  Location: [facility];  Service: Urology;  Laterality: Right;   INGUINAL HERNIA REPAIR Right    POLYPECTOMY  03/27/2020   Procedure: POLYPECTOMY INTESTINAL;  Surgeon: Cindie Carlin POUR, DO;  [facility];  Service: Endoscopy;;   REVERSE SHOULDER ARTHROPLASTY Right 01/14/2024   Procedure: ARTHROPLASTY, SHOULDER, TOTAL, REVERSE;  Surgeon: Onesimo Oneil LABOR, MD;  Location: [facility];  Service: Orthopedics;  Laterality: Right;   VASECTOMY     Patient Active Problem List   Diagnosis Date Noted   Rotator cuff arthropathy of right shoulder 01/14/2024   LRTI (lower respiratory tract infection) 03/29/2021   Preventative health care 02/14/2020   Encounter for screening colonoscopy 02/14/2020   Elevated blood pressure reading    Gastroesophageal reflux disease    Acute ischemic stroke (HCC) 08/23/2019   Hyperlipidemia 02/16/2013    Hyperglycemia 02/16/2013    PCP: Alphonsa Hamilton, MD REFERRING PROVIDER: Onesimo Oneil, MD  ONSET DATE: 01/14/24  REFERRING DIAG:  F24.878 (ICD-10-CM) - Complete tear of right rotator cuff, unspecified whether traumatic  Z96.611 (ICD-10-CM) - Status post reverse arthroplasty of right shoulder    THERAPY DIAG:  Acute pain of right shoulder  Shoulder stiffness, right  Other symptoms and signs involving the musculoskeletal system  Rationale for Evaluation and Treatment: Rehabilitation  SUBJECTIVE:   SUBJECTIVE STATEMENT: It's a little sore today Pt accompanied by: self  PERTINENT HISTORY: Pt s/p R Reverse total shoulder arthroplasty, No significant PMH  PRECAUTIONS: Shoulder  WEIGHT BEARING RESTRICTIONS: Yes >1lbs  PAIN:  Are you having pain? No  FALLS: Has patient fallen in last 6 months? No  PLOF: Independent  PATIENT GOALS: To get my arm over my head  NEXT MD VISIT: 02/23/24  OBJECTIVE:   HAND DOMINANCE: Right  ADLs: Overall ADLs: Pt unable to pull up his jeans or don a belt. He also reports only being able to use L hand for bathing. Pt unable to lift anything at this time for cooking, cleaning , and yard work.   FUNCTIONAL OUTCOME MEASURES: Upper Extremity Functional Scale (UEFS): 14/80   Extreme difficulty/unable (0), Quite a bit of difficulty (1), Moderate difficulty (2), Little difficulty (3), No difficulty (4) Survey date:  02/09/24  Any of your usual work, household  or school activities 0  2. Your usual hobbies, recreational/sport activities 0   3. Lifting a bag of groceries to waist level 0   4. Lifting a bag of groceries above your head 0  5. Grooming your hair 0  6. Pushing up on your hands (I.e. from bathtub or chair) 0  7. Preparing food (I.e. peeling/cutting) 0  8. Driving  2  9. Vacuuming, sweeping, or raking 0  10. Dressing  2  11. Doing up buttons 2  12. Using tools/appliances 0  13. Opening doors 2  14. Cleaning  1  15. Tying or  lacing shoes 0  16. Sleeping  2  17. Laundering clothes (I.e. washing, ironing, folding) 0  18. Opening a jar 3  19. Throwing a ball 0  20. Carrying a small suitcase with your affected limb.  0  Score total:  14/80     UPPER EXTREMITY ROM:       Assessed in supine, er/IR adducted  Passive ROM Right eval  Shoulder flexion 131  Shoulder abduction 124  Shoulder internal rotation 90  Shoulder external rotation 29  (Blank rows = not tested)    UPPER EXTREMITY MMT:     Assessed in seated, er/IR adducted  MMT Right eval  Shoulder flexion   Shoulder abduction   Shoulder internal rotation   Shoulder external rotation   (Blank rows = not tested)  SENSATION: Mild tingling noted periodically  EDEMA: No swelling noted at this time.   OBSERVATIONS: moderate fascial restrictions along the biceps, deltoid, axillary region, and trapezius   TODAY'S TREATMENT:                                                                                                                              DATE:   02/18/24 -Manual Therapy: myofascial release and trigger point applied to biceps, trapezius, and scapular region in order to reduce fascial restrictions and pain, as well as improve ROM.  -P/ROM: supine -flexion, abduction, er/IR, x10 -Low level wall wash 2x60 -Thumb tacs, 2x45 -Ball Rolls: flexion, abduction, x10 -Scapular ROM: elevation/depression, retraction, extension, x10  02/15/24 -Manual Therapy: myofascial release and trigger point applied to biceps, trapezius, and scapular region in order to reduce fascial restrictions and pain, as well as improve ROM.  -P/ROM: supine -flexion, abduction, er/IR, x10 -Ball Rolls: flexion, abduction, x10 -Low level wall wash 2x60 -Thumb tacs, 1x45  02/09/24 -Pendulums: 2x30 -Table Slides: flexion, abduction, x10    PATIENT EDUCATION: Education details: Pendulums and Table Slides Person educated: Patient Education method: Explanation,  Demonstration, and Handouts Education comprehension: verbalized understanding, returned demonstration, and needs further education  HOME EXERCISE PROGRAM: 11/25: Pendulums and Table Slides  GOALS: Goals reviewed with patient? Yes   SHORT TERM GOALS: Target date: 03/10/24  Pt will be provided with and educated on HEP to improve mobility in RUE required for use during ADL completion.   Goal status: INITIAL  2.  Pt will increase  RUE P/ROM by 20 degrees to improve ability to use RUE during dressing tasks with minimal compensatory techniques.   Goal status: INITIAL  3.  Pt will increase RUE strength to 3+/5 to improve ability to reach for items at waist to chest height during bathing and grooming tasks.   Goal status: INITIAL  LONG TERM GOALS: Target date: 04/10/24  Pt will decrease pain in RUE to 3/10 or less to improve ability to sleep for 2+ consecutive hours without waking due to pain.   Goal status: INITIAL  2.  Pt will decrease RUE fascial restrictions to min amounts or less to improve mobility required for functional reaching tasks.   Goal status: INITIAL  3.  Pt will increase RUE A/ROM by 25 degrees to improve ability to use RUE when reaching overhead or behind back during dressing and bathing tasks.   Goal status: INITIAL  4.  Pt will increase RUE strength to 5/5 or greater to improve ability to use RUE when lifting or carrying items during meal preparation/housework/yardwork tasks.   Goal status: INITIAL  5.  Pt will return to highest level of function using RUE as dominant during functional task completion.   Goal status: INITIAL   ASSESSMENT:  CLINICAL IMPRESSION: This session pt reports minimal pain and good follow through with his HEP. He is able to achieve 90% of full ROM passively and only having mild pain/discomfort in his end ranges. With increased repetitions of exercises this session he is fatiguing quickly, requiring multiple rest breaks. OT providing  verbal and tactile cuing for positioning and technique.   PERFORMANCE DEFICITS: in functional skills including in functional skills including ADLs, IADLs, coordination, tone, ROM, strength, pain, fascial restrictions, muscle spasms, and UE functional use.   PLAN:  OT FREQUENCY: 2x/week  OT DURATION: 8 weeks  PLANNED INTERVENTIONS: 97168 OT Re-evaluation, 97535 self care/ADL training, 02889 therapeutic exercise, 97530 therapeutic activity, 97112 neuromuscular re-education, 97140 manual therapy, 97035 ultrasound, 97010 moist heat, 97032 electrical stimulation (manual), passive range of motion, functional mobility training, energy conservation, coping strategies training, patient/family education, and DME and/or AE instructions  RECOMMENDED OTHER SERVICES: N/A  CONSULTED AND AGREED WITH PLAN OF CARE: Patient  PLAN FOR NEXT SESSION: Manual Therapy, P/ROM, Wall washes, Thumb tacs  Valentin Nightingale, OTR/L [facility] [phone] Valentin Jillyn Nightingale, OT 02/18/2024, 9:48 AM    Managed Medicaid Authorization Request Treatment Start Date: March 01, 2024  Visit Dx Codes: M25.511, M25.611, R29.898  Functional Tool Score: UEFS: 14/80  For all possible CPT codes, reference the Planned Interventions line above.     Check all conditions that are expected to impact treatment: {Conditions expected to impact treatment:None of these apply   If treatment provided at initial evaluation, no treatment charged due to lack of authorization.

## 2024-02-23 ENCOUNTER — Other Ambulatory Visit: Payer: Self-pay

## 2024-02-23 ENCOUNTER — Encounter: Payer: Self-pay | Admitting: Orthopedic Surgery

## 2024-02-23 ENCOUNTER — Ambulatory Visit: Admitting: Orthopedic Surgery

## 2024-02-23 DIAGNOSIS — Z96611 Presence of right artificial shoulder joint: Secondary | ICD-10-CM

## 2024-02-23 NOTE — Progress Notes (Signed)
 Orthopaedic Postop Note  Assessment: JOY HAEGELE is a 73 y.o. male s/p Right Reverse Shoulder Arthroplasty  DOS: 01/14/2024  Plan: Mr. Clayburn is doing well.  He is not having any pain other than following therapy.  He is progressing appropriately.  He is happy with his results.  No issues.  Follow-up in 6 weeks.   Follow-up: Return in about 6 weeks (around 04/05/2024).  XR at next visit: Right shoulder  Subjective:  Chief Complaint  Patient presents with   Routine Post Op    R RSA DOS 01/14/24    History of Present Illness: Ronnie Walker is a 73 y.o. male who presents following the above stated procedure.  Surgery was approximately 6 weeks ago.  He continues work with therapy.  He is not having pain on a regular basis.  He notes some stiffness following therapy.  He is pleased with his results.  He is currently dealing with some mild symptoms associated with the URI.    Review of Systems: No fevers or chills No numbness or tingling No Chest Pain No shortness of breath   Objective: There were no vitals taken for this visit.  Physical Exam:  Alert and oriented, no acute distress  Surgical incision is healed.  No surrounding erythema or drainage.  Sensation intact in axillary nerve distribution.  Active forward flexion to 90 degrees.  Active abduction to 90 degrees.  Passive forward flexion to 160 degrees.  He tolerates approximately 30 degrees of external rotation passively.  IMAGING: I personally ordered and reviewed the following images:  X-rays of the right shoulder were obtained in clinic today.  These are compared to prior x-rays.  No change in overall alignment.  There is no subsidence.  No acute fractures.  Shoulder is reduced.  No bony lesions.  Impression: Stable right reverse shoulder arthroplasty    Oneil DELENA Horde, MD 02/23/2024 10:55 AM

## 2024-02-24 ENCOUNTER — Ambulatory Visit (HOSPITAL_COMMUNITY): Admitting: Occupational Therapy

## 2024-02-24 ENCOUNTER — Encounter (HOSPITAL_COMMUNITY): Payer: Self-pay | Admitting: Occupational Therapy

## 2024-02-24 DIAGNOSIS — M25611 Stiffness of right shoulder, not elsewhere classified: Secondary | ICD-10-CM

## 2024-02-24 DIAGNOSIS — R29898 Other symptoms and signs involving the musculoskeletal system: Secondary | ICD-10-CM | POA: Diagnosis not present

## 2024-02-24 DIAGNOSIS — M25511 Pain in right shoulder: Secondary | ICD-10-CM | POA: Diagnosis not present

## 2024-02-24 NOTE — Therapy (Signed)
 OUTPATIENT OCCUPATIONAL THERAPY ORTHO TREATMENT NOTE  Patient Name: Ronnie Walker MRN: [id number] DOB:06/06/1950, 73 y.o., male Today's Date: 02/24/2024   END OF SESSION:  OT End of Session - 02/24/24 1050     Visit Number 4    Number of Visits 16    Date for Recertification  04/15/24    Authorization Type BCBS    Authorization Time Period no Auth required    OT Start Time 0906    OT Stop Time 0945    OT Time Calculation (min) 39 min    Activity Tolerance Patient tolerated treatment well    Behavior During Therapy [facility] for tasks assessed/performed          Past Medical History:  Diagnosis Date   Arthritis    CVA (cerebral vascular accident) (HCC)    08/2019   Glaucoma    Hyperlipidemia    Past Surgical History:  Procedure Laterality Date   BACK SURGERY     COLONOSCOPY WITH PROPOFOL  N/A 03/27/2020   Procedure: COLONOSCOPY WITH PROPOFOL ;  Surgeon: Cindie Carlin POUR, DO;  [facility];  Service: Endoscopy;  Laterality: N/A;  12:45pm   HYDROCELE EXCISION Right 05/24/2020   Procedure: HYDROCELECTOMY ADULT;  Surgeon: Sherrilee Belvie CROME, MD;  Location: [facility];  Service: Urology;  Laterality: Right;   INGUINAL HERNIA REPAIR Right    POLYPECTOMY  03/27/2020   Procedure: POLYPECTOMY INTESTINAL;  Surgeon: Cindie Carlin POUR, DO;  Location: [facility];  Service: Endoscopy;;   REVERSE SHOULDER ARTHROPLASTY Right 01/14/2024   Procedure: ARTHROPLASTY, SHOULDER, TOTAL, REVERSE;  Surgeon: Onesimo Oneil LABOR, MD;  Location: [facility];  Service: Orthopedics;  Laterality: Right;   VASECTOMY     Patient Active Problem List   Diagnosis Date Noted   Rotator cuff arthropathy of right shoulder 01/14/2024   LRTI (lower respiratory tract infection) 03/29/2021   Preventative health care 02/14/2020   Encounter for screening colonoscopy 02/14/2020   Elevated blood pressure reading    Gastroesophageal reflux disease    Acute ischemic stroke (HCC) 08/23/2019   Hyperlipidemia 02/16/2013    Hyperglycemia 02/16/2013    PCP: Alphonsa Hamilton, MD REFERRING PROVIDER: Onesimo Oneil, MD  ONSET DATE: 01/14/24  REFERRING DIAG:  F24.878 (ICD-10-CM) - Complete tear of right rotator cuff, unspecified whether traumatic  Z96.611 (ICD-10-CM) - Status post reverse arthroplasty of right shoulder    THERAPY DIAG:  Shoulder stiffness, right  Acute pain of right shoulder  Other symptoms and signs involving the musculoskeletal system  Rationale for Evaluation and Treatment: Rehabilitation  SUBJECTIVE:   SUBJECTIVE STATEMENT: It is really tight today. Pt accompanied by: self  PERTINENT HISTORY: Pt s/p R Reverse total shoulder arthroplasty, No significant PMH  PRECAUTIONS: Shoulder  WEIGHT BEARING RESTRICTIONS: Yes >1lbs  PAIN:  Are you having pain? No  FALLS: Has patient fallen in last 6 months? No  PLOF: Independent  PATIENT GOALS: To get my arm over my head  NEXT MD VISIT: 04/07/24  OBJECTIVE:   HAND DOMINANCE: Right  ADLs: Overall ADLs: Pt unable to pull up his jeans or don a belt. He also reports only being able to use L hand for bathing. Pt unable to lift anything at this time for cooking, cleaning , and yard work.   FUNCTIONAL OUTCOME MEASURES: Upper Extremity Functional Scale (UEFS): 14/80   Extreme difficulty/unable (0), Quite a bit of difficulty (1), Moderate difficulty (2), Little difficulty (3), No difficulty (4) Survey date:  02/09/24  Any of your usual work, household or  school activities 0  2. Your usual hobbies, recreational/sport activities 0   3. Lifting a bag of groceries to waist level 0   4. Lifting a bag of groceries above your head 0  5. Grooming your hair 0  6. Pushing up on your hands (I.e. from bathtub or chair) 0  7. Preparing food (I.e. peeling/cutting) 0  8. Driving  2  9. Vacuuming, sweeping, or raking 0  10. Dressing  2  11. Doing up buttons 2  12. Using tools/appliances 0  13. Opening doors 2  14. Cleaning  1  15. Tying or  lacing shoes 0  16. Sleeping  2  17. Laundering clothes (I.e. washing, ironing, folding) 0  18. Opening a jar 3  19. Throwing a ball 0  20. Carrying a small suitcase with your affected limb.  0  Score total:  14/80     UPPER EXTREMITY ROM:       Assessed in supine, er/IR adducted  Passive ROM Right eval  Shoulder flexion 131  Shoulder abduction 124  Shoulder internal rotation 90  Shoulder external rotation 29  (Blank rows = not tested)    UPPER EXTREMITY MMT:     Assessed in seated, er/IR adducted  MMT Right eval  Shoulder flexion   Shoulder abduction   Shoulder internal rotation   Shoulder external rotation   (Blank rows = not tested)  SENSATION: Mild tingling noted periodically  EDEMA: No swelling noted at this time.   OBSERVATIONS: moderate fascial restrictions along the biceps, deltoid, axillary region, and trapezius   TODAY'S TREATMENT:                                                                                                                              DATE:   02/24/24 -Manual Therapy: myofascial release and trigger point applied to biceps, trapezius, and scapular region in order to reduce fascial restrictions and pain, as well as improve ROM.  -P/ROM: supine -flexion, abduction, er/IR, x10 -AA/ROM: supine - flexion, abduction, protraction, horizontal abduction, er/IR, x10 -Scapular ROM: elevation/depression, retraction, extension, x10 -prot/ret/elev/dep, low level, x60 -Pulleys: flexion, abduction, x10  02/18/24 -Manual Therapy: myofascial release and trigger point applied to biceps, trapezius, and scapular region in order to reduce fascial restrictions and pain, as well as improve ROM.  -P/ROM: supine -flexion, abduction, er/IR, x10 -Low level wall wash 2x60 -Thumb tacs, 2x45 -Ball Rolls: flexion, abduction, x10 -Scapular ROM: elevation/depression, retraction, extension, x10  02/15/24 -Manual Therapy: myofascial release and trigger point  applied to biceps, trapezius, and scapular region in order to reduce fascial restrictions and pain, as well as improve ROM.  -P/ROM: supine -flexion, abduction, er/IR, x10 -Ball Rolls: flexion, abduction, x10 -Low level wall wash 2x60 -Thumb tacs, 1x45    PATIENT EDUCATION: Education details: AA/ROM Person educated: Patient Education method: Programmer, Multimedia, Facilities Manager, and Handouts Education comprehension: verbalized understanding, returned demonstration, and needs further education  HOME EXERCISE PROGRAM: 11/25: Pendulums and Table Slides  12/10: AA/ROM  GOALS: Goals reviewed with patient? Yes   SHORT TERM GOALS: Target date: 03/10/24  Pt will be provided with and educated on HEP to improve mobility in RUE required for use during ADL completion.   Goal status: IN PROGRESS  2.  Pt will increase RUE P/ROM by 20 degrees to improve ability to use RUE during dressing tasks with minimal compensatory techniques.   Goal status: IN PROGRESS  3.  Pt will increase RUE strength to 3+/5 to improve ability to reach for items at waist to chest height during bathing and grooming tasks.   Goal status: IN PROGRESS  LONG TERM GOALS: Target date: 04/10/24  Pt will decrease pain in RUE to 3/10 or less to improve ability to sleep for 2+ consecutive hours without waking due to pain.   Goal status: IN PROGRESS  2.  Pt will decrease RUE fascial restrictions to min amounts or less to improve mobility required for functional reaching tasks.   Goal status: IN PROGRESS  3.  Pt will increase RUE A/ROM by 25 degrees to improve ability to use RUE when reaching overhead or behind back during dressing and bathing tasks.   Goal status: IN PROGRESS  4.  Pt will increase RUE strength to 5/5 or greater to improve ability to use RUE when lifting or carrying items during meal preparation/housework/yardwork tasks.   Goal status: IN PROGRESS  5.  Pt will return to highest level of function using RUE as  dominant during functional task completion.   Goal status: IN PROGRESS   ASSESSMENT:  CLINICAL IMPRESSION: Pt having increased tightness this session, causing increased pain/discomfort with all movements. Following protocol, OT continued with manual therapy to address fascial restrictions, then moved on to P/ROM where he is able to tolerate up to 80% of full ROM. Pt then started AA/ROM this session with good mobility, achieving close to 75% of full ROM with mild discomfort in end ranges. OT providing verbal and tactile cuing for positioning and technique throughout session.   PERFORMANCE DEFICITS: in functional skills including in functional skills including ADLs, IADLs, coordination, tone, ROM, strength, pain, fascial restrictions, muscle spasms, and UE functional use.   PLAN:  OT FREQUENCY: 2x/week  OT DURATION: 8 weeks  PLANNED INTERVENTIONS: 97168 OT Re-evaluation, 97535 self care/ADL training, 02889 therapeutic exercise, 97530 therapeutic activity, 97112 neuromuscular re-education, 97140 manual therapy, 97035 ultrasound, 97010 moist heat, 97032 electrical stimulation (manual), passive range of motion, functional mobility training, energy conservation, coping strategies training, patient/family education, and DME and/or AE instructions  RECOMMENDED OTHER SERVICES: N/A  CONSULTED AND AGREED WITH PLAN OF CARE: Patient  PLAN FOR NEXT SESSION: Manual Therapy, P/ROM, Wall washes, Thumb tacs  Valentin Thelbert [facility] [phone] Valentin Jillyn Thelbert, OT 02/24/2024, 10:51 AM    Managed Medicaid Authorization Request Treatment Start Date: 02-24-24  Visit Dx Codes: M25.511, M25.611, R29.898  Functional Tool Score: UEFS: 14/80  For all possible CPT codes, reference the Planned Interventions line above.     Check all conditions that are expected to impact treatment: {Conditions expected to impact treatment:None of these apply   If treatment provided at  initial evaluation, no treatment charged due to lack of authorization.

## 2024-02-24 NOTE — Patient Instructions (Signed)

## 2024-02-26 ENCOUNTER — Ambulatory Visit (HOSPITAL_COMMUNITY): Admitting: Occupational Therapy

## 2024-02-26 ENCOUNTER — Encounter (HOSPITAL_COMMUNITY): Payer: Self-pay | Admitting: Occupational Therapy

## 2024-02-26 DIAGNOSIS — M25611 Stiffness of right shoulder, not elsewhere classified: Secondary | ICD-10-CM

## 2024-02-26 DIAGNOSIS — M25511 Pain in right shoulder: Secondary | ICD-10-CM

## 2024-02-26 DIAGNOSIS — R29898 Other symptoms and signs involving the musculoskeletal system: Secondary | ICD-10-CM

## 2024-02-26 NOTE — Therapy (Signed)
 OUTPATIENT OCCUPATIONAL THERAPY ORTHO TREATMENT NOTE  Patient Name: Ronnie Walker MRN: [id number] DOB:[birth year], 72 y.o., male Today's Date: 02/26/2024   END OF SESSION:  OT End of Session - 02/26/24 1020     Visit Number 5    Number of Visits 16    Date for Recertification  04/15/24    Authorization Type BCBS    Authorization Time Period no Auth required    Authorization - Visit Number 2    Authorization - Number of Visits 16    OT Start Time 0945    OT Stop Time 1025    OT Time Calculation (min) 40 min    Activity Tolerance Patient tolerated treatment well    Behavior During Therapy WFL for tasks assessed/performed           Past Medical History:  Diagnosis Date   Arthritis    CVA (cerebral vascular accident) (HCC)    08/2019   Glaucoma    Hyperlipidemia    Past Surgical History:  Procedure Laterality Date   BACK SURGERY     COLONOSCOPY WITH PROPOFOL  N/A 03/27/2020   Procedure: COLONOSCOPY WITH PROPOFOL ;  Surgeon: Cindie Carlin POUR, DO;  [facility];  Service: Endoscopy;  Laterality: N/A;  12:45pm   HYDROCELE EXCISION Right 05/24/2020   Procedure: HYDROCELECTOMY ADULT;  Surgeon: Sherrilee Belvie CROME, MD;  Location: [facility];  Service: Urology;  Laterality: Right;   INGUINAL HERNIA REPAIR Right    POLYPECTOMY  03/27/2020   Procedure: POLYPECTOMY INTESTINAL;  Surgeon: Cindie Carlin POUR, DO;  [facility];  Service: Endoscopy;;   REVERSE SHOULDER ARTHROPLASTY Right 01/14/2024   Procedure: ARTHROPLASTY, SHOULDER, TOTAL, REVERSE;  Surgeon: Onesimo Oneil LABOR, MD;  Location: [facility];  Service: Orthopedics;  Laterality: Right;   VASECTOMY     Patient Active Problem List   Diagnosis Date Noted   Rotator cuff arthropathy of right shoulder 01/14/2024   LRTI (lower respiratory tract infection) 03/29/2021   Preventative health care 02/14/2020   Encounter for screening colonoscopy 02/14/2020   Elevated blood pressure reading    Gastroesophageal reflux  disease    Acute ischemic stroke (HCC) 08/23/2019   Hyperlipidemia 02/16/2013   Hyperglycemia 02/16/2013    PCP: Alphonsa Hamilton, MD REFERRING PROVIDER: Onesimo Oneil, MD  ONSET DATE: 01/14/24  REFERRING DIAG:  F24.878 (ICD-10-CM) - Complete tear of right rotator cuff, unspecified whether traumatic  Z96.611 (ICD-10-CM) - Status post reverse arthroplasty of right shoulder    THERAPY DIAG:  Shoulder stiffness, right  Acute pain of right shoulder  Other symptoms and signs involving the musculoskeletal system  Rationale for Evaluation and Treatment: Rehabilitation  SUBJECTIVE:   SUBJECTIVE STATEMENT: I have been doing my exercises with the rod Pt accompanied by: self  PERTINENT HISTORY: Pt s/p R Reverse total shoulder arthroplasty, No significant PMH  PRECAUTIONS: Shoulder  WEIGHT BEARING RESTRICTIONS: Yes >1lbs  PAIN:  Are you having pain? No  FALLS: Has patient fallen in last 6 months? No  PLOF: Independent  PATIENT GOALS: To get my arm over my head  NEXT MD VISIT: 04/07/24  OBJECTIVE:   HAND DOMINANCE: Right  ADLs: Overall ADLs: Pt unable to pull up his jeans or don a belt. He also reports only being able to use L hand for bathing. Pt unable to lift anything at this time for cooking, cleaning , and yard work.   FUNCTIONAL OUTCOME MEASURES: Upper Extremity Functional Scale (UEFS): 14/80   Extreme difficulty/unable (0), Quite a bit of difficulty (  1), Moderate difficulty (2), Little difficulty (3), No difficulty (4) Survey date:  02/09/24  Any of your usual work, household or school activities 0  2. Your usual hobbies, recreational/sport activities 0   3. Lifting a bag of groceries to waist level 0   4. Lifting a bag of groceries above your head 0  5. Grooming your hair 0  6. Pushing up on your hands (I.e. from bathtub or chair) 0  7. Preparing food (I.e. peeling/cutting) 0  8. Driving  2  9. Vacuuming, sweeping, or raking 0  10. Dressing  2  11. Doing  up buttons 2  12. Using tools/appliances 0  13. Opening doors 2  14. Cleaning  1  15. Tying or lacing shoes 0  16. Sleeping  2  17. Laundering clothes (I.e. washing, ironing, folding) 0  18. Opening a jar 3  19. Throwing a ball 0  20. Carrying a small suitcase with your affected limb.  0  Score total:  14/80     UPPER EXTREMITY ROM:       Assessed in supine, er/IR adducted  Passive ROM Right eval  Shoulder flexion 131  Shoulder abduction 124  Shoulder internal rotation 90  Shoulder external rotation 29  (Blank rows = not tested)    UPPER EXTREMITY MMT:     Assessed in seated, er/IR adducted  MMT Right eval  Shoulder flexion   Shoulder abduction   Shoulder internal rotation   Shoulder external rotation   (Blank rows = not tested)  SENSATION: Mild tingling noted periodically  EDEMA: No swelling noted at this time.   OBSERVATIONS: moderate fascial restrictions along the biceps, deltoid, axillary region, and trapezius   TODAY'S TREATMENT:                                                                                                                              DATE:   02/26/24 -Manual Therapy: myofascial release and trigger point applied to biceps, trapezius, and scapular region in order to reduce fascial restrictions and pain, as well as improve ROM.  -P/ROM: supine -flexion, abduction, er/IR, x10 -AA/ROM: supine - flexion, abduction, protraction, horizontal abduction, er/IR, x10  -Ball Rolls: flexion, abduction, x10 -Wall washes: low level, flexion, abduction 1'    02/24/24 -Manual Therapy: myofascial release and trigger point applied to biceps, trapezius, and scapular region in order to reduce fascial restrictions and pain, as well as improve ROM.  -P/ROM: supine -flexion, abduction, er/IR, x10 -AA/ROM: supine - flexion, abduction, protraction, horizontal abduction, er/IR, x10 -Scapular ROM: elevation/depression, retraction, extension,  x10 -prot/ret/elev/dep, low level, x60 -Pulleys: flexion, abduction, x10  02/18/24 -Manual Therapy: myofascial release and trigger point applied to biceps, trapezius, and scapular region in order to reduce fascial restrictions and pain, as well as improve ROM.  -P/ROM: supine -flexion, abduction, er/IR, x10 -Low level wall wash 2x60 -Thumb tacs, 2x45 -Ball Rolls: flexion, abduction, x10 -Scapular ROM: elevation/depression, retraction, extension, x10  PATIENT EDUCATION: Education details: AA/ROM Person educated: Patient Education method: Programmer, Multimedia, Facilities Manager, and Handouts Education comprehension: verbalized understanding, returned demonstration, and needs further education  HOME EXERCISE PROGRAM: March 01, 2024: Pendulums and Table Slides 12/10: AA/ROM  GOALS: Goals reviewed with patient? Yes   SHORT TERM GOALS: Target date: 03/10/24  Pt will be provided with and educated on HEP to improve mobility in RUE required for use during ADL completion.   Goal status: IN PROGRESS  2.  Pt will increase RUE P/ROM by 20 degrees to improve ability to use RUE during dressing tasks with minimal compensatory techniques.   Goal status: IN PROGRESS  3.  Pt will increase RUE strength to 3+/5 to improve ability to reach for items at waist to chest height during bathing and grooming tasks.   Goal status: IN PROGRESS  LONG TERM GOALS: Target date: 04/10/24  Pt will decrease pain in RUE to 3/10 or less to improve ability to sleep for 2+ consecutive hours without waking due to pain.   Goal status: IN PROGRESS  2.  Pt will decrease RUE fascial restrictions to min amounts or less to improve mobility required for functional reaching tasks.   Goal status: IN PROGRESS  3.  Pt will increase RUE A/ROM by 25 degrees to improve ability to use RUE when reaching overhead or behind back during dressing and bathing tasks.   Goal status: IN PROGRESS  4.  Pt will increase RUE strength to 5/5 or greater  to improve ability to use RUE when lifting or carrying items during meal preparation/housework/yardwork tasks.   Goal status: IN PROGRESS  5.  Pt will return to highest level of function using RUE as dominant during functional task completion.   Goal status: IN PROGRESS   ASSESSMENT:  CLINICAL IMPRESSION: Pt stated that he has been completing AA/ROM at home and that it is going well. He reports that he is doing them in standing position- we discussed that doing them in supine may allow for increased ROM. Began session with manual to decrease fascial restrictions, noted moderate restrictions in trapezius and deltoid area. Added low level wall washed this treatment session. Ended session with therapy ball stretches. Pt would continue to benefit from OT services.   PERFORMANCE DEFICITS: in functional skills including in functional skills including ADLs, IADLs, coordination, tone, ROM, strength, pain, fascial restrictions, muscle spasms, and UE functional use.   PLAN:  OT FREQUENCY: 2x/week  OT DURATION: 8 weeks  PLANNED INTERVENTIONS: 97168 OT Re-evaluation, 97535 self care/ADL training, 02889 therapeutic exercise, 97530 therapeutic activity, 97112 neuromuscular re-education, 97140 manual therapy, 97035 ultrasound, 97010 moist heat, 97032 electrical stimulation (manual), passive range of motion, functional mobility training, energy conservation, coping strategies training, patient/family education, and DME and/or AE instructions  RECOMMENDED OTHER SERVICES: N/A  CONSULTED AND AGREED WITH PLAN OF CARE: Patient  PLAN FOR NEXT SESSION: Manual Therapy, P/ROM, Wall washes, Thumb tacs   [facility] [phone] Chiquita LOISE Sermon, OT 02/26/2024, 10:23 AM    Managed Medicaid Authorization Request Treatment Start Date: Mar 01, 2024  Visit Dx Codes: M25.511, M25.611, R29.898  Functional Tool Score: UEFS: 14/80  For all possible CPT codes, reference the Planned Interventions  line above.     Check all conditions that are expected to impact treatment: {Conditions expected to impact treatment:None of these apply   If treatment provided at initial evaluation, no treatment charged due to lack of authorization.

## 2024-03-01 ENCOUNTER — Ambulatory Visit (HOSPITAL_COMMUNITY): Admitting: Occupational Therapy

## 2024-03-01 ENCOUNTER — Encounter (HOSPITAL_COMMUNITY): Payer: Self-pay | Admitting: Occupational Therapy

## 2024-03-01 DIAGNOSIS — M25611 Stiffness of right shoulder, not elsewhere classified: Secondary | ICD-10-CM

## 2024-03-01 DIAGNOSIS — R29898 Other symptoms and signs involving the musculoskeletal system: Secondary | ICD-10-CM | POA: Diagnosis not present

## 2024-03-01 DIAGNOSIS — M25511 Pain in right shoulder: Secondary | ICD-10-CM | POA: Diagnosis not present

## 2024-03-01 NOTE — Therapy (Signed)
 OUTPATIENT OCCUPATIONAL THERAPY ORTHO TREATMENT NOTE  Patient Name: Ronnie Walker MRN: [id number] DOB:[birth year], 73 y.o., male Today's Date: 03/01/2024   END OF SESSION:  OT End of Session - 03/01/24 1508     Visit Number 6    Number of Visits 16    Date for Recertification  04/15/24    Authorization Type [employer]    Authorization Time Period no Auth required    OT Start Time 1048    OT Stop Time 1126    OT Time Calculation (min) 38 min    Activity Tolerance Patient tolerated treatment well    Behavior During Therapy WFL for tasks assessed/performed          Past Medical History:  Diagnosis Date   Arthritis    CVA (cerebral vascular accident) (HCC)    08/2019   Glaucoma    Hyperlipidemia    Past Surgical History:  Procedure Laterality Date   BACK SURGERY     COLONOSCOPY WITH PROPOFOL  N/A 03/27/2020   Procedure: COLONOSCOPY WITH PROPOFOL ;  Surgeon: Cindie Carlin POUR, DO;  Location: [facility];  Service: Endoscopy;  Laterality: N/A;  12:45pm   HYDROCELE EXCISION Right 05/24/2020   Procedure: HYDROCELECTOMY ADULT;  Surgeon: Sherrilee Belvie CROME, MD;  Location: [facility];  Service: Urology;  Laterality: Right;   INGUINAL HERNIA REPAIR Right    POLYPECTOMY  03/27/2020   Procedure: POLYPECTOMY INTESTINAL;  Surgeon: Cindie Carlin POUR, DO;  Location: [facility];  Service: Endoscopy;;   REVERSE SHOULDER ARTHROPLASTY Right 01/14/2024   Procedure: ARTHROPLASTY, SHOULDER, TOTAL, REVERSE;  Surgeon: Onesimo Oneil LABOR, MD;  Location: [facility];  Service: Orthopedics;  Laterality: Right;   VASECTOMY     Patient Active Problem List   Diagnosis Date Noted   Rotator cuff arthropathy of right shoulder 01/14/2024   LRTI (lower respiratory tract infection) 03/29/2021   Preventative health care 02/14/2020   Encounter for screening colonoscopy 02/14/2020   Elevated blood pressure reading    Gastroesophageal reflux disease    Acute ischemic stroke (HCC) 08/23/2019   Hyperlipidemia 02/16/2013    Hyperglycemia 02/16/2013    PCP: Alphonsa Hamilton, MD REFERRING PROVIDER: Onesimo Oneil, MD  ONSET DATE: 01/14/24  REFERRING DIAG:  F24.878 (ICD-10-CM) - Complete tear of right rotator cuff, unspecified whether traumatic  Z96.611 (ICD-10-CM) - Status post reverse arthroplasty of right shoulder    THERAPY DIAG:  Acute pain of right shoulder  Shoulder stiffness, right  Other symptoms and signs involving the musculoskeletal system  Rationale for Evaluation and Treatment: Rehabilitation  SUBJECTIVE:   SUBJECTIVE STATEMENT: Going out to the side really hurts Pt accompanied by: self  PERTINENT HISTORY: Pt s/p R Reverse total shoulder arthroplasty, No significant PMH  PRECAUTIONS: Shoulder  WEIGHT BEARING RESTRICTIONS: Yes >1lbs  PAIN:  Are you having pain? No  FALLS: Has patient fallen in last 6 months? No  PLOF: Independent  PATIENT GOALS: To get my arm over my head  NEXT MD VISIT: 04/07/24  OBJECTIVE:   HAND DOMINANCE: Right  ADLs: Overall ADLs: Pt unable to pull up his jeans or don a belt. He also reports only being able to use L hand for bathing. Pt unable to lift anything at this time for cooking, cleaning , and yard work.   FUNCTIONAL OUTCOME MEASURES: Upper Extremity Functional Scale (UEFS): 14/80   Extreme difficulty/unable (0), Quite a bit of difficulty (1), Moderate difficulty (2), Little difficulty (3), No difficulty (4) Survey date:  02/09/24  Any of your usual work,  household or school activities 0  2. Your usual hobbies, recreational/sport activities 0   3. Lifting a bag of groceries to waist level 0   4. Lifting a bag of groceries above your head 0  5. Grooming your hair 0  6. Pushing up on your hands (I.e. from bathtub or chair) 0  7. Preparing food (I.e. peeling/cutting) 0  8. Driving  2  9. Vacuuming, sweeping, or raking 0  10. Dressing  2  11. Doing up buttons 2  12. Using tools/appliances 0  13. Opening doors 2  14. Cleaning  1  15.  Tying or lacing shoes 0  16. Sleeping  2  17. Laundering clothes (I.e. washing, ironing, folding) 0  18. Opening a jar 3  19. Throwing a ball 0  20. Carrying a small suitcase with your affected limb.  0  Score total:  14/80     UPPER EXTREMITY ROM:       Assessed in supine, er/IR adducted  Passive ROM Right eval  Shoulder flexion 131  Shoulder abduction 124  Shoulder internal rotation 90  Shoulder external rotation 29  (Blank rows = not tested)    UPPER EXTREMITY MMT:     Assessed in seated, er/IR adducted  MMT Right eval  Shoulder flexion   Shoulder abduction   Shoulder internal rotation   Shoulder external rotation   (Blank rows = not tested)  SENSATION: Mild tingling noted periodically  EDEMA: No swelling noted at this time.   OBSERVATIONS: moderate fascial restrictions along the biceps, deltoid, axillary region, and trapezius   TODAY'S TREATMENT:                                                                                                                              DATE:   03/01/24 -Manual Therapy: myofascial release and trigger point applied to biceps, trapezius, and scapular region in order to reduce fascial restrictions and pain, as well as improve ROM.  -P/ROM: supine -flexion, abduction, er/IR, x10 -AA/ROM: supine - flexion, abduction, protraction, horizontal abduction, er/IR, x12 -A/ROM: supine - flexion, abduction, protraction, horizontal abduction, er/IR, x10 -Proximal Shoulder exercises: paddles, criss cross, circles both directions, x10 each  02/26/24 -Manual Therapy: myofascial release and trigger point applied to biceps, trapezius, and scapular region in order to reduce fascial restrictions and pain, as well as improve ROM.  -P/ROM: supine -flexion, abduction, er/IR, x10 -AA/ROM: supine - flexion, abduction, protraction, horizontal abduction, er/IR, x10  -Ball Rolls: flexion, abduction, x10 -Wall washes: low level, flexion, abduction 1'    02/24/24 -Manual Therapy: myofascial release and trigger point applied to biceps, trapezius, and scapular region in order to reduce fascial restrictions and pain, as well as improve ROM.  -P/ROM: supine -flexion, abduction, er/IR, x10 -AA/ROM: supine - flexion, abduction, protraction, horizontal abduction, er/IR, x10 -Scapular ROM: elevation/depression, retraction, extension, x10 -prot/ret/elev/dep, low level, x60 -Pulleys: flexion, abduction, x10   PATIENT EDUCATION: Education details: A/ROM Person educated: Patient Education  method: Explanation, Demonstration, and Handouts Education comprehension: verbalized understanding, returned demonstration, and needs further education  HOME EXERCISE PROGRAM: 02-11-2024: Pendulums and Table Slides 12/10: AA/ROM 12/16: A/ROM  GOALS: Goals reviewed with patient? Yes   SHORT TERM GOALS: Target date: 03/10/24  Pt will be provided with and educated on HEP to improve mobility in RUE required for use during ADL completion.   Goal status: IN PROGRESS  2.  Pt will increase RUE P/ROM by 20 degrees to improve ability to use RUE during dressing tasks with minimal compensatory techniques.   Goal status: IN PROGRESS  3.  Pt will increase RUE strength to 3+/5 to improve ability to reach for items at waist to chest height during bathing and grooming tasks.   Goal status: IN PROGRESS  LONG TERM GOALS: Target date: 04/10/24  Pt will decrease pain in RUE to 3/10 or less to improve ability to sleep for 2+ consecutive hours without waking due to pain.   Goal status: IN PROGRESS  2.  Pt will decrease RUE fascial restrictions to min amounts or less to improve mobility required for functional reaching tasks.   Goal status: IN PROGRESS  3.  Pt will increase RUE A/ROM by 25 degrees to improve ability to use RUE when reaching overhead or behind back during dressing and bathing tasks.   Goal status: IN PROGRESS  4.  Pt will increase RUE strength to 5/5  or greater to improve ability to use RUE when lifting or carrying items during meal preparation/housework/yardwork tasks.   Goal status: IN PROGRESS  5.  Pt will return to highest level of function using RUE as dominant during functional task completion.   Goal status: IN PROGRESS   ASSESSMENT:  CLINICAL IMPRESSION: Pt continues to demonstrate improving ROM and strength. He reports pain with abduction, however continues to be able to achieve full range in assisted and active ROM. OT had pt start A/ROM this session with good tolerance and 75% of full ROM. Verbal and tactile cuing provided for positioning and technique throughout session.   PERFORMANCE DEFICITS: in functional skills including in functional skills including ADLs, IADLs, coordination, tone, ROM, strength, pain, fascial restrictions, muscle spasms, and UE functional use.   PLAN:  OT FREQUENCY: 2x/week  OT DURATION: 8 weeks  PLANNED INTERVENTIONS: 97168 OT Re-evaluation, 97535 self care/ADL training, 02889 therapeutic exercise, 97530 therapeutic activity, 97112 neuromuscular re-education, 97140 manual therapy, 97035 ultrasound, 97010 moist heat, 97032 electrical stimulation (manual), passive range of motion, functional mobility training, energy conservation, coping strategies training, patient/family education, and DME and/or AE instructions  RECOMMENDED OTHER SERVICES: N/A  CONSULTED AND AGREED WITH PLAN OF CARE: Patient  PLAN FOR NEXT SESSION: Manual Therapy, P/ROM, Wall washes, Thumb tacs   [facility] [phone] Yaretzy Olazabal Jillyn Nightingale, OT 03/01/2024, 3:12 PM    Managed Medicaid Authorization Request Treatment Start Date: February 11, 2024  Visit Dx Codes: M25.511, M25.611, R29.898  Functional Tool Score: UEFS: 14/80  For all possible CPT codes, reference the Planned Interventions line above.     Check all conditions that are expected to impact treatment: {Conditions expected to impact treatment:None  of these apply   If treatment provided at initial evaluation, no treatment charged due to lack of authorization.

## 2024-03-01 NOTE — Patient Instructions (Signed)

## 2024-03-04 ENCOUNTER — Encounter (HOSPITAL_COMMUNITY): Payer: Self-pay | Admitting: Occupational Therapy

## 2024-03-04 ENCOUNTER — Ambulatory Visit (HOSPITAL_COMMUNITY): Admitting: Occupational Therapy

## 2024-03-04 DIAGNOSIS — M25611 Stiffness of right shoulder, not elsewhere classified: Secondary | ICD-10-CM | POA: Diagnosis not present

## 2024-03-04 DIAGNOSIS — R29898 Other symptoms and signs involving the musculoskeletal system: Secondary | ICD-10-CM

## 2024-03-04 DIAGNOSIS — M25511 Pain in right shoulder: Secondary | ICD-10-CM | POA: Diagnosis not present

## 2024-03-04 NOTE — Therapy (Unsigned)
 " OUTPATIENT OCCUPATIONAL THERAPY ORTHO TREATMENT NOTE  Patient Name: Ronnie Walker MRN: [id number] DOB:Apr 10, 1950, 73 y.o., male Today's Date: 03/04/2024   END OF SESSION:    Past Medical History:  Diagnosis Date   Arthritis    CVA (cerebral vascular accident) (HCC)    08/2019   Glaucoma    Hyperlipidemia    Past Surgical History:  Procedure Laterality Date   BACK SURGERY     COLONOSCOPY WITH PROPOFOL  N/A 03/27/2020   Procedure: COLONOSCOPY WITH PROPOFOL ;  Surgeon: Cindie Carlin POUR, DO;  Location: [facility];  Service: Endoscopy;  Laterality: N/A;  12:45pm   HYDROCELE EXCISION Right 05/24/2020   Procedure: HYDROCELECTOMY ADULT;  Surgeon: Sherrilee Belvie CROME, MD;  Location: [facility];  Service: Urology;  Laterality: Right;   INGUINAL HERNIA REPAIR Right    POLYPECTOMY  03/27/2020   Procedure: POLYPECTOMY INTESTINAL;  Surgeon: Cindie Carlin POUR, DO;  Location: [facility];  Service: Endoscopy;;   REVERSE SHOULDER ARTHROPLASTY Right 01/14/2024   Procedure: ARTHROPLASTY, SHOULDER, TOTAL, REVERSE;  Surgeon: Ronnie Oneil LABOR, MD;  Location: [facility];  Service: Orthopedics;  Laterality: Right;   VASECTOMY     Patient Active Problem List   Diagnosis Date Noted   Rotator cuff arthropathy of right shoulder 01/14/2024   LRTI (lower respiratory tract infection) 03/29/2021   Preventative health care 02/14/2020   Encounter for screening colonoscopy 02/14/2020   Elevated blood pressure reading    Gastroesophageal reflux disease    Acute ischemic stroke (HCC) 08/23/2019   Hyperlipidemia 02/16/2013   Hyperglycemia 02/16/2013    PCP: Alphonsa Hamilton, MD REFERRING PROVIDER: Onesimo Oneil, MD  ONSET DATE: 01/14/24  REFERRING DIAG:  F24.878 (ICD-10-CM) - Complete tear of right rotator cuff, unspecified whether traumatic  Z96.611 (ICD-10-CM) - Status post reverse arthroplasty of right shoulder    THERAPY DIAG:  No diagnosis found.  Rationale for Evaluation and Treatment:  Rehabilitation  SUBJECTIVE:   SUBJECTIVE STATEMENT: Going out to the side really hurts Pt accompanied by: self  PERTINENT HISTORY: Pt s/p R Reverse total shoulder arthroplasty, No significant PMH  PRECAUTIONS: Shoulder  WEIGHT BEARING RESTRICTIONS: Yes >1lbs  PAIN:  Are you having pain? No  FALLS: Has patient fallen in last 6 months? No  PLOF: Independent  PATIENT GOALS: To get my arm over my head  NEXT MD VISIT: 04/07/24  OBJECTIVE:   HAND DOMINANCE: Right  ADLs: Overall ADLs: Pt unable to pull up his jeans or don a belt. He also reports only being able to use L hand for bathing. Pt unable to lift anything at this time for cooking, cleaning , and yard work.   FUNCTIONAL OUTCOME MEASURES: Upper Extremity Functional Scale (UEFS): 14/80   Extreme difficulty/unable (0), Quite a bit of difficulty (1), Moderate difficulty (2), Little difficulty (3), No difficulty (4) Survey date:  02/09/24  Any of your usual work, household or school activities 0  2. Your usual hobbies, recreational/sport activities 0   3. Lifting a bag of groceries to waist level 0   4. Lifting a bag of groceries above your head 0  5. Grooming your hair 0  6. Pushing up on your hands (I.e. from bathtub or chair) 0  7. Preparing food (I.e. peeling/cutting) 0  8. Driving  2  9. Vacuuming, sweeping, or raking 0  10. Dressing  2  11. Doing up buttons 2  12. Using tools/appliances 0  13. Opening doors 2  14. Cleaning  1  15. Tying or lacing shoes  0  16. Sleeping  2  17. Laundering clothes (I.e. washing, ironing, folding) 0  18. Opening a jar 3  19. Throwing a ball 0  20. Carrying a small suitcase with your affected limb.  0  Score total:  14/80     UPPER EXTREMITY ROM:       Assessed in supine, er/IR adducted  Passive ROM Right eval  Shoulder flexion 131  Shoulder abduction 124  Shoulder internal rotation 90  Shoulder external rotation 29  (Blank rows = not tested)    UPPER EXTREMITY  MMT:     Assessed in seated, er/IR adducted  MMT Right eval  Shoulder flexion   Shoulder abduction   Shoulder internal rotation   Shoulder external rotation   (Blank rows = not tested)  SENSATION: Mild tingling noted periodically  EDEMA: No swelling noted at this time.   OBSERVATIONS: moderate fascial restrictions along the biceps, deltoid, axillary region, and trapezius   TODAY'S TREATMENT:                                                                                                                              DATE:   03/04/24 -Manual Therapy: myofascial release and trigger point applied to biceps, trapezius, and scapular region in order to reduce fascial restrictions and pain, as well as improve ROM.  -P/ROM: supine -flexion, abduction, er/IR, x10 -AA/ROM: supine - flexion, abduction, protraction, horizontal abduction, er/IR, x15 -A/ROM: supine - flexion, abduction, protraction, horizontal abduction, er/IR, x12 -functional reaching: 10 cones from counter to 1st shelf to 2nd shelf, flexion up, abduction down -UBE: level 1, 2.5' forwards and backwards, 6.5+ kph  03/01/24 -Manual Therapy: myofascial release and trigger point applied to biceps, trapezius, and scapular region in order to reduce fascial restrictions and pain, as well as improve ROM.  -P/ROM: supine -flexion, abduction, er/IR, x10 -AA/ROM: supine - flexion, abduction, protraction, horizontal abduction, er/IR, x12 -A/ROM: supine - flexion, abduction, protraction, horizontal abduction, er/IR, x10 -Proximal Shoulder exercises: paddles, criss cross, circles both directions, x10 each  02/26/24 -Manual Therapy: myofascial release and trigger point applied to biceps, trapezius, and scapular region in order to reduce fascial restrictions and pain, as well as improve ROM.  -P/ROM: supine -flexion, abduction, er/IR, x10 -AA/ROM: supine - flexion, abduction, protraction, horizontal abduction, er/IR, x10  -Ball Rolls:  flexion, abduction, x10 -Wall washes: low level, flexion, abduction 1'   02/24/24 -Manual Therapy: myofascial release and trigger point applied to biceps, trapezius, and scapular region in order to reduce fascial restrictions and pain, as well as improve ROM.  -P/ROM: supine -flexion, abduction, er/IR, x10 -AA/ROM: supine - flexion, abduction, protraction, horizontal abduction, er/IR, x10 -Scapular ROM: elevation/depression, retraction, extension, x10 -prot/ret/elev/dep, low level, x60 -Pulleys: flexion, abduction, x10   PATIENT EDUCATION: Education details: A/ROM Person educated: Patient Education method: Programmer, Multimedia, Facilities Manager, and Handouts Education comprehension: verbalized understanding, returned demonstration, and needs further education  HOME EXERCISE PROGRAM: 11/25: Pendulums and Table Slides 12/10: AA/ROM  12/16: A/ROM  GOALS: Goals reviewed with patient? Yes   SHORT TERM GOALS: Target date: 03/10/24  Pt will be provided with and educated on HEP to improve mobility in RUE required for use during ADL completion.   Goal status: IN PROGRESS  2.  Pt will increase RUE P/ROM by 20 degrees to improve ability to use RUE during dressing tasks with minimal compensatory techniques.   Goal status: IN PROGRESS  3.  Pt will increase RUE strength to 3+/5 to improve ability to reach for items at waist to chest height during bathing and grooming tasks.   Goal status: IN PROGRESS  LONG TERM GOALS: Target date: 04/10/24  Pt will decrease pain in RUE to 3/10 or less to improve ability to sleep for 2+ consecutive hours without waking due to pain.   Goal status: IN PROGRESS  2.  Pt will decrease RUE fascial restrictions to min amounts or less to improve mobility required for functional reaching tasks.   Goal status: IN PROGRESS  3.  Pt will increase RUE A/ROM by 25 degrees to improve ability to use RUE when reaching overhead or behind back during dressing and bathing tasks.    Goal status: IN PROGRESS  4.  Pt will increase RUE strength to 5/5 or greater to improve ability to use RUE when lifting or carrying items during meal preparation/housework/yardwork tasks.   Goal status: IN PROGRESS  5.  Pt will return to highest level of function using RUE as dominant during functional task completion.   Goal status: IN PROGRESS   ASSESSMENT:  CLINICAL IMPRESSION: Pt continues to demonstrate improving ROM and strength. He reports pain with abduction, however continues to be able to achieve full range in assisted and active ROM. OT had pt start A/ROM this session with good tolerance and 75% of full ROM. Verbal and tactile cuing provided for positioning and technique throughout session.   PERFORMANCE DEFICITS: in functional skills including in functional skills including ADLs, IADLs, coordination, tone, ROM, strength, pain, fascial restrictions, muscle spasms, and UE functional use.   PLAN:  OT FREQUENCY: 2x/week  OT DURATION: 8 weeks  PLANNED INTERVENTIONS: 97168 OT Re-evaluation, 97535 self care/ADL training, 02889 therapeutic exercise, 97530 therapeutic activity, 97112 neuromuscular re-education, 97140 manual therapy, 97035 ultrasound, 97010 moist heat, 97032 electrical stimulation (manual), passive range of motion, functional mobility training, energy conservation, coping strategies training, patient/family education, and DME and/or AE instructions  RECOMMENDED OTHER SERVICES: N/A  CONSULTED AND AGREED WITH PLAN OF CARE: Patient  PLAN FOR NEXT SESSION: Manual Therapy, P/ROM, Wall washes, Thumb tacs   [facility] [phone] [place], OT 03/04/2024, 9:26 AM    Managed Medicaid Authorization Request Treatment Start Date: 2024-02-12  Visit Dx Codes: M25.511, M25.611, R29.898  Functional Tool Score: UEFS: 14/80  For all possible CPT codes, reference the Planned Interventions line above.     Check all conditions that are  expected to impact treatment: {Conditions expected to impact treatment:None of these apply   If treatment provided at initial evaluation, no treatment charged due to lack of authorization.      "

## 2024-03-08 ENCOUNTER — Encounter (HOSPITAL_COMMUNITY): Payer: Self-pay | Admitting: Occupational Therapy

## 2024-03-08 ENCOUNTER — Ambulatory Visit (HOSPITAL_COMMUNITY): Admitting: Occupational Therapy

## 2024-03-08 DIAGNOSIS — M25611 Stiffness of right shoulder, not elsewhere classified: Secondary | ICD-10-CM | POA: Diagnosis not present

## 2024-03-08 DIAGNOSIS — E785 Hyperlipidemia, unspecified: Secondary | ICD-10-CM | POA: Diagnosis not present

## 2024-03-08 DIAGNOSIS — R29898 Other symptoms and signs involving the musculoskeletal system: Secondary | ICD-10-CM

## 2024-03-08 DIAGNOSIS — M25511 Pain in right shoulder: Secondary | ICD-10-CM | POA: Diagnosis not present

## 2024-03-08 DIAGNOSIS — E1169 Type 2 diabetes mellitus with other specified complication: Secondary | ICD-10-CM | POA: Diagnosis not present

## 2024-03-08 DIAGNOSIS — E119 Type 2 diabetes mellitus without complications: Secondary | ICD-10-CM | POA: Diagnosis not present

## 2024-03-08 DIAGNOSIS — Z125 Encounter for screening for malignant neoplasm of prostate: Secondary | ICD-10-CM | POA: Diagnosis not present

## 2024-03-08 NOTE — Therapy (Signed)
 " OUTPATIENT OCCUPATIONAL THERAPY ORTHO TREATMENT NOTE  Patient Name: Ronnie Walker MRN: [id number] DOB:August 24, 1950, 73 y.o., male Today's Date: 03/08/2024   END OF SESSION:  OT End of Session - 03/08/24 1041     Visit Number 8    Number of Visits 16    Date for Recertification  04/15/24    Authorization Type BCBS    Authorization Time Period no Auth required    OT Start Time 0959    OT Stop Time 1041    OT Time Calculation (min) 42 min    Activity Tolerance Patient tolerated treatment well    Behavior During Therapy [facility] for tasks assessed/performed          Past Medical History:  Diagnosis Date   Arthritis    CVA (cerebral vascular accident) (HCC)    08/2019   Glaucoma    Hyperlipidemia    Past Surgical History:  Procedure Laterality Date   BACK SURGERY     COLONOSCOPY WITH PROPOFOL  N/A 03/27/2020   Procedure: COLONOSCOPY WITH PROPOFOL ;  Surgeon: Cindie Carlin POUR, DO;  [facility];  Service: Endoscopy;  Laterality: N/A;  12:45pm   HYDROCELE EXCISION Right 05/24/2020   Procedure: HYDROCELECTOMY ADULT;  Surgeon: Sherrilee Belvie CROME, MD;  Location: [facility];  Service: Urology;  Laterality: Right;   INGUINAL HERNIA REPAIR Right    POLYPECTOMY  03/27/2020   Procedure: POLYPECTOMY INTESTINAL;  Surgeon: Cindie Carlin POUR, DO;  [facility];  Service: Endoscopy;;   REVERSE SHOULDER ARTHROPLASTY Right 01/14/2024   Procedure: ARTHROPLASTY, SHOULDER, TOTAL, REVERSE;  Surgeon: Onesimo Oneil LABOR, MD;  Location: [facility];  Service: Orthopedics;  Laterality: Right;   VASECTOMY     Patient Active Problem List   Diagnosis Date Noted   Rotator cuff arthropathy of right shoulder 01/14/2024   LRTI (lower respiratory tract infection) 03/29/2021   Preventative health care 02/14/2020   Encounter for screening colonoscopy 02/14/2020   Elevated blood pressure reading    Gastroesophageal reflux disease    Acute ischemic stroke (HCC) 08/23/2019   Hyperlipidemia 02/16/2013    Hyperglycemia 02/16/2013    PCP: Alphonsa Hamilton, MD REFERRING PROVIDER: Onesimo Oneil, MD  ONSET DATE: 01/14/24  REFERRING DIAG:  F24.878 (ICD-10-CM) - Complete tear of right rotator cuff, unspecified whether traumatic  Z96.611 (ICD-10-CM) - Status post reverse arthroplasty of right shoulder    THERAPY DIAG:  Acute pain of right shoulder  Shoulder stiffness, right  Other symptoms and signs involving the musculoskeletal system  Rationale for Evaluation and Treatment: Rehabilitation  SUBJECTIVE:   SUBJECTIVE STATEMENT: Going out to the side really hurts Pt accompanied by: self  PERTINENT HISTORY: Pt s/p R Reverse total shoulder arthroplasty, No significant PMH  PRECAUTIONS: Shoulder  WEIGHT BEARING RESTRICTIONS: Yes >1lbs  PAIN:  Are you having pain? No  FALLS: Has patient fallen in last 6 months? No  PLOF: Independent  PATIENT GOALS: To get my arm over my head  NEXT MD VISIT: 04/07/24  OBJECTIVE:   HAND DOMINANCE: Right  ADLs: Overall ADLs: Pt unable to pull up his jeans or don a belt. He also reports only being able to use L hand for bathing. Pt unable to lift anything at this time for cooking, cleaning , and yard work.   FUNCTIONAL OUTCOME MEASURES: Upper Extremity Functional Scale (UEFS): 14/80   Extreme difficulty/unable (0), Quite a bit of difficulty (1), Moderate difficulty (2), Little difficulty (3), No difficulty (4) Survey date:  02/09/24  Any of your usual  work, household or school activities 0  2. Your usual hobbies, recreational/sport activities 0   3. Lifting a bag of groceries to waist level 0   4. Lifting a bag of groceries above your head 0  5. Grooming your hair 0  6. Pushing up on your hands (I.e. from bathtub or chair) 0  7. Preparing food (I.e. peeling/cutting) 0  8. Driving  2  9. Vacuuming, sweeping, or raking 0  10. Dressing  2  11. Doing up buttons 2  12. Using tools/appliances 0  13. Opening doors 2  14. Cleaning  1  15.  Tying or lacing shoes 0  16. Sleeping  2  17. Laundering clothes (I.e. washing, ironing, folding) 0  18. Opening a jar 3  19. Throwing a ball 0  20. Carrying a small suitcase with your affected limb.  0  Score total:  14/80     UPPER EXTREMITY ROM:       Assessed in supine, er/IR adducted  Passive ROM Right eval  Shoulder flexion 131  Shoulder abduction 124  Shoulder internal rotation 90  Shoulder external rotation 29  (Blank rows = not tested)    UPPER EXTREMITY MMT:     Assessed in seated, er/IR adducted  MMT Right eval  Shoulder flexion   Shoulder abduction   Shoulder internal rotation   Shoulder external rotation   (Blank rows = not tested)  SENSATION: Mild tingling noted periodically  EDEMA: No swelling noted at this time.   OBSERVATIONS: moderate fascial restrictions along the biceps, deltoid, axillary region, and trapezius   TODAY'S TREATMENT:                                                                                                                              DATE:   03/08/24 -Manual Therapy: myofascial release and trigger point applied to biceps, trapezius, and scapular region in order to reduce fascial restrictions and pain, as well as improve ROM.  -P/ROM: supine -flexion, abduction, er/IR, x10 -AA/ROM: supine - flexion, abduction, protraction, horizontal abduction, er/IR, x10 -A/ROM: supine - flexion, abduction, protraction, horizontal abduction, er/IR, x10 -proximal shoulder exercises: paddles, criss cross, circles both directions, x10 each -ABC's in the air -A/ROM: seated - flexion, abduction, protraction, horizontal abduction, er/IR, x10 -UBE: level 1, 2.5' forwards and backwards, 6.5+ kph  03/04/24 -Manual Therapy: myofascial release and trigger point applied to biceps, trapezius, and scapular region in order to reduce fascial restrictions and pain, as well as improve ROM.  -P/ROM: supine -flexion, abduction, er/IR, x10 -AA/ROM: supine  - flexion, abduction, protraction, horizontal abduction, er/IR, x15 -A/ROM: supine - flexion, abduction, protraction, horizontal abduction, er/IR, x12 -functional reaching: 10 cones from counter to 1st shelf to 2nd shelf, flexion up, abduction down -UBE: level 1, 2.5' forwards and backwards, 6.5+ kph  03/01/24 -Manual Therapy: myofascial release and trigger point applied to biceps, trapezius, and scapular region in order to reduce fascial restrictions and pain, as well  as improve ROM.  -P/ROM: supine -flexion, abduction, er/IR, x10 -AA/ROM: supine - flexion, abduction, protraction, horizontal abduction, er/IR, x12 -A/ROM: supine - flexion, abduction, protraction, horizontal abduction, er/IR, x10 -Proximal Shoulder exercises: paddles, criss cross, circles both directions, x10 each   PATIENT EDUCATION: Education details: A/ROM Person educated: Patient Education method: Programmer, Multimedia, Facilities Manager, and Handouts Education comprehension: verbalized understanding, returned demonstration, and needs further education  HOME EXERCISE PROGRAM: Feb 18, 2024: Pendulums and Table Slides 12/10: AA/ROM 12/16: A/ROM  GOALS: Goals reviewed with patient? Yes   SHORT TERM GOALS: Target date: 03/10/24  Pt will be provided with and educated on HEP to improve mobility in RUE required for use during ADL completion.   Goal status: IN PROGRESS  2.  Pt will increase RUE P/ROM by 20 degrees to improve ability to use RUE during dressing tasks with minimal compensatory techniques.   Goal status: IN PROGRESS  3.  Pt will increase RUE strength to 3+/5 to improve ability to reach for items at waist to chest height during bathing and grooming tasks.   Goal status: IN PROGRESS  LONG TERM GOALS: Target date: 04/10/24  Pt will decrease pain in RUE to 3/10 or less to improve ability to sleep for 2+ consecutive hours without waking due to pain.   Goal status: IN PROGRESS  2.  Pt will decrease RUE fascial  restrictions to min amounts or less to improve mobility required for functional reaching tasks.   Goal status: IN PROGRESS  3.  Pt will increase RUE A/ROM by 25 degrees to improve ability to use RUE when reaching overhead or behind back during dressing and bathing tasks.   Goal status: IN PROGRESS  4.  Pt will increase RUE strength to 5/5 or greater to improve ability to use RUE when lifting or carrying items during meal preparation/housework/yardwork tasks.   Goal status: IN PROGRESS  5.  Pt will return to highest level of function using RUE as dominant during functional task completion.   Goal status: IN PROGRESS   ASSESSMENT:  CLINICAL IMPRESSION: Pt having increased difficulty with abduction this session. He is reporting popping and discomfort at all levels of abduction. OT providing education to take a break the next 2 days and rest the shoulder, then starting back with gentle stretching exercises. ROM continues to be 85% in supine actively and 75% of full ROM in sitting.   PERFORMANCE DEFICITS: in functional skills including in functional skills including ADLs, IADLs, coordination, tone, ROM, strength, pain, fascial restrictions, muscle spasms, and UE functional use.   PLAN:  OT FREQUENCY: 2x/week  OT DURATION: 8 weeks  PLANNED INTERVENTIONS: 97168 OT Re-evaluation, 97535 self care/ADL training, 02889 therapeutic exercise, 97530 therapeutic activity, 97112 neuromuscular re-education, 97140 manual therapy, 97035 ultrasound, 97010 moist heat, 97032 electrical stimulation (manual), passive range of motion, functional mobility training, energy conservation, coping strategies training, patient/family education, and DME and/or AE instructions  RECOMMENDED OTHER SERVICES: N/A  CONSULTED AND AGREED WITH PLAN OF CARE: Patient  PLAN FOR NEXT SESSION: Manual Therapy, P/ROM, Wall washes, Thumb tacs   Valentin Nightingale, OTR/L [facility] [phone],  OT 03/08/2024, 10:42 AM    Managed Medicaid Authorization Request Treatment Start Date: 18-Feb-2024  Visit Dx Codes: M25.511, M25.611, R29.898  Functional Tool Score: UEFS: 14/80  For all possible CPT codes, reference the Planned Interventions line above.     Check all conditions that are expected to impact treatment: {Conditions expected to impact treatment:None of these apply   If treatment  provided at initial evaluation, no treatment charged due to lack of authorization.      "

## 2024-03-10 LAB — LIPID PANEL
Chol/HDL Ratio: 3.3 ratio (ref 0.0–5.0)
Cholesterol, Total: 180 mg/dL (ref 100–199)
HDL: 55 mg/dL
LDL Chol Calc (NIH): 97 mg/dL (ref 0–99)
Triglycerides: 161 mg/dL — ABNORMAL HIGH (ref 0–149)
VLDL Cholesterol Cal: 28 mg/dL (ref 5–40)

## 2024-03-10 LAB — MICROALBUMIN / CREATININE URINE RATIO
Creatinine, Urine: 62 mg/dL
Microalb/Creat Ratio: 8 mg/g{creat} (ref 0–29)
Microalbumin, Urine: 4.8 ug/mL

## 2024-03-10 LAB — HEMOGLOBIN A1C
Est. average glucose Bld gHb Est-mCnc: 143 mg/dL
Hgb A1c MFr Bld: 6.6 % — ABNORMAL HIGH (ref 4.8–5.6)

## 2024-03-10 LAB — PSA: Prostate Specific Ag, Serum: 0.7 ng/mL (ref 0.0–4.0)

## 2024-03-12 ENCOUNTER — Ambulatory Visit: Payer: Self-pay | Admitting: Family Medicine

## 2024-03-15 ENCOUNTER — Ambulatory Visit (HOSPITAL_COMMUNITY): Admitting: Occupational Therapy

## 2024-03-15 ENCOUNTER — Encounter (HOSPITAL_COMMUNITY): Payer: Self-pay | Admitting: Occupational Therapy

## 2024-03-15 DIAGNOSIS — M25511 Pain in right shoulder: Secondary | ICD-10-CM | POA: Diagnosis not present

## 2024-03-15 DIAGNOSIS — R29898 Other symptoms and signs involving the musculoskeletal system: Secondary | ICD-10-CM | POA: Diagnosis not present

## 2024-03-15 DIAGNOSIS — M25611 Stiffness of right shoulder, not elsewhere classified: Secondary | ICD-10-CM

## 2024-03-15 NOTE — Patient Instructions (Signed)

## 2024-03-15 NOTE — Therapy (Signed)
 " OUTPATIENT OCCUPATIONAL THERAPY ORTHO TREATMENT NOTE  Patient Name: Ronnie Walker MRN: [id number] DOB:03-Jul-1950, 73 y.o., male Today's Date: 03/15/2024   END OF SESSION:  OT End of Session - 03/15/24 0944     Visit Number 9    Number of Visits 16    Date for Recertification  04/15/24    Authorization Type BCBS    Authorization Time Period no Auth required    OT Start Time 0903    OT Stop Time 0944    OT Time Calculation (min) 41 min    Activity Tolerance Patient tolerated treatment well    Behavior During Therapy [facility] for tasks assessed/performed           Past Medical History:  Diagnosis Date   Arthritis    CVA (cerebral vascular accident) (HCC)    08/2019   Glaucoma    Hyperlipidemia    Past Surgical History:  Procedure Laterality Date   BACK SURGERY     COLONOSCOPY WITH PROPOFOL  N/A 03/27/2020   Procedure: COLONOSCOPY WITH PROPOFOL ;  Surgeon: Cindie Carlin POUR, DO;  [facility];  Service: Endoscopy;  Laterality: N/A;  12:45pm   HYDROCELE EXCISION Right 05/24/2020   Procedure: HYDROCELECTOMY ADULT;  Surgeon: Sherrilee Belvie CROME, MD;  Location: [facility];  Service: Urology;  Laterality: Right;   INGUINAL HERNIA REPAIR Right    POLYPECTOMY  03/27/2020   Procedure: POLYPECTOMY INTESTINAL;  Surgeon: Cindie Carlin POUR, DO;  [facility];  Service: Endoscopy;;   REVERSE SHOULDER ARTHROPLASTY Right 01/14/2024   Procedure: ARTHROPLASTY, SHOULDER, TOTAL, REVERSE;  Surgeon: Onesimo Oneil LABOR, MD;  Location: [facility];  Service: Orthopedics;  Laterality: Right;   VASECTOMY     Patient Active Problem List   Diagnosis Date Noted   Rotator cuff arthropathy of right shoulder 01/14/2024   LRTI (lower respiratory tract infection) 03/29/2021   Preventative health care 02/14/2020   Encounter for screening colonoscopy 02/14/2020   Elevated blood pressure reading    Gastroesophageal reflux disease    Acute ischemic stroke (HCC) 08/23/2019   Hyperlipidemia 02/16/2013    Hyperglycemia 02/16/2013    PCP: Alphonsa Hamilton, MD REFERRING PROVIDER: Onesimo Oneil, MD  ONSET DATE: 01/14/24  REFERRING DIAG:  F24.878 (ICD-10-CM) - Complete tear of right rotator cuff, unspecified whether traumatic  Z96.611 (ICD-10-CM) - Status post reverse arthroplasty of right shoulder    THERAPY DIAG:  Acute pain of right shoulder  Shoulder stiffness, right  Other symptoms and signs involving the musculoskeletal system  Rationale for Evaluation and Treatment: Rehabilitation  SUBJECTIVE:   SUBJECTIVE STATEMENT: Going out to the side really hurts Pt accompanied by: self  PERTINENT HISTORY: Pt s/p R Reverse total shoulder arthroplasty, No significant PMH  PRECAUTIONS: Shoulder  WEIGHT BEARING RESTRICTIONS: Yes >1lbs  PAIN:  Are you having pain? No  FALLS: Has patient fallen in last 6 months? No  PLOF: Independent  PATIENT GOALS: To get my arm over my head  NEXT MD VISIT: 04/07/24  OBJECTIVE:   HAND DOMINANCE: Right  ADLs: Overall ADLs: Pt unable to pull up his jeans or don a belt. He also reports only being able to use L hand for bathing. Pt unable to lift anything at this time for cooking, cleaning , and yard work.   FUNCTIONAL OUTCOME MEASURES: Upper Extremity Functional Scale (UEFS): 14/80   Extreme difficulty/unable (0), Quite a bit of difficulty (1), Moderate difficulty (2), Little difficulty (3), No difficulty (4) Survey date:  02/09/24  Any of your  usual work, household or school activities 0  2. Your usual hobbies, recreational/sport activities 0   3. Lifting a bag of groceries to waist level 0   4. Lifting a bag of groceries above your head 0  5. Grooming your hair 0  6. Pushing up on your hands (I.e. from bathtub or chair) 0  7. Preparing food (I.e. peeling/cutting) 0  8. Driving  2  9. Vacuuming, sweeping, or raking 0  10. Dressing  2  11. Doing up buttons 2  12. Using tools/appliances 0  13. Opening doors 2  14. Cleaning  1   15. Tying or lacing shoes 0  16. Sleeping  2  17. Laundering clothes (I.e. washing, ironing, folding) 0  18. Opening a jar 3  19. Throwing a ball 0  20. Carrying a small suitcase with your affected limb.  0  Score total:  14/80     UPPER EXTREMITY ROM:       Assessed in supine, er/IR adducted  Passive ROM Right eval  Shoulder flexion 131  Shoulder abduction 124  Shoulder internal rotation 90  Shoulder external rotation 29  (Blank rows = not tested)    UPPER EXTREMITY MMT:     Assessed in seated, er/IR adducted  MMT Right eval  Shoulder flexion   Shoulder abduction   Shoulder internal rotation   Shoulder external rotation   (Blank rows = not tested)  SENSATION: Mild tingling noted periodically  EDEMA: No swelling noted at this time.   OBSERVATIONS: moderate fascial restrictions along the biceps, deltoid, axillary region, and trapezius   TODAY'S TREATMENT:                                                                                                                              DATE:   03/15/24 -Manual Therapy: myofascial release and trigger point applied to biceps, trapezius, and scapular region in order to reduce fascial restrictions and pain, as well as improve ROM.  -P/ROM: supine -flexion, abduction, er/IR, x10 -A/ROM: supine - flexion, abduction, protraction, horizontal abduction, er/IR, x15 -X to V arms, x10 -Scapular Strengthening: red, extension, retraction, rows, x10 -ABC on the wall  03/08/24 -Manual Therapy: myofascial release and trigger point applied to biceps, trapezius, and scapular region in order to reduce fascial restrictions and pain, as well as improve ROM.  -P/ROM: supine -flexion, abduction, er/IR, x10 -AA/ROM: supine - flexion, abduction, protraction, horizontal abduction, er/IR, x10 -A/ROM: supine - flexion, abduction, protraction, horizontal abduction, er/IR, x10 -proximal shoulder exercises: paddles, criss cross, circles both  directions, x10 each -ABC's in the air -A/ROM: seated - flexion, abduction, protraction, horizontal abduction, er/IR, x10 -UBE: level 1, 2.5' forwards and backwards, 6.5+ kph  03/04/24 -Manual Therapy: myofascial release and trigger point applied to biceps, trapezius, and scapular region in order to reduce fascial restrictions and pain, as well as improve ROM.  -P/ROM: supine -flexion, abduction, er/IR, x10 -AA/ROM: supine - flexion, abduction, protraction, horizontal abduction,  er/IR, x15 -A/ROM: supine - flexion, abduction, protraction, horizontal abduction, er/IR, x12 -functional reaching: 10 cones from counter to 1st shelf to 2nd shelf, flexion up, abduction down -UBE: level 1, 2.5' forwards and backwards, 6.5+ kph  03/01/24 -Manual Therapy: myofascial release and trigger point applied to biceps, trapezius, and scapular region in order to reduce fascial restrictions and pain, as well as improve ROM.  -P/ROM: supine -flexion, abduction, er/IR, x10 -AA/ROM: supine - flexion, abduction, protraction, horizontal abduction, er/IR, x12 -A/ROM: supine - flexion, abduction, protraction, horizontal abduction, er/IR, x10 -Proximal Shoulder exercises: paddles, criss cross, circles both directions, x10 each   PATIENT EDUCATION: Education details: A/ROM Person educated: Patient Education method: Programmer, Multimedia, Facilities Manager, and Handouts Education comprehension: verbalized understanding, returned demonstration, and needs further education  HOME EXERCISE PROGRAM: 11/25: Pendulums and Table Slides 12/10: AA/ROM 12/16: A/ROM  GOALS: Goals reviewed with patient? Yes   SHORT TERM GOALS: Target date: 03/10/24  Pt will be provided with and educated on HEP to improve mobility in RUE required for use during ADL completion.   Goal status: IN PROGRESS  2.  Pt will increase RUE P/ROM by 20 degrees to improve ability to use RUE during dressing tasks with minimal compensatory techniques.   Goal  status: IN PROGRESS  3.  Pt will increase RUE strength to 3+/5 to improve ability to reach for items at waist to chest height during bathing and grooming tasks.   Goal status: IN PROGRESS  LONG TERM GOALS: Target date: 04/10/24  Pt will decrease pain in RUE to 3/10 or less to improve ability to sleep for 2+ consecutive hours without waking due to pain.   Goal status: IN PROGRESS  2.  Pt will decrease RUE fascial restrictions to min amounts or less to improve mobility required for functional reaching tasks.   Goal status: IN PROGRESS  3.  Pt will increase RUE A/ROM by 25 degrees to improve ability to use RUE when reaching overhead or behind back during dressing and bathing tasks.   Goal status: IN PROGRESS  4.  Pt will increase RUE strength to 5/5 or greater to improve ability to use RUE when lifting or carrying items during meal preparation/housework/yardwork tasks.   Goal status: IN PROGRESS  5.  Pt will return to highest level of function using RUE as dominant during functional task completion.   Goal status: IN PROGRESS   ASSESSMENT:  CLINICAL IMPRESSION: This session pt continues to report mild pain/muscle pulling when completing A/ROM, however his ROM is near 80% of full range. With abduction he is reaching near full range, however he is having popping on the posterior aspect of his shoulder, as well as on the superior aspect of his shoulder. Pt started scapular strengthening and ABC's on the wall for shoulder stability and demonstrated good control and form with both. OT providing verbal and tactile cuing for positioning and technique throughout session.   PERFORMANCE DEFICITS: in functional skills including in functional skills including ADLs, IADLs, coordination, tone, ROM, strength, pain, fascial restrictions, muscle spasms, and UE functional use.   PLAN:  OT FREQUENCY: 2x/week  OT DURATION: 8 weeks  PLANNED INTERVENTIONS: 97168 OT Re-evaluation, 97535 self care/ADL  training, 02889 therapeutic exercise, 97530 therapeutic activity, 97112 neuromuscular re-education, 97140 manual therapy, 97035 ultrasound, 97010 moist heat, 97032 electrical stimulation (manual), passive range of motion, functional mobility training, energy conservation, coping strategies training, patient/family education, and DME and/or AE instructions  RECOMMENDED OTHER SERVICES: N/A  CONSULTED AND AGREED WITH PLAN OF CARE: Patient  PLAN FOR NEXT SESSION: Manual Therapy, P/ROM, Wall washes, Thumb tacs   Valentin Thelbert LEYLAND [facility] [phone] [place], OT 03/15/2024, 9:45 AM    Managed Medicaid Authorization Request Treatment Start Date: 07-Mar-2024  Visit Dx Codes: M25.511, M25.611, R29.898  Functional Tool Score: UEFS: 14/80  For all possible CPT codes, reference the Planned Interventions line above.     Check all conditions that are expected to impact treatment: {Conditions expected to impact treatment:None of these apply   If treatment provided at initial evaluation, no treatment charged due to lack of authorization.      "

## 2024-03-22 ENCOUNTER — Ambulatory Visit (HOSPITAL_COMMUNITY): Attending: Orthopedic Surgery | Admitting: Occupational Therapy

## 2024-03-22 ENCOUNTER — Encounter (HOSPITAL_COMMUNITY): Payer: Self-pay | Admitting: Occupational Therapy

## 2024-03-22 DIAGNOSIS — R29898 Other symptoms and signs involving the musculoskeletal system: Secondary | ICD-10-CM | POA: Diagnosis present

## 2024-03-22 DIAGNOSIS — M25511 Pain in right shoulder: Secondary | ICD-10-CM | POA: Insufficient documentation

## 2024-03-22 DIAGNOSIS — M25611 Stiffness of right shoulder, not elsewhere classified: Secondary | ICD-10-CM | POA: Insufficient documentation

## 2024-03-22 NOTE — Therapy (Signed)
 " OUTPATIENT OCCUPATIONAL THERAPY ORTHO TREATMENT NOTE  Patient Name: Ronnie Walker MRN: [id number] DOB:04-16-1950, 74 y.o., male Today's Date: 03/22/2024   END OF SESSION:  OT End of Session - 03/22/24 1248     Visit Number 10    Number of Visits 16    Date for Recertification  04/15/24    Authorization Type BCBS    Authorization Time Period no Auth required    OT Start Time 0906    OT Stop Time 0946    OT Time Calculation (min) 40 min    Activity Tolerance Patient tolerated treatment well    Behavior During Therapy [facility] for tasks assessed/performed          Past Medical History:  Diagnosis Date   Arthritis    CVA (cerebral vascular accident) (HCC)    08/2019   Glaucoma    Hyperlipidemia    Past Surgical History:  Procedure Laterality Date   BACK SURGERY     COLONOSCOPY WITH PROPOFOL  N/A 03/27/2020   Procedure: COLONOSCOPY WITH PROPOFOL ;  Surgeon: Cindie Carlin POUR, DO;  [facility];  Service: Endoscopy;  Laterality: N/A;  12:45pm   HYDROCELE EXCISION Right 05/24/2020   Procedure: HYDROCELECTOMY ADULT;  Surgeon: Sherrilee Belvie CROME, MD;  Location: [facility];  Service: Urology;  Laterality: Right;   INGUINAL HERNIA REPAIR Right    POLYPECTOMY  03/27/2020   Procedure: POLYPECTOMY INTESTINAL;  Surgeon: Cindie Carlin POUR, DO;  Location: [facility];  Service: Endoscopy;;   REVERSE SHOULDER ARTHROPLASTY Right 01/14/2024   Procedure: ARTHROPLASTY, SHOULDER, TOTAL, REVERSE;  Surgeon: Onesimo Oneil LABOR, MD;  Location: [facility];  Service: Orthopedics;  Laterality: Right;   VASECTOMY     Patient Active Problem List   Diagnosis Date Noted   Rotator cuff arthropathy of right shoulder 01/14/2024   LRTI (lower respiratory tract infection) 03/29/2021   Preventative health care 02/14/2020   Encounter for screening colonoscopy 02/14/2020   Elevated blood pressure reading    Gastroesophageal reflux disease    Acute ischemic stroke (HCC) 08/23/2019   Hyperlipidemia 02/16/2013    Hyperglycemia 02/16/2013    PCP: Alphonsa Hamilton, MD REFERRING PROVIDER: Onesimo Oneil, MD  ONSET DATE: 01/14/24  REFERRING DIAG:  F24.878 (ICD-10-CM) - Complete tear of right rotator cuff, unspecified whether traumatic  Z96.611 (ICD-10-CM) - Status post reverse arthroplasty of right shoulder    THERAPY DIAG:  Acute pain of right shoulder  Shoulder stiffness, right  Other symptoms and signs involving the musculoskeletal system  Rationale for Evaluation and Treatment: Rehabilitation  SUBJECTIVE:   SUBJECTIVE STATEMENT: It still bothers me some Pt accompanied by: self  PERTINENT HISTORY: Pt s/p R Reverse total shoulder arthroplasty, No significant PMH  PRECAUTIONS: Shoulder  WEIGHT BEARING RESTRICTIONS: Yes >1lbs  PAIN:  Are you having pain? No  FALLS: Has patient fallen in last 6 months? No  PLOF: Independent  PATIENT GOALS: To get my arm over my head  NEXT MD VISIT: 04/07/24  OBJECTIVE:   HAND DOMINANCE: Right  ADLs: Overall ADLs: Pt unable to pull up his jeans or don a belt. He also reports only being able to use L hand for bathing. Pt unable to lift anything at this time for cooking, cleaning , and yard work.   FUNCTIONAL OUTCOME MEASURES: Upper Extremity Functional Scale (UEFS): 14/80   Extreme difficulty/unable (0), Quite a bit of difficulty (1), Moderate difficulty (2), Little difficulty (3), No difficulty (4) Survey date:  02/09/24  Any of your usual work, household  or school activities 0  2. Your usual hobbies, recreational/sport activities 0   3. Lifting a bag of groceries to waist level 0   4. Lifting a bag of groceries above your head 0  5. Grooming your hair 0  6. Pushing up on your hands (I.e. from bathtub or chair) 0  7. Preparing food (I.e. peeling/cutting) 0  8. Driving  2  9. Vacuuming, sweeping, or raking 0  10. Dressing  2  11. Doing up buttons 2  12. Using tools/appliances 0  13. Opening doors 2  14. Cleaning  1  15. Tying or  lacing shoes 0  16. Sleeping  2  17. Laundering clothes (I.e. washing, ironing, folding) 0  18. Opening a jar 3  19. Throwing a ball 0  20. Carrying a small suitcase with your affected limb.  0  Score total:  14/80     UPPER EXTREMITY ROM:       Assessed in supine, er/IR adducted  Passive ROM Right eval  Shoulder flexion 131  Shoulder abduction 124  Shoulder internal rotation 90  Shoulder external rotation 29  (Blank rows = not tested)    UPPER EXTREMITY MMT:     Assessed in seated, er/IR adducted  MMT Right eval  Shoulder flexion   Shoulder abduction   Shoulder internal rotation   Shoulder external rotation   (Blank rows = not tested)  SENSATION: Mild tingling noted periodically  EDEMA: No swelling noted at this time.   OBSERVATIONS: moderate fascial restrictions along the biceps, deltoid, axillary region, and trapezius   TODAY'S TREATMENT:                                                                                                                              DATE:   03/22/24 -Manual Therapy: myofascial release and trigger point applied to biceps, trapezius, and scapular region in order to reduce fascial restrictions and pain, as well as improve ROM.  -P/ROM: supine -flexion, abduction, er/IR, x10 -A/ROM: supine - flexion, abduction, protraction, horizontal abduction, er/IR, x12 -Overhead lacing -Theraball Exercises: yellow weighted ball, flexion, protraction, overhead press, V ups, circles both directions, x10  03/15/24 -Manual Therapy: myofascial release and trigger point applied to biceps, trapezius, and scapular region in order to reduce fascial restrictions and pain, as well as improve ROM.  -P/ROM: supine -flexion, abduction, er/IR, x10 -A/ROM: supine - flexion, abduction, protraction, horizontal abduction, er/IR, x15 -X to V arms, x10 -Scapular Strengthening: red, extension, retraction, rows, x10 -ABC on the wall  03/08/24 -Manual Therapy:  myofascial release and trigger point applied to biceps, trapezius, and scapular region in order to reduce fascial restrictions and pain, as well as improve ROM.  -P/ROM: supine -flexion, abduction, er/IR, x10 -AA/ROM: supine - flexion, abduction, protraction, horizontal abduction, er/IR, x10 -A/ROM: supine - flexion, abduction, protraction, horizontal abduction, er/IR, x10 -proximal shoulder exercises: paddles, criss cross, circles both directions, x10 each -ABC's in the air -A/ROM: seated -  flexion, abduction, protraction, horizontal abduction, er/IR, x10 -UBE: level 1, 2.5' forwards and backwards, 6.5+ kph   PATIENT EDUCATION: Education details: Publishing Rights Manager Person educated: Patient Education method: Explanation, Demonstration, and Handouts Education comprehension: verbalized understanding, returned demonstration, and needs further education  HOME EXERCISE PROGRAM: March 04, 2025: Pendulums and Table Slides 12/10: AA/ROM 12/16: A/ROM 12/30: Scapular Strengthening  GOALS: Goals reviewed with patient? Yes   SHORT TERM GOALS: Target date: 03/10/24  Pt will be provided with and educated on HEP to improve mobility in RUE required for use during ADL completion.   Goal status: IN PROGRESS  2.  Pt will increase RUE P/ROM by 20 degrees to improve ability to use RUE during dressing tasks with minimal compensatory techniques.   Goal status: IN PROGRESS  3.  Pt will increase RUE strength to 3+/5 to improve ability to reach for items at waist to chest height during bathing and grooming tasks.   Goal status: IN PROGRESS  LONG TERM GOALS: Target date: 04/10/24  Pt will decrease pain in RUE to 3/10 or less to improve ability to sleep for 2+ consecutive hours without waking due to pain.   Goal status: IN PROGRESS  2.  Pt will decrease RUE fascial restrictions to min amounts or less to improve mobility required for functional reaching tasks.   Goal status: IN PROGRESS  3.  Pt will  increase RUE A/ROM by 25 degrees to improve ability to use RUE when reaching overhead or behind back during dressing and bathing tasks.   Goal status: IN PROGRESS  4.  Pt will increase RUE strength to 5/5 or greater to improve ability to use RUE when lifting or carrying items during meal preparation/housework/yardwork tasks.   Goal status: IN PROGRESS  5.  Pt will return to highest level of function using RUE as dominant during functional task completion.   Goal status: IN PROGRESS   ASSESSMENT:  CLINICAL IMPRESSION: Pt continues to make improvements with ROM and strengthening. He is demonstrating 80-85% of full ROM actively. Continued to focus on strengthening functionally this session, as well as stabilizing his shoulder with overhead lacing. Verbal and tactile cuing provided for positioning and technique throughout session.   PERFORMANCE DEFICITS: in functional skills including in functional skills including ADLs, IADLs, coordination, tone, ROM, strength, pain, fascial restrictions, muscle spasms, and UE functional use.   PLAN:  OT FREQUENCY: 2x/week  OT DURATION: 8 weeks  PLANNED INTERVENTIONS: 97168 OT Re-evaluation, 97535 self care/ADL training, 02889 therapeutic exercise, 97530 therapeutic activity, 97112 neuromuscular re-education, 97140 manual therapy, 97035 ultrasound, 97010 moist heat, 97032 electrical stimulation (manual), passive range of motion, functional mobility training, energy conservation, coping strategies training, patient/family education, and DME and/or AE instructions  RECOMMENDED OTHER SERVICES: N/A  CONSULTED AND AGREED WITH PLAN OF CARE: Patient  PLAN FOR NEXT SESSION: Manual Therapy, P/ROM, Wall washes, Thumb tacs   Valentin Thelbert LEYLAND [facility] [phone] Valentin Jillyn Thelbert, OT 03/22/2024, 12:49 PM    Managed Medicaid Authorization Request Treatment Start Date: 04-Mar-2024  Visit Dx Codes: M25.511, M25.611,  R29.898  Functional Tool Score: UEFS: 14/80  For all possible CPT codes, reference the Planned Interventions line above.     Check all conditions that are expected to impact treatment: {Conditions expected to impact treatment:None of these apply   If treatment provided at initial evaluation, no treatment charged due to lack of authorization.      "

## 2024-03-24 ENCOUNTER — Ambulatory Visit (HOSPITAL_COMMUNITY): Admitting: Occupational Therapy

## 2024-03-24 ENCOUNTER — Encounter (HOSPITAL_COMMUNITY): Payer: Self-pay | Admitting: Occupational Therapy

## 2024-03-24 DIAGNOSIS — R29898 Other symptoms and signs involving the musculoskeletal system: Secondary | ICD-10-CM

## 2024-03-24 DIAGNOSIS — M25611 Stiffness of right shoulder, not elsewhere classified: Secondary | ICD-10-CM

## 2024-03-24 DIAGNOSIS — M25511 Pain in right shoulder: Secondary | ICD-10-CM

## 2024-03-24 NOTE — Patient Instructions (Signed)

## 2024-03-24 NOTE — Therapy (Signed)
 " OUTPATIENT OCCUPATIONAL THERAPY ORTHO TREATMENT NOTE  Patient Name: Ronnie Walker MRN: [id number] DOB:Jul 19, 1950, 74 y.o., male Today's Date: 03/24/2024   END OF SESSION:  OT End of Session - 03/24/24 1036     Visit Number 11    Number of Visits 16    Date for Recertification  04/15/24    Authorization Type BCBS    Authorization Time Period no Auth required    OT Start Time [phone]    OT Stop Time 1032    OT Time Calculation (min) 40 min    Activity Tolerance Patient tolerated treatment well    Behavior During Therapy [facility] for tasks assessed/performed          Past Medical History:  Diagnosis Date   Arthritis    CVA (cerebral vascular accident) (HCC)    08/2019   Glaucoma    Hyperlipidemia    Past Surgical History:  Procedure Laterality Date   BACK SURGERY     COLONOSCOPY WITH PROPOFOL  N/A 03/27/2020   Procedure: COLONOSCOPY WITH PROPOFOL ;  Surgeon: Cindie Carlin POUR, DO;  [facility];  Service: Endoscopy;  Laterality: N/A;  12:45pm   HYDROCELE EXCISION Right 05/24/2020   Procedure: HYDROCELECTOMY ADULT;  Surgeon: Sherrilee Belvie CROME, MD;  Location: [facility];  Service: Urology;  Laterality: Right;   INGUINAL HERNIA REPAIR Right    POLYPECTOMY  03/27/2020   Procedure: POLYPECTOMY INTESTINAL;  Surgeon: Cindie Carlin POUR, DO;  [facility];  Service: Endoscopy;;   REVERSE SHOULDER ARTHROPLASTY Right 01/14/2024   Procedure: ARTHROPLASTY, SHOULDER, TOTAL, REVERSE;  Surgeon: Onesimo Oneil LABOR, MD;  Location: [facility];  Service: Orthopedics;  Laterality: Right;   VASECTOMY     Patient Active Problem List   Diagnosis Date Noted   Rotator cuff arthropathy of right shoulder 01/14/2024   LRTI (lower respiratory tract infection) 03/29/2021   Preventative health care 02/14/2020   Encounter for screening colonoscopy 02/14/2020   Elevated blood pressure reading    Gastroesophageal reflux disease    Acute ischemic stroke (HCC) 08/23/2019   Hyperlipidemia 02/16/2013    Hyperglycemia 02/16/2013    PCP: Alphonsa Hamilton, MD REFERRING PROVIDER: Onesimo Oneil, MD  ONSET DATE: 01/14/24  REFERRING DIAG:  F24.878 (ICD-10-CM) - Complete tear of right rotator cuff, unspecified whether traumatic  Z96.611 (ICD-10-CM) - Status post reverse arthroplasty of right shoulder    THERAPY DIAG:  Acute pain of right shoulder  Shoulder stiffness, right  Other symptoms and signs involving the musculoskeletal system  Rationale for Evaluation and Treatment: Rehabilitation  SUBJECTIVE:   SUBJECTIVE STATEMENT: It still bothers me some Pt accompanied by: self  PERTINENT HISTORY: Pt s/p R Reverse total shoulder arthroplasty, No significant PMH  PRECAUTIONS: Shoulder  WEIGHT BEARING RESTRICTIONS: Yes >1lbs  PAIN:  Are you having pain? No  FALLS: Has patient fallen in last 6 months? No  PLOF: Independent  PATIENT GOALS: To get my arm over my head  NEXT MD VISIT: 04/07/24  OBJECTIVE:   HAND DOMINANCE: Right  ADLs: Overall ADLs: Pt unable to pull up his jeans or don a belt. He also reports only being able to use L hand for bathing. Pt unable to lift anything at this time for cooking, cleaning , and yard work.   FUNCTIONAL OUTCOME MEASURES: Upper Extremity Functional Scale (UEFS): 14/80   Extreme difficulty/unable (0), Quite a bit of difficulty (1), Moderate difficulty (2), Little difficulty (3), No difficulty (4) Survey date:  02/09/24  Any of your usual work, household  or school activities 0  2. Your usual hobbies, recreational/sport activities 0   3. Lifting a bag of groceries to waist level 0   4. Lifting a bag of groceries above your head 0  5. Grooming your hair 0  6. Pushing up on your hands (I.e. from bathtub or chair) 0  7. Preparing food (I.e. peeling/cutting) 0  8. Driving  2  9. Vacuuming, sweeping, or raking 0  10. Dressing  2  11. Doing up buttons 2  12. Using tools/appliances 0  13. Opening doors 2  14. Cleaning  1  15. Tying or  lacing shoes 0  16. Sleeping  2  17. Laundering clothes (I.e. washing, ironing, folding) 0  18. Opening a jar 3  19. Throwing a ball 0  20. Carrying a small suitcase with your affected limb.  0  Score total:  14/80     UPPER EXTREMITY ROM:       Assessed in supine, er/IR adducted  Passive ROM Right eval  Shoulder flexion 131  Shoulder abduction 124  Shoulder internal rotation 90  Shoulder external rotation 29  (Blank rows = not tested)    UPPER EXTREMITY MMT:     Assessed in seated, er/IR adducted  MMT Right eval  Shoulder flexion   Shoulder abduction   Shoulder internal rotation   Shoulder external rotation   (Blank rows = not tested)  SENSATION: Mild tingling noted periodically  EDEMA: No swelling noted at this time.   OBSERVATIONS: moderate fascial restrictions along the biceps, deltoid, axillary region, and trapezius   TODAY'S TREATMENT:                                                                                                                              DATE:   03/24/24 -Manual Therapy: myofascial release and trigger point applied to biceps, trapezius, and scapular region in order to reduce fascial restrictions and pain, as well as improve ROM.  -A/ROM: supine - flexion, abduction, protraction, horizontal abduction, er/IR, x12 -PNF Strengthening: red band, chest pulls, overhead pulls, PNF up, PNF down, er pulls, x12 -A/ROM: seated - flexion, abduction, protraction, horizontal abduction, er/IR, x10 -X to V arms, x10 -Goal post arms, x10 -functional reaching: 2# wrist weight, 10 cones from counter to 1st shelf to 2nd shelf, down in abduction -UBE: level 2, 2.5' forwards and backwards  03/22/24 -Manual Therapy: myofascial release and trigger point applied to biceps, trapezius, and scapular region in order to reduce fascial restrictions and pain, as well as improve ROM.  -P/ROM: supine -flexion, abduction, er/IR, x10 -A/ROM: supine - flexion, abduction,  protraction, horizontal abduction, er/IR, x12 -Overhead lacing -Theraball Exercises: yellow weighted ball, flexion, protraction, overhead press, V ups, circles both directions, x10  03/15/24 -Manual Therapy: myofascial release and trigger point applied to biceps, trapezius, and scapular region in order to reduce fascial restrictions and pain, as well as improve ROM.  -P/ROM: supine -flexion, abduction, er/IR, x10 -A/ROM:  supine - flexion, abduction, protraction, horizontal abduction, er/IR, x15 -X to V arms, x10 -Scapular Strengthening: red, extension, retraction, rows, x10 -ABC on the wall   PATIENT EDUCATION: Education details: PNF Strengthening Person educated: Patient Education method: Explanation, Demonstration, and Handouts Education comprehension: verbalized understanding, returned demonstration, and needs further education  HOME EXERCISE PROGRAM: 02-17-2025: Pendulums and Table Slides 12/10: AA/ROM 12/16: A/ROM 12/30: Scapular Strengthening 1/8: PNF Strengthening  GOALS: Goals reviewed with patient? Yes   SHORT TERM GOALS: Target date: 03/10/24  Pt will be provided with and educated on HEP to improve mobility in RUE required for use during ADL completion.   Goal status: IN PROGRESS  2.  Pt will increase RUE P/ROM by 20 degrees to improve ability to use RUE during dressing tasks with minimal compensatory techniques.   Goal status: IN PROGRESS  3.  Pt will increase RUE strength to 3+/5 to improve ability to reach for items at waist to chest height during bathing and grooming tasks.   Goal status: IN PROGRESS  LONG TERM GOALS: Target date: 04/10/24  Pt will decrease pain in RUE to 3/10 or less to improve ability to sleep for 2+ consecutive hours without waking due to pain.   Goal status: IN PROGRESS  2.  Pt will decrease RUE fascial restrictions to min amounts or less to improve mobility required for functional reaching tasks.   Goal status: IN PROGRESS  3.  Pt  will increase RUE A/ROM by 25 degrees to improve ability to use RUE when reaching overhead or behind back during dressing and bathing tasks.   Goal status: IN PROGRESS  4.  Pt will increase RUE strength to 5/5 or greater to improve ability to use RUE when lifting or carrying items during meal preparation/housework/yardwork tasks.   Goal status: IN PROGRESS  5.  Pt will return to highest level of function using RUE as dominant during functional task completion.   Goal status: IN PROGRESS   ASSESSMENT:  CLINICAL IMPRESSION: This session pt continued to report mild popping in abduction. In supine pt demonstrating full A/ROM, however in sitting he continues to be mildly limited around 80-85% of full A/ROM. OT adding strengthening exercises to improve his functional strength and mobility. Verbal and tactile cuing provided for positioning and technique throughout session.   PERFORMANCE DEFICITS: in functional skills including in functional skills including ADLs, IADLs, coordination, tone, ROM, strength, pain, fascial restrictions, muscle spasms, and UE functional use.   PLAN:  OT FREQUENCY: 2x/week  OT DURATION: 8 weeks  PLANNED INTERVENTIONS: 97168 OT Re-evaluation, 97535 self care/ADL training, 02889 therapeutic exercise, 97530 therapeutic activity, 97112 neuromuscular re-education, 97140 manual therapy, 97035 ultrasound, 97010 moist heat, 97032 electrical stimulation (manual), passive range of motion, functional mobility training, energy conservation, coping strategies training, patient/family education, and DME and/or AE instructions  RECOMMENDED OTHER SERVICES: N/A  CONSULTED AND AGREED WITH PLAN OF CARE: Patient  PLAN FOR NEXT SESSION: Manual Therapy, P/ROM, Wall washes, Thumb tacs   Valentin Thelbert LEYLAND [facility] [phone] Valentin Jillyn Thelbert, OT 03/24/2024, 10:37 AM    Managed Medicaid Authorization Request Treatment Start Date: 02-18-24  Visit Dx  Codes: M25.511, M25.611, R29.898  Functional Tool Score: UEFS: 14/80  For all possible CPT codes, reference the Planned Interventions line above.     Check all conditions that are expected to impact treatment: {Conditions expected to impact treatment:None of these apply   If treatment provided at initial evaluation, no treatment charged due to lack of authorization.      "

## 2024-03-25 ENCOUNTER — Ambulatory Visit: Admitting: Orthopedic Surgery

## 2024-03-25 ENCOUNTER — Other Ambulatory Visit: Payer: Self-pay

## 2024-03-25 DIAGNOSIS — Z96611 Presence of right artificial shoulder joint: Secondary | ICD-10-CM | POA: Diagnosis not present

## 2024-03-25 NOTE — Patient Instructions (Signed)
 Previously scheduled appointment on 04/05/24

## 2024-03-25 NOTE — Progress Notes (Unsigned)
 Orthopaedic Postop Note  Assessment: Ronnie Walker is a 74 y.o. male s/p Right Reverse Shoulder Arthroplasty  DOS: 01/14/2024  Plan: [name] is doing well.  He is not having any pain other than following therapy.  He is progressing appropriately.  He is happy with his results.  No issues.  Follow-up in 6 weeks.   Follow-up: No follow-ups on file.  XR at next visit: Right shoulder  Subjective:  Chief Complaint  Patient presents with   Post-op Problem    Pt feels a pop with certain movements, states no pain associated but want to check     History of Present Illness: Ronnie Walker is a 74 y.o. male who presents following the above stated procedure.  Surgery was approximately 6 weeks ago.  He continues work with therapy.  He is not having pain on a regular basis.  He notes some stiffness following therapy.  He is pleased with his results.  He is currently dealing with some mild symptoms associated with the URI.    Review of Systems: No fevers or chills No numbness or tingling No Chest Pain No shortness of breath   Objective: There were no vitals taken for this visit.  Physical Exam:  Alert and oriented, no acute distress  Surgical incision is healed.  No surrounding erythema or drainage.  Sensation intact in axillary nerve distribution.  Active forward flexion to 90 degrees.  Active abduction to 90 degrees.  Passive forward flexion to 160 degrees.  He tolerates approximately 30 degrees of external rotation passively.  IMAGING: I personally ordered and reviewed the following images:  X-rays of the right shoulder were obtained in clinic today.  These are compared to prior x-rays.  No change in overall alignment.  There is no subsidence.  No acute fractures.  Shoulder is reduced.  No bony lesions.  Impression: Stable right reverse shoulder arthroplasty    Ronnie DELENA Horde, MD 03/25/2024 12:09 PM

## 2024-03-27 ENCOUNTER — Encounter: Payer: Self-pay | Admitting: Orthopedic Surgery

## 2024-03-30 ENCOUNTER — Ambulatory Visit (HOSPITAL_COMMUNITY): Admitting: Occupational Therapy

## 2024-03-30 ENCOUNTER — Encounter (HOSPITAL_COMMUNITY): Payer: Self-pay | Admitting: Occupational Therapy

## 2024-03-30 DIAGNOSIS — M25511 Pain in right shoulder: Secondary | ICD-10-CM

## 2024-03-30 DIAGNOSIS — R29898 Other symptoms and signs involving the musculoskeletal system: Secondary | ICD-10-CM

## 2024-03-30 DIAGNOSIS — M25611 Stiffness of right shoulder, not elsewhere classified: Secondary | ICD-10-CM

## 2024-03-30 NOTE — Patient Instructions (Signed)

## 2024-03-30 NOTE — Therapy (Signed)
 " OUTPATIENT OCCUPATIONAL THERAPY ORTHO TREATMENT NOTE  Patient Name: Ronnie Walker MRN: [id number] DOB:[birth year], 74 y.o., male Today's Date: 03/30/2024   END OF SESSION:  OT End of Session - 03/30/24 0949     Visit Number 12    Number of Visits 16    Date for Recertification  04/15/24    Authorization Type BC[employer]    Authorization Time Period no Auth required    OT Start Time 0900    OT Stop Time 0940    OT Time Calculation (min) 40 min    Activity Tolerance Patient tolerated treatment well    Behavior During Therapy [facility] for tasks assessed/performed           Past Medical History:  Diagnosis Date   Arthritis    CVA (cerebral vascular accident) (HCC)    08/2019   Glaucoma    Hyperlipidemia    Past Surgical History:  Procedure Laterality Date   BACK SURGERY     COLONOSCOPY WITH PROPOFOL  N/A 03/27/2020   Procedure: COLONOSCOPY WITH PROPOFOL ;  Surgeon: Cindie Carlin POUR, DO;  [facility];  Service: Endoscopy;  Laterality: N/A;  12:45pm   HYDROCELE EXCISION Right 05/24/2020   Procedure: HYDROCELECTOMY ADULT;  Surgeon: Sherrilee Belvie CROME, MD;  Location: [facility];  Service: Urology;  Laterality: Right;   INGUINAL HERNIA REPAIR Right    POLYPECTOMY  03/27/2020   Procedure: POLYPECTOMY INTESTINAL;  Surgeon: Cindie Carlin POUR, DO;  Location: [facility];  Service: Endoscopy;;   REVERSE SHOULDER ARTHROPLASTY Right 01/14/2024   Procedure: ARTHROPLASTY, SHOULDER, TOTAL, REVERSE;  Surgeon: Onesimo Oneil LABOR, MD;  Location: [facility];  Service: Orthopedics;  Laterality: Right;   VASECTOMY     Patient Active Problem List   Diagnosis Date Noted   Rotator cuff arthropathy of right shoulder 01/14/2024   LRTI (lower respiratory tract infection) 03/29/2021   Preventative health care 02/14/2020   Encounter for screening colonoscopy 02/14/2020   Elevated blood pressure reading    Gastroesophageal reflux disease    Acute ischemic stroke (HCC) 08/23/2019   Hyperlipidemia 02/16/2013    Hyperglycemia 02/16/2013    PCP: Alphonsa Hamilton, MD REFERRING PROVIDER: Onesimo Oneil, MD  ONSET DATE: 01/14/24  REFERRING DIAG:  F24.878 (ICD-10-CM) - Complete tear of right rotator cuff, unspecified whether traumatic  Z96.611 (ICD-10-CM) - Status post reverse arthroplasty of right shoulder    THERAPY DIAG:  Acute pain of right shoulder  Shoulder stiffness, right  Other symptoms and signs involving the musculoskeletal system  Rationale for Evaluation and Treatment: Rehabilitation  SUBJECTIVE:   SUBJECTIVE STATEMENT: I might have over worked it, I am have been trying to get stronger Pt accompanied by: self  PERTINENT HISTORY: Pt s/p R Reverse total shoulder arthroplasty, No significant PMH  PRECAUTIONS: Shoulder  WEIGHT BEARING RESTRICTIONS: Yes >1lbs  PAIN:  Are you having pain? No  FALLS: Has patient fallen in last 6 months? No  PLOF: Independent  PATIENT GOALS: To get my arm over my head  NEXT MD VISIT: 04/07/24  OBJECTIVE:   HAND DOMINANCE: Right  ADLs: Overall ADLs: Pt unable to pull up his jeans or don a belt. He also reports only being able to use L hand for bathing. Pt unable to lift anything at this time for cooking, cleaning , and yard work.   FUNCTIONAL OUTCOME MEASURES: Upper Extremity Functional Scale (UEFS): 14/80   Extreme difficulty/unable (0), Quite a bit of difficulty (1), Moderate difficulty (2), Little difficulty (3), No difficulty (4) Survey  date:  02/09/24  Any of your usual work, household or school activities 0  2. Your usual hobbies, recreational/sport activities 0   3. Lifting a bag of groceries to waist level 0   4. Lifting a bag of groceries above your head 0  5. Grooming your hair 0  6. Pushing up on your hands (I.e. from bathtub or chair) 0  7. Preparing food (I.e. peeling/cutting) 0  8. Driving  2  9. Vacuuming, sweeping, or raking 0  10. Dressing  2  11. Doing up buttons 2  12. Using tools/appliances 0  13.  Opening doors 2  14. Cleaning  1  15. Tying or lacing shoes 0  16. Sleeping  2  17. Laundering clothes (I.e. washing, ironing, folding) 0  18. Opening a jar 3  19. Throwing a ball 0  20. Carrying a small suitcase with your affected limb.  0  Score total:  14/80     UPPER EXTREMITY ROM:       Assessed in supine, er/IR adducted  Passive ROM Right eval  Shoulder flexion 131  Shoulder abduction 124  Shoulder internal rotation 90  Shoulder external rotation 29  (Blank rows = not tested)    UPPER EXTREMITY MMT:     Assessed in seated, er/IR adducted  MMT Right eval  Shoulder flexion   Shoulder abduction   Shoulder internal rotation   Shoulder external rotation   (Blank rows = not tested)  SENSATION: Mild tingling noted periodically  EDEMA: No swelling noted at this time.   OBSERVATIONS: moderate fascial restrictions along the biceps, deltoid, axillary region, and trapezius   TODAY'S TREATMENT:                                                                                                                              DATE:   03/30/24 -Manual Therapy: myofascial release and trigger point applied to biceps, trapezius, and scapular region in order to reduce fascial restrictions and pain, as well as improve ROM.  -A/ROM: supine - flexion, abduction, protraction, horizontal abduction, er/IR, x12 -A/ROM: seated; flexion, abduction, protraction, horizontal abduction, er/IR, x12 (*5% ROM)  -Shoulder stretch: at wall; flexion, corner chest stretch, external rotation with towel, shoulder capsul abduction stretch holding each stretch 30 seconds 2x -Shoulder strength: 3 pound weight, flexion, bicep curl, abduction, shoulder press 10x each  - Functional reach: flexion, abduction reaching into cabinet with 3 pound weight placing on middle shelf and taking down 5x each  -UBE: level 2, 2.5' forwards and backwards   03/24/24 -Manual Therapy: myofascial release and trigger point  applied to biceps, trapezius, and scapular region in order to reduce fascial restrictions and pain, as well as improve ROM.  -A/ROM: supine - flexion, abduction, protraction, horizontal abduction, er/IR, x12 -PNF Strengthening: red band, chest pulls, overhead pulls, PNF up, PNF down, er pulls, x12 -A/ROM: seated - flexion, abduction, protraction, horizontal abduction, er/IR, x10 -X to V arms, x10 -Goal  post arms, x10 -functional reaching: 2# wrist weight, 10 cones from counter to 1st shelf to 2nd shelf, down in abduction -UBE: level 2, 2.5' forwards and backwards  03/22/24 -Manual Therapy: myofascial release and trigger point applied to biceps, trapezius, and scapular region in order to reduce fascial restrictions and pain, as well as improve ROM.  -P/ROM: supine -flexion, abduction, er/IR, x10 -A/ROM: supine - flexion, abduction, protraction, horizontal abduction, er/IR, x12 -Overhead lacing -Theraball Exercises: yellow weighted ball, flexion, protraction, overhead press, V ups, circles both directions, x10    PATIENT EDUCATION: Education details: PNF Strengthening Person educated: Patient Education method: Explanation, Demonstration, and Handouts Education comprehension: verbalized understanding, returned demonstration, and needs further education  HOME EXERCISE PROGRAM: 11/25: Pendulums and Table Slides 12/10: AA/ROM 12/16: A/ROM 12/30: Scapular Strengthening 1/8: PNF Strengthening 1/14: shoulder stretches   GOALS: Goals reviewed with patient? Yes   SHORT TERM GOALS: Target date: 03/10/24  Pt will be provided with and educated on HEP to improve mobility in RUE required for use during ADL completion.   Goal status: IN PROGRESS  2.  Pt will increase RUE P/ROM by 20 degrees to improve ability to use RUE during dressing tasks with minimal compensatory techniques.   Goal status: IN PROGRESS  3.  Pt will increase RUE strength to 3+/5 to improve ability to reach for items at  waist to chest height during bathing and grooming tasks.   Goal status: IN PROGRESS  LONG TERM GOALS: Target date: 04/10/24  Pt will decrease pain in RUE to 3/10 or less to improve ability to sleep for 2+ consecutive hours without waking due to pain.   Goal status: IN PROGRESS  2.  Pt will decrease RUE fascial restrictions to min amounts or less to improve mobility required for functional reaching tasks.   Goal status: IN PROGRESS  3.  Pt will increase RUE A/ROM by 25 degrees to improve ability to use RUE when reaching overhead or behind back during dressing and bathing tasks.   Goal status: IN PROGRESS  4.  Pt will increase RUE strength to 5/5 or greater to improve ability to use RUE when lifting or carrying items during meal preparation/housework/yardwork tasks.   Goal status: IN PROGRESS  5.  Pt will return to highest level of function using RUE as dominant during functional task completion.   Goal status: IN PROGRESS   ASSESSMENT:  CLINICAL IMPRESSION: Pt reports that he had an ortho MD appt last week and that he stated everything was healing as it should- the popping sensation he has been experiencing is normal healing per MD.  Continued seated ROM achieving 85% in all planes. Added in shoulder stretching to loosen up shoulder capsule in order to improve ROM. Continued targeting shoulder strength, added 3 pounds dumb bell exercises. Updated HEP. Pt reports that if MD clears him to go back to work next week he will need to adjust OT treatment times.   PERFORMANCE DEFICITS: in functional skills including in functional skills including ADLs, IADLs, coordination, tone, ROM, strength, pain, fascial restrictions, muscle spasms, and UE functional use.   PLAN:  OT FREQUENCY: 2x/week  OT DURATION: 8 weeks  PLANNED INTERVENTIONS: 97168 OT Re-evaluation, 97535 self care/ADL training, 02889 therapeutic exercise, 97530 therapeutic activity, 97112 neuromuscular re-education, 97140  manual therapy, 97035 ultrasound, 97010 moist heat, 97032 electrical stimulation (manual), passive range of motion, functional mobility training, energy conservation, coping strategies training, patient/family education, and DME and/or AE instructions  RECOMMENDED OTHER SERVICES: N/A  CONSULTED AND  AGREED WITH PLAN OF CARE: Patient  PLAN FOR NEXT SESSION: Manual Therapy, P/ROM, Wall washes, Thumb tacs    [facility] [phone] Chiquita LOISE Sermon, OTR/L 03/30/2024, 9:52 AM    "

## 2024-04-01 ENCOUNTER — Encounter (HOSPITAL_COMMUNITY): Payer: Self-pay | Admitting: Occupational Therapy

## 2024-04-01 ENCOUNTER — Ambulatory Visit (HOSPITAL_COMMUNITY): Admitting: Occupational Therapy

## 2024-04-01 DIAGNOSIS — M25511 Pain in right shoulder: Secondary | ICD-10-CM | POA: Diagnosis not present

## 2024-04-01 DIAGNOSIS — R29898 Other symptoms and signs involving the musculoskeletal system: Secondary | ICD-10-CM

## 2024-04-01 DIAGNOSIS — M25611 Stiffness of right shoulder, not elsewhere classified: Secondary | ICD-10-CM

## 2024-04-01 NOTE — Therapy (Signed)
 " OUTPATIENT OCCUPATIONAL THERAPY ORTHO TREATMENT NOTE  Patient Name: Ronnie Walker MRN: [id number] DOB:1950/09/19, 74 y.o., male Today's Date: 04/01/2024   END OF SESSION:  OT End of Session - 04/01/24 0936     Visit Number 13    Number of Visits 16    Date for Recertification  04/15/24    Authorization Type BCBS    Authorization Time Period no Auth required    OT Start Time 0821    OT Stop Time 0901    OT Time Calculation (min) 40 min    Activity Tolerance Patient tolerated treatment well    Behavior During Therapy [facility] for tasks assessed/performed          Past Medical History:  Diagnosis Date   Arthritis    CVA (cerebral vascular accident) (HCC)    08/2019   Glaucoma    Hyperlipidemia    Past Surgical History:  Procedure Laterality Date   BACK SURGERY     COLONOSCOPY WITH PROPOFOL  N/A 03/27/2020   Procedure: COLONOSCOPY WITH PROPOFOL ;  Surgeon: Cindie Carlin POUR, DO;  [facility];  Service: Endoscopy;  Laterality: N/A;  12:45pm   HYDROCELE EXCISION Right 05/24/2020   Procedure: HYDROCELECTOMY ADULT;  Surgeon: Sherrilee Belvie CROME, MD;  Location: [facility];  Service: Urology;  Laterality: Right;   INGUINAL HERNIA REPAIR Right    POLYPECTOMY  03/27/2020   Procedure: POLYPECTOMY INTESTINAL;  Surgeon: Cindie Carlin POUR, DO;  [facility];  Service: Endoscopy;;   REVERSE SHOULDER ARTHROPLASTY Right 01/14/2024   Procedure: ARTHROPLASTY, SHOULDER, TOTAL, REVERSE;  Surgeon: Onesimo Oneil LABOR, MD;  Location: [facility];  Service: Orthopedics;  Laterality: Right;   VASECTOMY     Patient Active Problem List   Diagnosis Date Noted   Rotator cuff arthropathy of right shoulder 01/14/2024   LRTI (lower respiratory tract infection) 03/29/2021   Preventative health care 02/14/2020   Encounter for screening colonoscopy 02/14/2020   Elevated blood pressure reading    Gastroesophageal reflux disease    Acute ischemic stroke (HCC) 08/23/2019   Hyperlipidemia 02/16/2013    Hyperglycemia 02/16/2013    PCP: Alphonsa Hamilton, MD REFERRING PROVIDER: Onesimo Oneil, MD  ONSET DATE: 01/14/24  REFERRING DIAG:  F24.878 (ICD-10-CM) - Complete tear of right rotator cuff, unspecified whether traumatic  Z96.611 (ICD-10-CM) - Status post reverse arthroplasty of right shoulder    THERAPY DIAG:  Acute pain of right shoulder  Shoulder stiffness, right  Other symptoms and signs involving the musculoskeletal system  Rationale for Evaluation and Treatment: Rehabilitation  SUBJECTIVE:   SUBJECTIVE STATEMENT: It is really really sore today Pt accompanied by: self  PERTINENT HISTORY: Pt s/p R Reverse total shoulder arthroplasty, No significant PMH  PRECAUTIONS: Shoulder  WEIGHT BEARING RESTRICTIONS: Yes >1lbs  PAIN:  Are you having pain? No  FALLS: Has patient fallen in last 6 months? No  PLOF: Independent  PATIENT GOALS: To get my arm over my head  NEXT MD VISIT: 04/07/24  OBJECTIVE:   HAND DOMINANCE: Right  ADLs: Overall ADLs: Pt unable to pull up his jeans or don a belt. He also reports only being able to use L hand for bathing. Pt unable to lift anything at this time for cooking, cleaning , and yard work.   FUNCTIONAL OUTCOME MEASURES: Upper Extremity Functional Scale (UEFS): 14/80   Extreme difficulty/unable (0), Quite a bit of difficulty (1), Moderate difficulty (2), Little difficulty (3), No difficulty (4) Survey date:  02/09/24  Any of your usual work,  household or school activities 0  2. Your usual hobbies, recreational/sport activities 0   3. Lifting a bag of groceries to waist level 0   4. Lifting a bag of groceries above your head 0  5. Grooming your hair 0  6. Pushing up on your hands (I.e. from bathtub or chair) 0  7. Preparing food (I.e. peeling/cutting) 0  8. Driving  2  9. Vacuuming, sweeping, or raking 0  10. Dressing  2  11. Doing up buttons 2  12. Using tools/appliances 0  13. Opening doors 2  14. Cleaning  1  15. Tying  or lacing shoes 0  16. Sleeping  2  17. Laundering clothes (I.e. washing, ironing, folding) 0  18. Opening a jar 3  19. Throwing a ball 0  20. Carrying a small suitcase with your affected limb.  0  Score total:  14/80     UPPER EXTREMITY ROM:       Assessed in supine, er/IR adducted  Passive ROM Right eval  Shoulder flexion 131  Shoulder abduction 124  Shoulder internal rotation 90  Shoulder external rotation 29  (Blank rows = not tested)    UPPER EXTREMITY MMT:     Assessed in seated, er/IR adducted  MMT Right eval  Shoulder flexion   Shoulder abduction   Shoulder internal rotation   Shoulder external rotation   (Blank rows = not tested)  SENSATION: Mild tingling noted periodically  EDEMA: No swelling noted at this time.   OBSERVATIONS: moderate fascial restrictions along the biceps, deltoid, axillary region, and trapezius   TODAY'S TREATMENT:                                                                                                                              DATE:   04/01/24 -Manual Therapy: myofascial release and trigger point applied to biceps, trapezius, and scapular region in order to reduce fascial restrictions and pain, as well as improve ROM.  -P/ROM: supine - flexion, abduction, er/IR, x10 -A/ROM: seated; flexion, abduction, protraction, horizontal abduction, er/IR, x10 -X to V arms, x10 -Goal Post arms, x10 -Scapular Strengthening: green band, extension, retraction, rows, x15 -Shoulder Strengthening: green band, horizontal abduction, er, IR, flexion, abduction, x15 -ABC's on the wall: green weighted ball  03/30/24 -Manual Therapy: myofascial release and trigger point applied to biceps, trapezius, and scapular region in order to reduce fascial restrictions and pain, as well as improve ROM.  -A/ROM: supine - flexion, abduction, protraction, horizontal abduction, er/IR, x12 -A/ROM: seated; flexion, abduction, protraction, horizontal abduction,  er/IR, x12 (*5% ROM)  -Shoulder stretch: at wall; flexion, corner chest stretch, external rotation with towel, shoulder capsul abduction stretch holding each stretch 30 seconds 2x -Shoulder strength: 3 pound weight, flexion, bicep curl, abduction, shoulder press 10x each  - Functional reach: flexion, abduction reaching into cabinet with 3 pound weight placing on middle shelf and taking down 5x each  -UBE: level 2, 2.5' forwards and backwards  03/24/24 -Manual Therapy: myofascial release and trigger point applied to biceps, trapezius, and scapular region in order to reduce fascial restrictions and pain, as well as improve ROM.  -A/ROM: supine - flexion, abduction, protraction, horizontal abduction, er/IR, x12 -PNF Strengthening: red band, chest pulls, overhead pulls, PNF up, PNF down, er pulls, x12 -A/ROM: seated - flexion, abduction, protraction, horizontal abduction, er/IR, x10 -X to V arms, x10 -Goal post arms, x10 -functional reaching: 2# wrist weight, 10 cones from counter to 1st shelf to 2nd shelf, down in abduction -UBE: level 2, 2.5' forwards and backwards   PATIENT EDUCATION: Education details: Public [employer] Account Executive Person educated: Patient Education method: Explanation, Demonstration, and Handouts Education comprehension: verbalized understanding, returned demonstration, and needs further education  HOME EXERCISE PROGRAM: 11/25: Pendulums and Table Slides 12/10: AA/ROM 12/16: A/ROM 12/30: Scapular Strengthening 1/8: PNF Strengthening 1/14: shoulder stretches  1/16: Shoulder Strengthening  GOALS: Goals reviewed with patient? Yes   SHORT TERM GOALS: Target date: 03/10/24  Pt will be provided with and educated on HEP to improve mobility in RUE required for use during ADL completion.   Goal status: IN PROGRESS  2.  Pt will increase RUE P/ROM by 20 degrees to improve ability to use RUE during dressing tasks with minimal compensatory techniques.   Goal status: IN  PROGRESS  3.  Pt will increase RUE strength to 3+/5 to improve ability to reach for items at waist to chest height during bathing and grooming tasks.   Goal status: IN PROGRESS  LONG TERM GOALS: Target date: 04/10/24  Pt will decrease pain in RUE to 3/10 or less to improve ability to sleep for 2+ consecutive hours without waking due to pain.   Goal status: IN PROGRESS  2.  Pt will decrease RUE fascial restrictions to min amounts or less to improve mobility required for functional reaching tasks.   Goal status: IN PROGRESS  3.  Pt will increase RUE A/ROM by 25 degrees to improve ability to use RUE when reaching overhead or behind back during dressing and bathing tasks.   Goal status: IN PROGRESS  4.  Pt will increase RUE strength to 5/5 or greater to improve ability to use RUE when lifting or carrying items during meal preparation/housework/yardwork tasks.   Goal status: IN PROGRESS  5.  Pt will return to highest level of function using RUE as dominant during functional task completion.   Goal status: IN PROGRESS   ASSESSMENT:  CLINICAL IMPRESSION: This session pt reports increased soreness/pain in the biceps and shoulder girdle, most likely over stretching. With passive ROM, he is achieving 90% of full ROM and 75-80% of full ROM actively. With increased exercises and adding resistance band exercises back into the session he is reporting decreased pain/soreness. OT providing verbal and tactile cuing for positioning and technique throughout session.   PERFORMANCE DEFICITS: in functional skills including in functional skills including ADLs, IADLs, coordination, tone, ROM, strength, pain, fascial restrictions, muscle spasms, and UE functional use.   PLAN:  OT FREQUENCY: 2x/week  OT DURATION: 8 weeks  PLANNED INTERVENTIONS: 97168 OT Re-evaluation, 97535 self care/ADL training, 02889 therapeutic exercise, 97530 therapeutic activity, 97112 neuromuscular re-education, 97140 manual  therapy, 97035 ultrasound, 97010 moist heat, 97032 electrical stimulation (manual), passive range of motion, functional mobility training, energy conservation, coping strategies training, patient/family education, and DME and/or AE instructions  RECOMMENDED OTHER SERVICES: N/A  CONSULTED AND AGREED WITH PLAN OF CARE: Patient  PLAN FOR NEXT SESSION: Manual Therapy, P/ROM, Wall washes, Thumb tacs  Ronnie Walker, OTR/L [facility] [phone] Ronnie Walker, OTR/L 04/01/2024, 9:38 AM    "

## 2024-04-01 NOTE — Patient Instructions (Signed)

## 2024-04-04 ENCOUNTER — Encounter (HOSPITAL_COMMUNITY): Payer: Self-pay | Admitting: Occupational Therapy

## 2024-04-04 ENCOUNTER — Ambulatory Visit (HOSPITAL_COMMUNITY): Admitting: Occupational Therapy

## 2024-04-04 DIAGNOSIS — M25511 Pain in right shoulder: Secondary | ICD-10-CM

## 2024-04-04 DIAGNOSIS — R29898 Other symptoms and signs involving the musculoskeletal system: Secondary | ICD-10-CM

## 2024-04-04 DIAGNOSIS — M25611 Stiffness of right shoulder, not elsewhere classified: Secondary | ICD-10-CM

## 2024-04-04 NOTE — Therapy (Signed)
 " OUTPATIENT OCCUPATIONAL THERAPY ORTHO TREATMENT NOTE  Patient Name: Ronnie Walker MRN: [id number] DOB:1951/01/03, 74 y.o., male Today's Date: 04/04/2024   END OF SESSION:  OT End of Session - 04/04/24 0917     Visit Number 14    Number of Visits 16    Date for Recertification  04/15/24    Authorization Type BCBS    Authorization Time Period no Auth required    OT Start Time 0820    OT Stop Time 0902    OT Time Calculation (min) 42 min    Activity Tolerance Patient tolerated treatment well    Behavior During Therapy [facility] for tasks assessed/performed          Past Medical History:  Diagnosis Date   Arthritis    CVA (cerebral vascular accident) (HCC)    08/2019   Glaucoma    Hyperlipidemia    Past Surgical History:  Procedure Laterality Date   BACK SURGERY     COLONOSCOPY WITH PROPOFOL  N/A 03/27/2020   Procedure: COLONOSCOPY WITH PROPOFOL ;  Surgeon: Cindie Carlin POUR, DO;  Location: [facility];  Service: Endoscopy;  Laterality: N/A;  12:45pm   HYDROCELE EXCISION Right 05/24/2020   Procedure: HYDROCELECTOMY ADULT;  Surgeon: Sherrilee Belvie CROME, MD;  Location: [facility];  Service: Urology;  Laterality: Right;   INGUINAL HERNIA REPAIR Right    POLYPECTOMY  03/27/2020   Procedure: POLYPECTOMY INTESTINAL;  Surgeon: Cindie Carlin POUR, DO;  Location: [facility];  Service: Endoscopy;;   REVERSE SHOULDER ARTHROPLASTY Right 01/14/2024   Procedure: ARTHROPLASTY, SHOULDER, TOTAL, REVERSE;  Surgeon: Onesimo Oneil LABOR, MD;  Location: [facility];  Service: Orthopedics;  Laterality: Right;   VASECTOMY     Patient Active Problem List   Diagnosis Date Noted   Rotator cuff arthropathy of right shoulder 01/14/2024   LRTI (lower respiratory tract infection) 03/29/2021   Preventative health care 02/14/2020   Encounter for screening colonoscopy 02/14/2020   Elevated blood pressure reading    Gastroesophageal reflux disease    Acute ischemic stroke (HCC) 08/23/2019   Hyperlipidemia 02/16/2013    Hyperglycemia 02/16/2013    PCP: Alphonsa Hamilton, MD REFERRING PROVIDER: Onesimo Oneil, MD  ONSET DATE: 01/14/24  REFERRING DIAG:  F24.878 (ICD-10-CM) - Complete tear of right rotator cuff, unspecified whether traumatic  Z96.611 (ICD-10-CM) - Status post reverse arthroplasty of right shoulder    THERAPY DIAG:  Acute pain of right shoulder  Shoulder stiffness, right  Other symptoms and signs involving the musculoskeletal system  Rationale for Evaluation and Treatment: Rehabilitation  SUBJECTIVE:   SUBJECTIVE STATEMENT: Not stretching as much has helped a lot. Pt accompanied by: self  PERTINENT HISTORY: Pt s/p R Reverse total shoulder arthroplasty, No significant PMH  PRECAUTIONS: Shoulder  WEIGHT BEARING RESTRICTIONS: Yes >1lbs  PAIN:  Are you having pain? No  FALLS: Has patient fallen in last 6 months? No  PLOF: Independent  PATIENT GOALS: To get my arm over my head  NEXT MD VISIT: 04/07/24  OBJECTIVE:   HAND DOMINANCE: Right  ADLs: Overall ADLs: Pt unable to pull up his jeans or don a belt. He also reports only being able to use L hand for bathing. Pt unable to lift anything at this time for cooking, cleaning , and yard work.   FUNCTIONAL OUTCOME MEASURES: Upper Extremity Functional Scale (UEFS): 14/80   Extreme difficulty/unable (0), Quite a bit of difficulty (1), Moderate difficulty (2), Little difficulty (3), No difficulty (4) Survey date:  02/09/24  Any of your  usual work, household or school activities 0  2. Your usual hobbies, recreational/sport activities 0   3. Lifting a bag of groceries to waist level 0   4. Lifting a bag of groceries above your head 0  5. Grooming your hair 0  6. Pushing up on your hands (I.e. from bathtub or chair) 0  7. Preparing food (I.e. peeling/cutting) 0  8. Driving  2  9. Vacuuming, sweeping, or raking 0  10. Dressing  2  11. Doing up buttons 2  12. Using tools/appliances 0  13. Opening doors 2  14. Cleaning  1   15. Tying or lacing shoes 0  16. Sleeping  2  17. Laundering clothes (I.e. washing, ironing, folding) 0  18. Opening a jar 3  19. Throwing a ball 0  20. Carrying a small suitcase with your affected limb.  0  Score total:  14/80     UPPER EXTREMITY ROM:       Assessed in supine, er/IR adducted  Passive ROM Right eval  Shoulder flexion 131  Shoulder abduction 124  Shoulder internal rotation 90  Shoulder external rotation 29  (Blank rows = not tested)    UPPER EXTREMITY MMT:     Assessed in seated, er/IR adducted  MMT Right eval  Shoulder flexion   Shoulder abduction   Shoulder internal rotation   Shoulder external rotation   (Blank rows = not tested)  SENSATION: Mild tingling noted periodically  EDEMA: No swelling noted at this time.   OBSERVATIONS: moderate fascial restrictions along the biceps, deltoid, axillary region, and trapezius   TODAY'S TREATMENT:                                                                                                                              DATE:   04/04/24 -Manual Therapy: myofascial release and trigger point applied to biceps, trapezius, and scapular region in order to reduce fascial restrictions and pain, as well as improve ROM.  -P/ROM: supine - flexion, abduction, er/IR, x10 -A/ROM: seated; 2#, flexion, abduction, protraction, horizontal abduction, er/IR, x12 -X to V arms, 2#, x12 -Goal Post arms, 2#, x12 -Proximal Shoulder Exercises: 1#, paddles, criss cross, circles both directions, x10 each -Overhead lacing, 1# wrist weight -Pull downs, 20#, x15 -simulating climbing a ladder. -UBE: level 3, 2.5' forwards and backwards  04/01/24 -Manual Therapy: myofascial release and trigger point applied to biceps, trapezius, and scapular region in order to reduce fascial restrictions and pain, as well as improve ROM.  -P/ROM: supine - flexion, abduction, er/IR, x10 -A/ROM: seated; flexion, abduction, protraction, horizontal  abduction, er/IR, x10 -X to V arms, x10 -Goal Post arms, x10 -Scapular Strengthening: green band, extension, retraction, rows, x15 -Shoulder Strengthening: green band, horizontal abduction, er, IR, flexion, abduction, x15 -ABC's on the wall: green weighted ball  03/30/24 -Manual Therapy: myofascial release and trigger point applied to biceps, trapezius, and scapular region in order to reduce fascial restrictions and pain, as  well as improve ROM.  -A/ROM: supine - flexion, abduction, protraction, horizontal abduction, er/IR, x12 -A/ROM: seated; flexion, abduction, protraction, horizontal abduction, er/IR, x12 (*5% ROM)  -Shoulder stretch: at wall; flexion, corner chest stretch, external rotation with towel, shoulder capsul abduction stretch holding each stretch 30 seconds 2x -Shoulder strength: 3 pound weight, flexion, bicep curl, abduction, shoulder press 10x each  - Functional reach: flexion, abduction reaching into cabinet with 3 pound weight placing on middle shelf and taking down 5x each  -UBE: level 2, 2.5' forwards and backwards  03/24/24 -Manual Therapy: myofascial release and trigger point applied to biceps, trapezius, and scapular region in order to reduce fascial restrictions and pain, as well as improve ROM.  -A/ROM: supine - flexion, abduction, protraction, horizontal abduction, er/IR, x12 -PNF Strengthening: red band, chest pulls, overhead pulls, PNF up, PNF down, er pulls, x12 -A/ROM: seated - flexion, abduction, protraction, horizontal abduction, er/IR, x10 -X to V arms, x10 -Goal post arms, x10 -functional reaching: 2# wrist weight, 10 cones from counter to 1st shelf to 2nd shelf, down in abduction -UBE: level 2, 2.5' forwards and backwards   PATIENT EDUCATION: Education details: Public [employer] Account Executive Person educated: Patient Education method: Explanation, Demonstration, and Handouts Education comprehension: verbalized understanding, returned demonstration, and needs  further education  HOME EXERCISE PROGRAM: 11/25: Pendulums and Table Slides 12/10: AA/ROM 12/16: A/ROM 12/30: Scapular Strengthening 1/8: PNF Strengthening 1/14: shoulder stretches  1/16: Shoulder Strengthening  GOALS: Goals reviewed with patient? Yes   SHORT TERM GOALS: Target date: 03/10/24  Pt will be provided with and educated on HEP to improve mobility in RUE required for use during ADL completion.   Goal status: IN PROGRESS  2.  Pt will increase RUE P/ROM by 20 degrees to improve ability to use RUE during dressing tasks with minimal compensatory techniques.   Goal status: IN PROGRESS  3.  Pt will increase RUE strength to 3+/5 to improve ability to reach for items at waist to chest height during bathing and grooming tasks.   Goal status: IN PROGRESS  LONG TERM GOALS: Target date: 04/10/24  Pt will decrease pain in RUE to 3/10 or less to improve ability to sleep for 2+ consecutive hours without waking due to pain.   Goal status: IN PROGRESS  2.  Pt will decrease RUE fascial restrictions to min amounts or less to improve mobility required for functional reaching tasks.   Goal status: IN PROGRESS  3.  Pt will increase RUE A/ROM by 25 degrees to improve ability to use RUE when reaching overhead or behind back during dressing and bathing tasks.   Goal status: IN PROGRESS  4.  Pt will increase RUE strength to 5/5 or greater to improve ability to use RUE when lifting or carrying items during meal preparation/housework/yardwork tasks.   Goal status: IN PROGRESS  5.  Pt will return to highest level of function using RUE as dominant during functional task completion.   Goal status: IN PROGRESS   ASSESSMENT:  CLINICAL IMPRESSION: Pt having decreased soreness and pain this session. His ROM is improving to functional range with light weights, however he reports feeling overall weak. He required several short rest breaks throughout the session, but was able to complete  all tasks. OT providing verbal and tactile cuing for positioning and technique throughout session.   PERFORMANCE DEFICITS: in functional skills including in functional skills including ADLs, IADLs, coordination, tone, ROM, strength, pain, fascial restrictions, muscle spasms, and UE functional use.   PLAN:  OT FREQUENCY:  2x/week  OT DURATION: 8 weeks  PLANNED INTERVENTIONS: 97168 OT Re-evaluation, 97535 self care/ADL training, 02889 therapeutic exercise, 97530 therapeutic activity, 97112 neuromuscular re-education, 97140 manual therapy, 97035 ultrasound, 97010 moist heat, 97032 electrical stimulation (manual), passive range of motion, functional mobility training, energy conservation, coping strategies training, patient/family education, and DME and/or AE instructions  RECOMMENDED OTHER SERVICES: N/A  CONSULTED AND AGREED WITH PLAN OF CARE: Patient  PLAN FOR NEXT SESSION: Manual Therapy, P/ROM, Wall washes, Thumb tacs   Valentin Nightingale, OTR/L [facility] [phone] Breona Cherubin Jillyn Nightingale, OTR/L 04/04/2024, 9:18 AM    "

## 2024-04-05 ENCOUNTER — Ambulatory Visit: Admitting: Orthopedic Surgery

## 2024-04-05 ENCOUNTER — Encounter: Payer: Self-pay | Admitting: Orthopedic Surgery

## 2024-04-05 ENCOUNTER — Ambulatory Visit (HOSPITAL_COMMUNITY): Admitting: Occupational Therapy

## 2024-04-05 DIAGNOSIS — M75121 Complete rotator cuff tear or rupture of right shoulder, not specified as traumatic: Secondary | ICD-10-CM

## 2024-04-05 DIAGNOSIS — Z96611 Presence of right artificial shoulder joint: Secondary | ICD-10-CM

## 2024-04-05 NOTE — Progress Notes (Unsigned)
 Orthopaedic Postop Note  Assessment: Ronnie Walker is a 74 y.o. male s/p Right Reverse Shoulder Arthroplasty  DOS: 01/14/2024  Plan: Mr. Mintzer is progressing appropriately.  Surgery was less than 3 months ago.  He has excellent range of motion, and good strength at this time.  However, I do not think he is ready to return to full duties at work.  I think you will continue to have issues with repetitive lifting and other similar activities necessary to complete all tasks.  As such, I recommend he remain out of work for an additional 6 weeks.  I provided him with documentation.  We will provide appropriate information for his short-term disability company.  If he needed any else, he will contact the clinic.  Otherwise, I will see him in 6 weeks  Follow-up: Return in about 6 weeks (around 05/17/2024).  XR at next visit: Right shoulder  Subjective:  Chief Complaint  Patient presents with   Routine Post Op    R TSA DOS 01/14/24    History of Present Illness: Ronnie Walker is a 74 y.o. male who presents following the above stated procedure.  Surgery was almost 3 months ago.  He is doing well overall.  He has occasional popping in the right shoulder.  However, this is not painful.  He continues to work with therapy.  Occasionally, he has some aching pains after therapy.  Otherwise, he is pleased with his progress thus far.    Review of Systems: No fevers or chills No numbness or tingling No Chest Pain No shortness of breath   Objective: There were no vitals taken for this visit.  Physical Exam:  Alert and oriented, no acute distress  Surgical incision is healed.  No surrounding erythema or drainage.  Sensation intact in axillary nerve distribution.  Active forward flexion to 150 degrees.  Active abduction to 100 degrees.  He tolerates approximately 30 degrees of external rotation.  IR to back pocket.  Popping with overhead rotation.  4+ supraspinatus strength.  Negative belly press.   No pain with external rotation to side.  Mild weakness is appreciated.  IMAGING: I personally ordered and reviewed the following images:  No new imaging obtained today.   Oneil DELENA Horde, MD 04/05/2024 2:00 PM

## 2024-04-05 NOTE — Patient Instructions (Signed)
 Note for work - no repeated lifting anything greater than 5 lbs

## 2024-04-06 ENCOUNTER — Encounter (HOSPITAL_COMMUNITY): Payer: Self-pay | Admitting: Occupational Therapy

## 2024-04-06 ENCOUNTER — Ambulatory Visit (HOSPITAL_COMMUNITY): Admitting: Occupational Therapy

## 2024-04-06 ENCOUNTER — Encounter: Payer: Self-pay | Admitting: Orthopedic Surgery

## 2024-04-06 DIAGNOSIS — M25511 Pain in right shoulder: Secondary | ICD-10-CM

## 2024-04-06 DIAGNOSIS — R29898 Other symptoms and signs involving the musculoskeletal system: Secondary | ICD-10-CM

## 2024-04-06 DIAGNOSIS — M25611 Stiffness of right shoulder, not elsewhere classified: Secondary | ICD-10-CM

## 2024-04-06 NOTE — Therapy (Signed)
 " OUTPATIENT OCCUPATIONAL THERAPY ORTHO TREATMENT NOTE  Patient Name: Ronnie Walker MRN: [id number] DOB:Nov 17, 1950, 74 y.o., male Today's Date: 04/06/2024   END OF SESSION:  OT End of Session - 04/06/24 0903     Visit Number 15    Number of Visits 16    Date for Recertification  04/15/24    Authorization Type [employer]BS    Authorization Time Period no Auth required    OT Start Time 0819    OT Stop Time 0858    OT Time Calculation (min) 39 min    Activity Tolerance Patient tolerated treatment well    Behavior During Therapy [facility] for tasks assessed/performed          Past Medical History:  Diagnosis Date   Arthritis    CVA (cerebral vascular accident) (HCC)    08/2019   Glaucoma    Hyperlipidemia    Past Surgical History:  Procedure Laterality Date   BACK SURGERY     COLONOSCOPY WITH PROPOFOL  N/A 03/27/2020   Procedure: COLONOSCOPY WITH PROPOFOL ;  Surgeon: Cindie Carlin POUR, DO;  [facility];  Service: Endoscopy;  Laterality: N/A;  12:45pm   HYDROCELE EXCISION Right 05/24/2020   Procedure: HYDROCELECTOMY ADULT;  Surgeon: Sherrilee Belvie CROME, MD;  Location: [facility];  Service: Urology;  Laterality: Right;   INGUINAL HERNIA REPAIR Right    POLYPECTOMY  03/27/2020   Procedure: POLYPECTOMY INTESTINAL;  Surgeon: Cindie Carlin POUR, DO;  Location: [facility];  Service: Endoscopy;;   REVERSE SHOULDER ARTHROPLASTY Right 01/14/2024   Procedure: ARTHROPLASTY, SHOULDER, TOTAL, REVERSE;  Surgeon: Onesimo Oneil LABOR, MD;  Location: [facility];  Service: Orthopedics;  Laterality: Right;   VASECTOMY     Patient Active Problem List   Diagnosis Date Noted   Rotator cuff arthropathy of right shoulder 01/14/2024   LRTI (lower respiratory tract infection) 03/29/2021   Preventative health care 02/14/2020   Encounter for screening colonoscopy 02/14/2020   Elevated blood pressure reading    Gastroesophageal reflux disease    Acute ischemic stroke (HCC) 08/23/2019   Hyperlipidemia 02/16/2013    Hyperglycemia 02/16/2013    PCP: Alphonsa Hamilton, MD REFERRING PROVIDER: Onesimo Oneil, MD  ONSET DATE: 01/14/24  REFERRING DIAG:  F24.878 (ICD-10-CM) - Complete tear of right rotator cuff, unspecified whether traumatic  Z96.611 (ICD-10-CM) - Status post reverse arthroplasty of right shoulder    THERAPY DIAG:  Acute pain of right shoulder  Shoulder stiffness, right  Other symptoms and signs involving the musculoskeletal system  Rationale for Evaluation and Treatment: Rehabilitation  SUBJECTIVE:   SUBJECTIVE STATEMENT: The doctor didn't release me to go back to work. Pt accompanied by: self  PERTINENT HISTORY: Pt s/p R Reverse total shoulder arthroplasty, No significant PMH  PRECAUTIONS: Shoulder  WEIGHT BEARING RESTRICTIONS: Yes 5lbs  PAIN:  Are you having pain? No  FALLS: Has patient fallen in last 6 months? No  PLOF: Independent  PATIENT GOALS: To get my arm over my head  NEXT MD VISIT: 04/07/24  OBJECTIVE:   HAND DOMINANCE: Right  ADLs: Overall ADLs: Pt unable to pull up his jeans or don a belt. He also reports only being able to use L hand for bathing. Pt unable to lift anything at this time for cooking, cleaning , and yard work.   FUNCTIONAL OUTCOME MEASURES: Upper Extremity Functional Scale (UEFS): 14/80   Extreme difficulty/unable (0), Quite a bit of difficulty (1), Moderate difficulty (2), Little difficulty (3), No difficulty (4) Survey date:  02/09/24  Any  of your usual work, household or school activities 0  2. Your usual hobbies, recreational/sport activities 0   3. Lifting a bag of groceries to waist level 0   4. Lifting a bag of groceries above your head 0  5. Grooming your hair 0  6. Pushing up on your hands (I.e. from bathtub or chair) 0  7. Preparing food (I.e. peeling/cutting) 0  8. Driving  2  9. Vacuuming, sweeping, or raking 0  10. Dressing  2  11. Doing up buttons 2  12. Using tools/appliances 0  13. Opening doors 2  14.  Cleaning  1  15. Tying or lacing shoes 0  16. Sleeping  2  17. Laundering clothes (I.e. washing, ironing, folding) 0  18. Opening a jar 3  19. Throwing a ball 0  20. Carrying a small suitcase with your affected limb.  0  Score total:  14/80     UPPER EXTREMITY ROM:       Assessed in supine, er/IR adducted  Passive ROM Right eval  Shoulder flexion 131  Shoulder abduction 124  Shoulder internal rotation 90  Shoulder external rotation 29  (Blank rows = not tested)    UPPER EXTREMITY MMT:     Assessed in seated, er/IR adducted  MMT Right eval  Shoulder flexion   Shoulder abduction   Shoulder internal rotation   Shoulder external rotation   (Blank rows = not tested)  SENSATION: Mild tingling noted periodically  EDEMA: No swelling noted at this time.   OBSERVATIONS: moderate fascial restrictions along the biceps, deltoid, axillary region, and trapezius   TODAY'S TREATMENT:                                                                                                                              DATE:   04/06/24 -Manual Therapy: myofascial release and trigger point applied to biceps, trapezius, and scapular region in order to reduce fascial restrictions and pain, as well as improve ROM.  -A/ROM: seated; 2#, flexion, abduction, protraction, horizontal abduction, er/IR, x12 -X to V arms, 2#, x12 -Goal Post arms, 2#, x12 -Tidal tank:   -bicep curl lifts, x15  -walk and carry, x2' -Body Blade:  -vertical flexion, x30  -vertical abduction, x30  -horizontal flexion, x30  -horizontal abduction, x30  -scaption, x5 -Tidal ball Farmers carry, 2x30 -Pull downs: 30# x12, 40# x8  04/04/24 -Manual Therapy: myofascial release and trigger point applied to biceps, trapezius, and scapular region in order to reduce fascial restrictions and pain, as well as improve ROM.  -P/ROM: supine - flexion, abduction, er/IR, x10 -A/ROM: seated; 2#, flexion, abduction, protraction,  horizontal abduction, er/IR, x12 -X to V arms, 2#, x12 -Goal Post arms, 2#, x12 -Proximal Shoulder Exercises: 1#, paddles, criss cross, circles both directions, x10 each -Overhead lacing, 1# wrist weight -Pull downs, 20#, x15 -simulating climbing a ladder. -UBE: level 3, 2.5' forwards and backwards  04/01/24 -Manual Therapy: myofascial release and trigger  point applied to biceps, trapezius, and scapular region in order to reduce fascial restrictions and pain, as well as improve ROM.  -P/ROM: supine - flexion, abduction, er/IR, x10 -A/ROM: seated; flexion, abduction, protraction, horizontal abduction, er/IR, x10 -X to V arms, x10 -Goal Post arms, x10 -Scapular Strengthening: green band, extension, retraction, rows, x15 -Shoulder Strengthening: green band, horizontal abduction, er, IR, flexion, abduction, x15 -ABC's on the wall: green weighted ball   PATIENT EDUCATION: Education details: Public [employer] Account Executive Person educated: Patient Education method: Explanation, Demonstration, and Handouts Education comprehension: verbalized understanding, returned demonstration, and needs further education  HOME EXERCISE PROGRAM: 11/25: Pendulums and Table Slides 12/10: AA/ROM 12/16: A/ROM 12/30: Scapular Strengthening 1/8: PNF Strengthening 1/14: shoulder stretches  1/16: Shoulder Strengthening  GOALS: Goals reviewed with patient? Yes   SHORT TERM GOALS: Target date: 03/10/24  Pt will be provided with and educated on HEP to improve mobility in RUE required for use during ADL completion.   Goal status: IN PROGRESS  2.  Pt will increase RUE P/ROM by 20 degrees to improve ability to use RUE during dressing tasks with minimal compensatory techniques.   Goal status: IN PROGRESS  3.  Pt will increase RUE strength to 3+/5 to improve ability to reach for items at waist to chest height during bathing and grooming tasks.   Goal status: IN PROGRESS  LONG TERM GOALS: Target date:  04/10/24  Pt will decrease pain in RUE to 3/10 or less to improve ability to sleep for 2+ consecutive hours without waking due to pain.   Goal status: IN PROGRESS  2.  Pt will decrease RUE fascial restrictions to min amounts or less to improve mobility required for functional reaching tasks.   Goal status: IN PROGRESS  3.  Pt will increase RUE A/ROM by 25 degrees to improve ability to use RUE when reaching overhead or behind back during dressing and bathing tasks.   Goal status: IN PROGRESS  4.  Pt will increase RUE strength to 5/5 or greater to improve ability to use RUE when lifting or carrying items during meal preparation/housework/yardwork tasks.   Goal status: IN PROGRESS  5.  Pt will return to highest level of function using RUE as dominant during functional task completion.   Goal status: IN PROGRESS   ASSESSMENT:  CLINICAL IMPRESSION: This session pt continues to work on building up his strength and overall activity tolerance. He continues to have fatigue with repetitive tasks, despite light weight, however with time and rest he can complete each movement and task. ROM appears to be functional with continued improvements noted, with strength continuing to slowly improve as well. OT adding more functional tasks to simulate things he does at home and in his yard. Verbal and visual cuing for form and technique.   PERFORMANCE DEFICITS: in functional skills including in functional skills including ADLs, IADLs, coordination, tone, ROM, strength, pain, fascial restrictions, muscle spasms, and UE functional use.   PLAN:  OT FREQUENCY: 2x/week  OT DURATION: 8 weeks  PLANNED INTERVENTIONS: 97168 OT Re-evaluation, 97535 self care/ADL training, 02889 therapeutic exercise, 97530 therapeutic activity, 97112 neuromuscular re-education, 97140 manual therapy, 97035 ultrasound, 97010 moist heat, 97032 electrical stimulation (manual), passive range of motion, functional mobility training,  energy conservation, coping strategies training, patient/family education, and DME and/or AE instructions  RECOMMENDED OTHER SERVICES: N/A  CONSULTED AND AGREED WITH PLAN OF CARE: Patient  PLAN FOR NEXT SESSION: Manual Therapy, P/ROM, Wall washes, Thumb tacs   Khristen Cheyney Thelbert, OTR/L [facility]  [phone] Ameri Cahoon Jillyn Nightingale, OTR/L 04/06/2024, 9:04 AM    "

## 2024-04-11 ENCOUNTER — Ambulatory Visit (HOSPITAL_COMMUNITY): Admitting: Occupational Therapy

## 2024-04-13 ENCOUNTER — Ambulatory Visit (HOSPITAL_COMMUNITY): Admitting: Occupational Therapy

## 2024-04-13 ENCOUNTER — Encounter (HOSPITAL_COMMUNITY): Payer: Self-pay | Admitting: Occupational Therapy

## 2024-04-13 DIAGNOSIS — R29898 Other symptoms and signs involving the musculoskeletal system: Secondary | ICD-10-CM

## 2024-04-13 DIAGNOSIS — M25611 Stiffness of right shoulder, not elsewhere classified: Secondary | ICD-10-CM

## 2024-04-13 DIAGNOSIS — M25511 Pain in right shoulder: Secondary | ICD-10-CM | POA: Diagnosis not present

## 2024-04-13 NOTE — Therapy (Signed)
 " OUTPATIENT OCCUPATIONAL THERAPY ORTHO TREATMENT NOTE REASSESSMENT/RECERTIFICATION  Patient Name: Ronnie Walker MRN: [id number] DOB:1950-12-20, 74 y.o., male Today's Date: 04/13/2024  Progress Note Reporting Period 02/08/25 to 04/13/24  See note below for Objective Data and Assessment of Progress/Goals.    END OF SESSION:  OT End of Session - 04/13/24 1118     Visit Number 16    Number of Visits 24    Date for Recertification  04/15/24    Authorization Type BCBS    Authorization Time Period no Auth required    OT Start Time [phone]    OT Stop Time 1032    OT Time Calculation (min) 44 min    Activity Tolerance Patient tolerated treatment well    Behavior During Therapy [facility] for tasks assessed/performed          Past Medical History:  Diagnosis Date   Arthritis    CVA (cerebral vascular accident) (HCC)    08/2019   Glaucoma    Hyperlipidemia    Past Surgical History:  Procedure Laterality Date   BACK SURGERY     COLONOSCOPY WITH PROPOFOL  N/A 03/27/2020   Procedure: COLONOSCOPY WITH PROPOFOL ;  Surgeon: Cindie Carlin POUR, DO;  [facility];  Service: Endoscopy;  Laterality: N/A;  12:45pm   HYDROCELE EXCISION Right 05/24/2020   Procedure: HYDROCELECTOMY ADULT;  Surgeon: Sherrilee Belvie CROME, MD;  Location: [facility];  Service: Urology;  Laterality: Right;   INGUINAL HERNIA REPAIR Right    POLYPECTOMY  03/27/2020   Procedure: POLYPECTOMY INTESTINAL;  Surgeon: Cindie Carlin POUR, DO;  [facility];  Service: Endoscopy;;   REVERSE SHOULDER ARTHROPLASTY Right 01/14/2024   Procedure: ARTHROPLASTY, SHOULDER, TOTAL, REVERSE;  Surgeon: Onesimo Oneil LABOR, MD;  Location: [facility];  Service: Orthopedics;  Laterality: Right;   VASECTOMY     Patient Active Problem List   Diagnosis Date Noted   Rotator cuff arthropathy of right shoulder 01/14/2024   LRTI (lower respiratory tract infection) 03/29/2021   Preventative health care 02/14/2020   Encounter for screening colonoscopy  02/14/2020   Elevated blood pressure reading    Gastroesophageal reflux disease    Acute ischemic stroke (HCC) 08/23/2019   Hyperlipidemia 02/16/2013   Hyperglycemia 02/16/2013    PCP: Alphonsa Hamilton, MD REFERRING PROVIDER: Onesimo Oneil, MD  ONSET DATE: 01/14/24  REFERRING DIAG:  F24.878 (ICD-10-CM) - Complete tear of right rotator cuff, unspecified whether traumatic  Z96.611 (ICD-10-CM) - Status post reverse arthroplasty of right shoulder    THERAPY DIAG:  Acute pain of right shoulder  Shoulder stiffness, right  Other symptoms and signs involving the musculoskeletal system  Rationale for Evaluation and Treatment: Rehabilitation  SUBJECTIVE:   SUBJECTIVE STATEMENT: It has been aching and very tight with all this cold weather Pt accompanied by: self  PERTINENT HISTORY: Pt s/p R Reverse total shoulder arthroplasty, No significant PMH  PRECAUTIONS: Shoulder  WEIGHT BEARING RESTRICTIONS: Yes 5lbs  PAIN:  Are you having pain? No  FALLS: Has patient fallen in last 6 months? No  PLOF: Independent  PATIENT GOALS: To get my arm over my head  NEXT MD VISIT: 04/07/24  OBJECTIVE:   HAND DOMINANCE: Right  ADLs: Overall ADLs: Pt unable to pull up his jeans or don a belt. He also reports only being able to use L hand for bathing. Pt unable to lift anything at this time for cooking, cleaning , and yard work.   FUNCTIONAL OUTCOME MEASURES: Upper Extremity Functional Scale (UEFS): 14/80   Extreme  difficulty/unable (0), Quite a bit of difficulty (1), Moderate difficulty (2), Little difficulty (3), No difficulty (4) Survey date:  02/09/24 04/13/24  Any of your usual work, household or school activities 0 3  2. Your usual hobbies, recreational/sport activities 0 3   3. Lifting a bag of groceries to waist level 0 4   4. Lifting a bag of groceries above your head 0 2  5. Grooming your hair 0 4  6. Pushing up on your hands (I.e. from bathtub or chair) 0 4  7. Preparing  food (I.e. peeling/cutting) 0 4  8. Driving  2 4  9. Vacuuming, sweeping, or raking 0 4  10. Dressing  2 4  11. Doing up buttons 2 4  12. Using tools/appliances 0 3  13. Opening doors 2 4  14. Cleaning  1 4  15. Tying or lacing shoes 0 4  16. Sleeping  2 4  17. Laundering clothes (I.e. washing, ironing, folding) 0 4  18. Opening a jar 3 3  19. Throwing a ball 0 2  20. Carrying a small suitcase with your affected limb.  0 3  Score total:  14/80      UPPER EXTREMITY ROM:       Assessed in supine, er/IR adducted  Passive ROM Right eval Right 04/13/24  Shoulder flexion 131 144  Shoulder abduction 124 151  Shoulder internal rotation 90 90  Shoulder external rotation 29 74  (Blank rows = not tested)    Assessed in sitting, er/IR adducted  Active ROM Right eval Right 04/13/24  Shoulder flexion  134  Shoulder abduction  153  Shoulder internal rotation  90  Shoulder external rotation  50  (Blank rows = not tested)  UPPER EXTREMITY MMT:     Assessed in seated, er/IR adducted  MMT Right eval Right 04/13/24  Shoulder flexion  4+/5  Shoulder abduction  4/5  Shoulder internal rotation  4/5  Shoulder external rotation  4-/5  (Blank rows = not tested)  SENSATION: Mild tingling noted periodically  EDEMA: No swelling noted at this time.   OBSERVATIONS: moderate fascial restrictions along the biceps, deltoid, axillary region, and trapezius   TODAY'S TREATMENT:                                                                                                                              DATE:   04/13/24 -Manual Therapy: myofascial release and trigger point applied to biceps, trapezius, and scapular region in order to reduce fascial restrictions and pain, as well as improve ROM.  -A/ROM: seated; 2#, flexion, abduction, protraction, horizontal abduction, er/IR, x15 -X to V arms, 2#, x15 -Goal Post arms, 2#, x15 -Reviewed shoulder and scapular strengthening with updated green  band -REASSESSMENT  04/06/24 -Manual Therapy: myofascial release and trigger point applied to biceps, trapezius, and scapular region in order to reduce fascial restrictions and pain, as well as improve ROM.  -A/ROM: seated; 2#, flexion, abduction,  protraction, horizontal abduction, er/IR, x12 -X to V arms, 2#, x12 -Goal Post arms, 2#, x12 -Tidal tank:   -bicep curl lifts, x15  -walk and carry, x2' -Body Blade:  -vertical flexion, x30  -vertical abduction, x30  -horizontal flexion, x30  -horizontal abduction, x30  -scaption, x5 -Tidal ball Farmers carry, 2x30 -Pull downs: 30# x12, 40# x8  04/04/24 -Manual Therapy: myofascial release and trigger point applied to biceps, trapezius, and scapular region in order to reduce fascial restrictions and pain, as well as improve ROM.  -P/ROM: supine - flexion, abduction, er/IR, x10 -A/ROM: seated; 2#, flexion, abduction, protraction, horizontal abduction, er/IR, x12 -X to V arms, 2#, x12 -Goal Post arms, 2#, x12 -Proximal Shoulder Exercises: 1#, paddles, criss cross, circles both directions, x10 each -Overhead lacing, 1# wrist weight -Pull downs, 20#, x15 -simulating climbing a ladder. -UBE: level 3, 2.5' forwards and backwards   PATIENT EDUCATION: Education details: Continue HEP Person educated: Patient Education method: Explanation, Demonstration, and Handouts Education comprehension: verbalized understanding, returned demonstration, and needs further education  HOME EXERCISE PROGRAM: 11/25: Pendulums and Table Slides 12/10: AA/ROM 12/16: A/ROM 12/30: Scapular Strengthening 1/8: PNF Strengthening 1/14: shoulder stretches  1/16: Shoulder Strengthening  GOALS: Goals reviewed with patient? Yes   SHORT TERM GOALS: Target date: 03/10/24  Pt will be provided with and educated on HEP to improve mobility in RUE required for use during ADL completion.   Goal status: IN PROGRESS  2.  Pt will increase RUE P/ROM by 20 degrees to  improve ability to use RUE during dressing tasks with minimal compensatory techniques.   Goal status: IN PROGRESS  3.  Pt will increase RUE strength to 3+/5 to improve ability to reach for items at waist to chest height during bathing and grooming tasks.   Goal status: IN PROGRESS  LONG TERM GOALS: Target date: 04/10/24  Pt will decrease pain in RUE to 3/10 or less to improve ability to sleep for 2+ consecutive hours without waking due to pain.   Goal status: IN PROGRESS  2.  Pt will decrease RUE fascial restrictions to min amounts or less to improve mobility required for functional reaching tasks.   Goal status: IN PROGRESS  3.  Pt will increase RUE A/ROM by 25 degrees to improve ability to use RUE when reaching overhead or behind back during dressing and bathing tasks.   Goal status: IN PROGRESS  4.  Pt will increase RUE strength to 5/5 or greater to improve ability to use RUE when lifting or carrying items during meal preparation/housework/yardwork tasks.   Goal status: IN PROGRESS  5.  Pt will return to highest level of function using RUE as dominant during functional task completion.   Goal status: IN PROGRESS   ASSESSMENT:  CLINICAL IMPRESSION: Pt completed reassessment this session for recertification. He demonstrated good improvements with his ROM and strength. He continues to have mild limitations and weakness, however these are incrementally improving. Pt continues to fatigue with added weights and resistance, requiring short rest breaks, as well as cuing to slow down. OT providing verbal and tactile cuing for positioning and technique.   PERFORMANCE DEFICITS: in functional skills including in functional skills including ADLs, IADLs, coordination, tone, ROM, strength, pain, fascial restrictions, muscle spasms, and UE functional use.   PLAN:  OT FREQUENCY: 2x/week  OT DURATION: 8 weeks  PLANNED INTERVENTIONS: 97168 OT Re-evaluation, 97535 self care/ADL training,  97110 therapeutic exercise, 97530 therapeutic activity, 97112 neuromuscular re-education, 97140 manual therapy, 97035 ultrasound, 02989 moist heat,  02967 electrical stimulation (manual), passive range of motion, functional mobility training, energy conservation, coping strategies training, patient/family education, and DME and/or AE instructions  RECOMMENDED OTHER SERVICES: N/A  CONSULTED AND AGREED WITH PLAN OF CARE: Patient  PLAN FOR NEXT SESSION: Manual Therapy, P/ROM, Wall washes, Thumb tacs   Valentin Nightingale, OTR/L [facility] [phone] Cathline Dowen Jillyn Nightingale, OTR/L 04/13/2024, 11:20 AM    "

## 2024-04-19 ENCOUNTER — Encounter (HOSPITAL_COMMUNITY): Payer: Self-pay | Admitting: Occupational Therapy

## 2024-04-19 ENCOUNTER — Ambulatory Visit (HOSPITAL_COMMUNITY): Admitting: Occupational Therapy

## 2024-04-19 DIAGNOSIS — M25511 Pain in right shoulder: Secondary | ICD-10-CM

## 2024-04-19 DIAGNOSIS — R29898 Other symptoms and signs involving the musculoskeletal system: Secondary | ICD-10-CM

## 2024-04-19 DIAGNOSIS — M25611 Stiffness of right shoulder, not elsewhere classified: Secondary | ICD-10-CM

## 2024-04-20 ENCOUNTER — Telehealth: Payer: Self-pay | Admitting: Orthopedic Surgery

## 2024-04-20 NOTE — Telephone Encounter (Signed)
 Dr. Onesimo pt - spoke w/Sarah w/[employer] [phone], she stated that this pt had surgery on 01/14/24 and approval was not obtained.  She wants to speak to someone that would get the authorization so that she can help them through the step to see if they can get this approved.

## 2024-04-21 ENCOUNTER — Ambulatory Visit (HOSPITAL_COMMUNITY): Admitting: Occupational Therapy

## 2024-04-21 ENCOUNTER — Encounter (HOSPITAL_COMMUNITY): Payer: Self-pay | Admitting: Occupational Therapy

## 2024-04-21 DIAGNOSIS — M25511 Pain in right shoulder: Secondary | ICD-10-CM

## 2024-04-21 DIAGNOSIS — M25611 Stiffness of right shoulder, not elsewhere classified: Secondary | ICD-10-CM

## 2024-04-21 DIAGNOSIS — R29898 Other symptoms and signs involving the musculoskeletal system: Secondary | ICD-10-CM

## 2024-04-21 NOTE — Therapy (Signed)
 " OUTPATIENT OCCUPATIONAL THERAPY ORTHO TREATMENT NOTE  Patient Name: MANAV PIEROTTI MRN: [id number] DOB:[birth year], 74 y.o., male Today's Date: 04/21/2024   END OF SESSION:  OT End of Session - 04/21/24 1046     Visit Number 18    Number of Visits 24    Date for Recertification  04/15/24    Authorization Type BCBS    Authorization Time Period no Auth required    OT Start Time [phone]    OT Stop Time 1027    OT Time Calculation (min) 40 min    Activity Tolerance Patient tolerated treatment well    Behavior During Therapy [facility] for tasks assessed/performed          Past Medical History:  Diagnosis Date   Arthritis    CVA (cerebral vascular accident) (HCC)    08/2019   Glaucoma    Hyperlipidemia    Past Surgical History:  Procedure Laterality Date   BACK SURGERY     COLONOSCOPY WITH PROPOFOL  N/A 03/27/2020   Procedure: COLONOSCOPY WITH PROPOFOL ;  Surgeon: Cindie Carlin POUR, DO;  [facility];  Service: Endoscopy;  Laterality: N/A;  12:45pm   HYDROCELE EXCISION Right 05/24/2020   Procedure: HYDROCELECTOMY ADULT;  Surgeon: Sherrilee Belvie CROME, MD;  Location: [facility];  Service: Urology;  Laterality: Right;   INGUINAL HERNIA REPAIR Right    POLYPECTOMY  03/27/2020   Procedure: POLYPECTOMY INTESTINAL;  Surgeon: Cindie Carlin POUR, DO;  [facility];  Service: Endoscopy;;   REVERSE SHOULDER ARTHROPLASTY Right 01/14/2024   Procedure: ARTHROPLASTY, SHOULDER, TOTAL, REVERSE;  Surgeon: Onesimo Oneil LABOR, MD;  Location: [facility];  Service: Orthopedics;  Laterality: Right;   VASECTOMY     Patient Active Problem List   Diagnosis Date Noted   Rotator cuff arthropathy of right shoulder 01/14/2024   LRTI (lower respiratory tract infection) 03/29/2021   Preventative health care 02/14/2020   Encounter for screening colonoscopy 02/14/2020   Elevated blood pressure reading    Gastroesophageal reflux disease    Acute ischemic stroke (HCC) 08/23/2019   Hyperlipidemia 02/16/2013    Hyperglycemia 02/16/2013    PCP: Alphonsa Hamilton, MD REFERRING PROVIDER: Onesimo Oneil, MD  ONSET DATE: 01/14/24  REFERRING DIAG:  F24.878 (ICD-10-CM) - Complete tear of right rotator cuff, unspecified whether traumatic  Z96.611 (ICD-10-CM) - Status post reverse arthroplasty of right shoulder    THERAPY DIAG:  Acute pain of right shoulder  Shoulder stiffness, right  Other symptoms and signs involving the musculoskeletal system  Rationale for Evaluation and Treatment: Rehabilitation  SUBJECTIVE:   SUBJECTIVE STATEMENT: I'm real stiff this morning Pt accompanied by: self  PERTINENT HISTORY: Pt s/p R Reverse total shoulder arthroplasty, No significant PMH  PRECAUTIONS: Shoulder  WEIGHT BEARING RESTRICTIONS: Yes 5lbs  PAIN:  Are you having pain? No  FALLS: Has patient fallen in last 6 months? No  PLOF: Independent  PATIENT GOALS: To get my arm over my head  NEXT MD VISIT: 04/07/24  OBJECTIVE:   HAND DOMINANCE: Right  ADLs: Overall ADLs: Pt unable to pull up his jeans or don a belt. He also reports only being able to use L hand for bathing. Pt unable to lift anything at this time for cooking, cleaning , and yard work.   FUNCTIONAL OUTCOME MEASURES: Upper Extremity Functional Scale (UEFS): 14/80   Extreme difficulty/unable (0), Quite a bit of difficulty (1), Moderate difficulty (2), Little difficulty (3), No difficulty (4) Survey date:  02/09/24 04/13/24  Any of your usual work,  household or school activities 0 3  2. Your usual hobbies, recreational/sport activities 0 3   3. Lifting a bag of groceries to waist level 0 4   4. Lifting a bag of groceries above your head 0 2  5. Grooming your hair 0 4  6. Pushing up on your hands (I.e. from bathtub or chair) 0 4  7. Preparing food (I.e. peeling/cutting) 0 4  8. Driving  2 4  9. Vacuuming, sweeping, or raking 0 4  10. Dressing  2 4  11. Doing up buttons 2 4  12. Using tools/appliances 0 3  13. Opening doors 2 4   14. Cleaning  1 4  15. Tying or lacing shoes 0 4  16. Sleeping  2 4  17. Laundering clothes (I.e. washing, ironing, folding) 0 4  18. Opening a jar 3 3  19. Throwing a ball 0 2  20. Carrying a small suitcase with your affected limb.  0 3  Score total:  14/80      UPPER EXTREMITY ROM:       Assessed in supine, er/IR adducted  Passive ROM Right eval Right 04/13/24  Shoulder flexion 131 144  Shoulder abduction 124 151  Shoulder internal rotation 90 90  Shoulder external rotation 29 74  (Blank rows = not tested)    Assessed in sitting, er/IR adducted  Active ROM Right eval Right 04/13/24  Shoulder flexion  134  Shoulder abduction  153  Shoulder internal rotation  90  Shoulder external rotation  50  (Blank rows = not tested)  UPPER EXTREMITY MMT:     Assessed in seated, er/IR adducted  MMT Right eval Right 04/13/24  Shoulder flexion  4+/5  Shoulder abduction  4/5  Shoulder internal rotation  4/5  Shoulder external rotation  4-/5  (Blank rows = not tested)  SENSATION: Mild tingling noted periodically  EDEMA: No swelling noted at this time.   OBSERVATIONS: moderate fascial restrictions along the biceps, deltoid, axillary region, and trapezius   TODAY'S TREATMENT:                                                                                                                              DATE:   04/21/24 -Manual Therapy: myofascial release and trigger point applied to biceps, trapezius, and scapular region in order to reduce fascial restrictions and pain, as well as improve ROM.  -A/ROM: seated; 2#, flexion, abduction, protraction, horizontal abduction, er/IR, x15 -X to V arms, 2#, x15 -Goal Post arms, 2#, x15 -Scapular Strengthening: green band, extension, retraction, rows, x15 -Shoulder Strengthening: green band, horizontal abduction, er, IR, flexion, abduction, x15 -Farmers Carry by side: 25# tidal tank ball x2' -Tidal Tank ball, 25#, lift and place over head,  x10 -Pull downs, 2x10 50# -Lifting 50# box and placing on shoulder height box x8 -Overhead work: screwing and unscrewing nuts from the bolts, x10  04/19/24 -Manual Therapy: myofascial release and trigger point applied to  biceps, trapezius, and scapular region in order to reduce fascial restrictions and pain, as well as improve ROM.  -A/ROM: seated; 2#, flexion, abduction, protraction, horizontal abduction, er/IR, x15 -X to V arms, 2#, x15 -Goal Post arms, 2#, x15 -Functional reaching: 2# wrist weight, 10 cones from counter to 1st shelf to 2nd shelf to 3rd shelf in flexion, down in abduction -Overhead holds, 3#, 1x30, 1x45 -Pull downs, 2x10 40# -UBE: level 3, pace 6.0+, 2.5' forwards and backwards  04/13/24 -Manual Therapy: myofascial release and trigger point applied to biceps, trapezius, and scapular region in order to reduce fascial restrictions and pain, as well as improve ROM.  -A/ROM: seated; 2#, flexion, abduction, protraction, horizontal abduction, er/IR, x15 -X to V arms, 2#, x15 -Goal Post arms, 2#, x15 -Reviewed shoulder and scapular strengthening with updated green band -REASSESSMENT    PATIENT EDUCATION: Education details: Continue HEP Person educated: Patient Education method: Explanation, Demonstration, and Handouts Education comprehension: verbalized understanding, returned demonstration, and needs further education  HOME EXERCISE PROGRAM: 11/25: Pendulums and Table Slides 12/10: AA/ROM 12/16: A/ROM 12/30: Scapular Strengthening 1/8: PNF Strengthening 1/14: shoulder stretches  1/16: Shoulder Strengthening  GOALS: Goals reviewed with patient? Yes   SHORT TERM GOALS: Target date: 03/10/24  Pt will be provided with and educated on HEP to improve mobility in RUE required for use during ADL completion.   Goal status: IN PROGRESS  2.  Pt will increase RUE P/ROM by 20 degrees to improve ability to use RUE during dressing tasks with minimal compensatory  techniques.   Goal status: IN PROGRESS  3.  Pt will increase RUE strength to 3+/5 to improve ability to reach for items at waist to chest height during bathing and grooming tasks.   Goal status: IN PROGRESS  LONG TERM GOALS: Target date: 04/10/24  Pt will decrease pain in RUE to 3/10 or less to improve ability to sleep for 2+ consecutive hours without waking due to pain.   Goal status: IN PROGRESS  2.  Pt will decrease RUE fascial restrictions to min amounts or less to improve mobility required for functional reaching tasks.   Goal status: IN PROGRESS  3.  Pt will increase RUE A/ROM by 25 degrees to improve ability to use RUE when reaching overhead or behind back during dressing and bathing tasks.   Goal status: IN PROGRESS  4.  Pt will increase RUE strength to 5/5 or greater to improve ability to use RUE when lifting or carrying items during meal preparation/housework/yardwork tasks.   Goal status: IN PROGRESS  5.  Pt will return to highest level of function using RUE as dominant during functional task completion.   Goal status: IN PROGRESS   ASSESSMENT:  CLINICAL IMPRESSION: Pt reported increased stiffness this session, however demonstrated continued improvements in his overall ROM and strength. He tolerated all exercises well with short rest breaks throughout session. OT increased weights and resistance bands this session, no pain noted, only slight discomfort due to weight. Verbal and tactile cuing provided for positioning and technique throughout session.   PERFORMANCE DEFICITS: in functional skills including in functional skills including ADLs, IADLs, coordination, tone, ROM, strength, pain, fascial restrictions, muscle spasms, and UE functional use.   PLAN:  OT FREQUENCY: 2x/week  OT DURATION: 8 weeks  PLANNED INTERVENTIONS: 97168 OT Re-evaluation, 97535 self care/ADL training, 02889 therapeutic exercise, 97530 therapeutic activity, 97112 neuromuscular re-education,  97140 manual therapy, 97035 ultrasound, 97010 moist heat, 97032 electrical stimulation (manual), passive range of motion, functional mobility training, energy  conservation, coping strategies training, patient/family education, and DME and/or AE instructions  RECOMMENDED OTHER SERVICES: N/A  CONSULTED AND AGREED WITH PLAN OF CARE: Patient  PLAN FOR NEXT SESSION: Manual Therapy, P/ROM, Wall washes, Thumb tacs   Valentin Nightingale, OTR/L [facility] [phone] Leo Weyandt Jillyn Nightingale, OTR/L 04/21/2024, 10:48 AM  "

## 2024-04-25 ENCOUNTER — Ambulatory Visit (HOSPITAL_COMMUNITY): Admitting: Occupational Therapy

## 2024-05-02 ENCOUNTER — Ambulatory Visit (HOSPITAL_COMMUNITY): Admitting: Occupational Therapy

## 2024-05-04 ENCOUNTER — Ambulatory Visit (HOSPITAL_COMMUNITY): Admitting: Occupational Therapy

## 2024-05-09 ENCOUNTER — Ambulatory Visit (HOSPITAL_COMMUNITY): Admitting: Occupational Therapy

## 2024-05-11 ENCOUNTER — Ambulatory Visit (HOSPITAL_COMMUNITY): Admitting: Occupational Therapy

## 2024-05-17 ENCOUNTER — Ambulatory Visit (HOSPITAL_COMMUNITY): Admitting: Occupational Therapy

## 2024-05-18 ENCOUNTER — Ambulatory Visit: Admitting: Orthopedic Surgery

## 2024-08-16 ENCOUNTER — Ambulatory Visit: Admitting: Family Medicine
